# Patient Record
Sex: Female | Born: 1950 | Race: White | Hispanic: No | Smoking: Former smoker
Health system: Southern US, Community
[De-identification: ages and names within clinical notes are randomized; demographics above are authoritative.]

## PROBLEM LIST (undated history)

## (undated) DIAGNOSIS — R519 Headache, unspecified: Secondary | ICD-10-CM

## (undated) DIAGNOSIS — F419 Anxiety disorder, unspecified: Secondary | ICD-10-CM

## (undated) DIAGNOSIS — T753XXA Motion sickness, initial encounter: Secondary | ICD-10-CM

## (undated) DIAGNOSIS — C55 Malignant neoplasm of uterus, part unspecified: Secondary | ICD-10-CM

## (undated) DIAGNOSIS — R51 Headache: Secondary | ICD-10-CM

## (undated) DIAGNOSIS — F329 Major depressive disorder, single episode, unspecified: Secondary | ICD-10-CM

## (undated) DIAGNOSIS — G4730 Sleep apnea, unspecified: Secondary | ICD-10-CM

## (undated) DIAGNOSIS — R42 Dizziness and giddiness: Secondary | ICD-10-CM

## (undated) DIAGNOSIS — K649 Unspecified hemorrhoids: Secondary | ICD-10-CM

## (undated) DIAGNOSIS — F32A Depression, unspecified: Secondary | ICD-10-CM

## (undated) DIAGNOSIS — Z9289 Personal history of other medical treatment: Secondary | ICD-10-CM

## (undated) DIAGNOSIS — E119 Type 2 diabetes mellitus without complications: Secondary | ICD-10-CM

## (undated) DIAGNOSIS — K219 Gastro-esophageal reflux disease without esophagitis: Secondary | ICD-10-CM

## (undated) DIAGNOSIS — E042 Nontoxic multinodular goiter: Secondary | ICD-10-CM

## (undated) DIAGNOSIS — E7800 Pure hypercholesterolemia, unspecified: Secondary | ICD-10-CM

## (undated) DIAGNOSIS — D649 Anemia, unspecified: Secondary | ICD-10-CM

## (undated) HISTORY — PX: HERNIA REPAIR: SHX51

## (undated) HISTORY — PX: LAPAROSCOPIC SMALL BOWEL RESECTION: SUR793

## (undated) HISTORY — PX: VAGINAL HYSTERECTOMY: SUR661

## (undated) HISTORY — PX: BACK SURGERY: SHX140

## (undated) HISTORY — DX: Type 2 diabetes mellitus without complications: E11.9

## (undated) HISTORY — DX: Anxiety disorder, unspecified: F41.9

## (undated) HISTORY — PX: TONSILLECTOMY: SUR1361

## (undated) HISTORY — PX: ABDOMINAL HERNIA REPAIR: SHX539

## (undated) HISTORY — DX: Depression, unspecified: F32.A

## (undated) HISTORY — DX: Major depressive disorder, single episode, unspecified: F32.9

## (undated) HISTORY — PX: COLECTOMY: SHX59

---
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
## 1998-03-08 HISTORY — PX: GASTRIC BYPASS: SHX52

## 2003-12-18 ENCOUNTER — Emergency Department: Payer: Self-pay | Admitting: General Practice

## 2006-10-11 ENCOUNTER — Ambulatory Visit: Payer: Self-pay | Admitting: Gastroenterology

## 2008-03-08 HISTORY — PX: LUMBAR DISC SURGERY: SHX700

## 2011-05-20 ENCOUNTER — Emergency Department: Payer: Self-pay | Admitting: *Deleted

## 2011-05-20 LAB — CBC
MCH: 23.3 pg — ABNORMAL LOW (ref 26.0–34.0)
MCHC: 31.7 g/dL — ABNORMAL LOW (ref 32.0–36.0)
MCV: 73 fL — ABNORMAL LOW (ref 80–100)
Platelet: 280 10*3/uL (ref 150–440)
RBC: 5.04 10*6/uL (ref 3.80–5.20)
RDW: 17.4 % — ABNORMAL HIGH (ref 11.5–14.5)
WBC: 7.9 10*3/uL (ref 3.6–11.0)

## 2011-05-21 LAB — COMPREHENSIVE METABOLIC PANEL
Albumin: 3.7 g/dL (ref 3.4–5.0)
Alkaline Phosphatase: 92 U/L (ref 50–136)
BUN: 7 mg/dL (ref 7–18)
Bilirubin,Total: 0.5 mg/dL (ref 0.2–1.0)
Creatinine: 0.62 mg/dL (ref 0.60–1.30)
Glucose: 126 mg/dL — ABNORMAL HIGH (ref 65–99)
SGPT (ALT): 18 U/L
Total Protein: 8.2 g/dL (ref 6.4–8.2)

## 2011-06-12 ENCOUNTER — Ambulatory Visit: Payer: Self-pay | Admitting: Internal Medicine

## 2013-09-17 ENCOUNTER — Observation Stay: Payer: Self-pay | Admitting: Internal Medicine

## 2013-09-17 LAB — BASIC METABOLIC PANEL
Anion Gap: 6 — ABNORMAL LOW (ref 7–16)
BUN: 8 mg/dL (ref 7–18)
CALCIUM: 8.3 mg/dL — AB (ref 8.5–10.1)
CHLORIDE: 108 mmol/L — AB (ref 98–107)
Co2: 25 mmol/L (ref 21–32)
Creatinine: 0.63 mg/dL (ref 0.60–1.30)
EGFR (Non-African Amer.): >60
Glucose: 78 mg/dL (ref 65–99)
OSMOLALITY: 275 (ref 275–301)
Potassium: 4.2 mmol/L (ref 3.5–5.1)
SODIUM: 139 mmol/L (ref 136–145)

## 2013-09-17 LAB — CBC WITH DIFFERENTIAL/PLATELET
BASOS ABS: 0 10*3/uL (ref 0.0–0.1)
Basophil %: 0.3 %
Eosinophil #: 0.1 10*3/uL (ref 0.0–0.7)
Eosinophil %: 2 %
HCT: 36.4 % (ref 35.0–47.0)
HGB: 11.3 g/dL — AB (ref 12.0–16.0)
Lymphocyte #: 2.2 10*3/uL (ref 1.0–3.6)
Lymphocyte %: 31.1 %
MCH: 23.5 pg — ABNORMAL LOW (ref 26.0–34.0)
MCHC: 31.1 g/dL — ABNORMAL LOW (ref 32.0–36.0)
MCV: 76 fL — ABNORMAL LOW (ref 80–100)
Monocyte #: 0.6 x10 3/mm (ref 0.2–0.9)
Monocyte %: 8.3 %
NEUTROS PCT: 58.3 %
Neutrophil #: 4.2 10*3/uL (ref 1.4–6.5)
PLATELETS: 271 10*3/uL (ref 150–440)
RBC: 4.81 10*6/uL (ref 3.80–5.20)
RDW: 17.3 % — ABNORMAL HIGH (ref 11.5–14.5)
WBC: 7.2 10*3/uL (ref 3.6–11.0)

## 2013-09-17 LAB — TROPONIN I

## 2013-09-24 ENCOUNTER — Encounter: Payer: Self-pay | Admitting: Internal Medicine

## 2013-09-25 ENCOUNTER — Ambulatory Visit: Payer: Self-pay | Admitting: Family Medicine

## 2013-09-25 DIAGNOSIS — R262 Difficulty in walking, not elsewhere classified: Secondary | ICD-10-CM | POA: Insufficient documentation

## 2013-09-25 DIAGNOSIS — R42 Dizziness and giddiness: Secondary | ICD-10-CM | POA: Insufficient documentation

## 2013-09-25 DIAGNOSIS — H538 Other visual disturbances: Secondary | ICD-10-CM | POA: Insufficient documentation

## 2013-09-25 DIAGNOSIS — R200 Anesthesia of skin: Secondary | ICD-10-CM | POA: Insufficient documentation

## 2013-09-25 DIAGNOSIS — R51 Headache: Secondary | ICD-10-CM

## 2013-09-25 DIAGNOSIS — R270 Ataxia, unspecified: Secondary | ICD-10-CM | POA: Insufficient documentation

## 2013-09-25 DIAGNOSIS — M542 Cervicalgia: Secondary | ICD-10-CM | POA: Insufficient documentation

## 2013-09-25 DIAGNOSIS — R519 Headache, unspecified: Secondary | ICD-10-CM | POA: Insufficient documentation

## 2013-10-06 ENCOUNTER — Encounter: Payer: Self-pay | Admitting: Internal Medicine

## 2013-10-08 ENCOUNTER — Ambulatory Visit: Payer: Self-pay | Admitting: Family Medicine

## 2013-10-15 DIAGNOSIS — F341 Dysthymic disorder: Secondary | ICD-10-CM | POA: Insufficient documentation

## 2013-10-15 DIAGNOSIS — F32A Depression, unspecified: Secondary | ICD-10-CM | POA: Insufficient documentation

## 2013-10-15 DIAGNOSIS — E119 Type 2 diabetes mellitus without complications: Secondary | ICD-10-CM | POA: Insufficient documentation

## 2013-10-15 DIAGNOSIS — F329 Major depressive disorder, single episode, unspecified: Secondary | ICD-10-CM | POA: Insufficient documentation

## 2013-11-06 ENCOUNTER — Encounter: Payer: Self-pay | Admitting: Internal Medicine

## 2014-01-14 DIAGNOSIS — E042 Nontoxic multinodular goiter: Secondary | ICD-10-CM | POA: Insufficient documentation

## 2014-01-31 ENCOUNTER — Emergency Department: Payer: Self-pay | Admitting: Emergency Medicine

## 2014-02-01 LAB — URINALYSIS, COMPLETE
BILIRUBIN, UR: NEGATIVE
BLOOD: NEGATIVE
Bacteria: NONE SEEN
GLUCOSE, UR: NEGATIVE mg/dL (ref 0–75)
KETONE: NEGATIVE
LEUKOCYTE ESTERASE: NEGATIVE
Nitrite: NEGATIVE
PH: 6 (ref 4.5–8.0)
Protein: NEGATIVE
Specific Gravity: 1.014 (ref 1.003–1.030)

## 2014-02-01 LAB — CBC WITH DIFFERENTIAL/PLATELET
Basophil #: 0.1 10*3/uL (ref 0.0–0.1)
Basophil %: 0.6 %
Eosinophil #: 0.1 10*3/uL (ref 0.0–0.7)
Eosinophil %: 0.7 %
HCT: 38.9 % (ref 35.0–47.0)
HGB: 12.2 g/dL (ref 12.0–16.0)
Lymphocyte #: 0.4 10*3/uL — ABNORMAL LOW (ref 1.0–3.6)
Lymphocyte %: 3.4 %
MCH: 25.4 pg — AB (ref 26.0–34.0)
MCHC: 31.4 g/dL — AB (ref 32.0–36.0)
MCV: 81 fL (ref 80–100)
Monocyte #: 0.7 x10 3/mm (ref 0.2–0.9)
Monocyte %: 6.4 %
NEUTROS PCT: 88.9 %
Neutrophil #: 10 10*3/uL — ABNORMAL HIGH (ref 1.4–6.5)
Platelet: 216 10*3/uL (ref 150–440)
RBC: 4.8 10*6/uL (ref 3.80–5.20)
RDW: 14.4 % (ref 11.5–14.5)
WBC: 11.2 10*3/uL — ABNORMAL HIGH (ref 3.6–11.0)

## 2014-02-01 LAB — COMPREHENSIVE METABOLIC PANEL
ANION GAP: 10 (ref 7–16)
Albumin: 3.5 g/dL (ref 3.4–5.0)
Alkaline Phosphatase: 81 U/L
BUN: 13 mg/dL (ref 7–18)
Bilirubin,Total: 0.7 mg/dL (ref 0.2–1.0)
CALCIUM: 8.5 mg/dL (ref 8.5–10.1)
CO2: 23 mmol/L (ref 21–32)
CREATININE: 0.87 mg/dL (ref 0.60–1.30)
Chloride: 107 mmol/L (ref 98–107)
EGFR (Non-African Amer.): >60
Glucose: 146 mg/dL — ABNORMAL HIGH (ref 65–99)
OSMOLALITY: 282 (ref 275–301)
POTASSIUM: 3.5 mmol/L (ref 3.5–5.1)
SGOT(AST): 19 U/L (ref 15–37)
SGPT (ALT): 22 U/L
SODIUM: 140 mmol/L (ref 136–145)
TOTAL PROTEIN: 7 g/dL (ref 6.4–8.2)

## 2014-02-01 LAB — LIPASE, BLOOD: Lipase: 128 U/L (ref 73–393)

## 2014-06-29 NOTE — H&P (Signed)
PATIENT NAME:  Brooke, Liu MR#:  [id number] DATE OF BIRTH:  [birth year]  DATE OF ADMISSION:  09/17/2013  PRIMARY CARE PHYSICIAN:  Rochel Brome, MD, at [facility] practice.    CHIEF COMPLAINT: Unsteady gait, headache and dizziness.   HISTORY OF PRESENT ILLNESS: Brooke Liu is 64 year old Caucasian female with history of diabetes and mild depression, comes to the Emergency Room after she started having increasing unsteady gait, dizziness and severe headache after she had some dental work done due to her cavity today. The patient states she has been getting worked up as outpatient for headaches which have been there for 3 months along with some tinnitus in both her ears. She was also evaluated by ENT and was supposed to get MRI as outpatient, however, has not had a chance to do it. In the meantime, the patient started having pain in her tooth and she underwent some dental surgery today for tooth extraction today. The patient reports very wobbly, unsteady, was hitting into the walls at home when she was trying to walk. She was brought to the Emergency Room and complained of significant headache. She is hemodynamically stable. She is being admitted for further evaluation of her headache and ataxia.   ALLERGIES: INTRAVENOUS PYELOGRAM DYE.   PAST MEDICAL HISTORY:   1.  Diabetes.  2.  Headache for the last 3 months.  3.  Tinnitus for the last 2 to 3 months as well.  4.  Mild depression.     SOCIAL HISTORY: Nonsmoker, works as a Surveyor, minerals. Negative for alcohol or any drug use.   MEDICATIONS:  1.  Valium 5 mg p.o. daily as needed.  2.  Metformin 500 mg b.i.d.  3.  Lexapro 20 mg p.o. daily.   FAMILY HISTORY: Positive for diabetes, hypertension and cerebrovascular accident.  REVIEW OF SYSTEMS:   CONSTITUTIONAL: No fever. Positive for fatigue, weakness.  EYES: No blurred or double vision. No glaucoma or cataract.  EARS, NOSE, THROAT: Positive for tinnitus. No ear pain, hearing loss  or discharge.  RESPIRATORY: No cough, wheeze, hemoptysis or dyspnea.  CARDIOVASCULAR: No chest pain, orthopnea, edema or arrhythmia.  GASTROINTESTINAL: No nausea, vomiting, diarrhea, abdominal pain. No gastroesophageal reflux disease.  GENITOURINARY: No dysuria, hematuria or frequency.  ENDOCRINE: No polyuria, nocturia or thyroid problems.  HEMATOLOGY: No anemia, easy bruising or bleeding.  MUSCULOSKELETAL: Positive for arthritis.  NEUROLOGICAL:  Positive for ataxia and vertigo symptoms along with headache.  PSYCHIATRIC: No anxiety or bipolar disorder.  All other systems reviewed and negative.    PHYSICAL EXAMINATION: GENERAL: The patient is awake, alert, oriented x 3. She has mild to moderate distress due to headache.  VITAL SIGNS: Afebrile. Pulse is 64, blood pressure is 151/75, sats are 99% on room air.  HEENT: Atraumatic, normocephalic. Pupils are equal, round and reactive to light and accommodation, no nystagmus. EOM intact. Oral mucosa is moist.  NECK: Supple. No JVD. No carotid bruit.  RESPIRATORY:  Clear to auscultation bilaterally. No rales, rhonchi, respiratory distress or labored breathing.  CARDIOVASCULAR: Both heart sounds are normal. Rate, rhythm regular. PMI not lateralized. Chest nontender.  EXTREMITIES: Good pedal pulses, good femoral pulses. No lower extremity edema.  ABDOMEN: Soft, benign, nontender. No organomegaly. Positive bowel sounds.  NEUROLOGIC: Grossly intact cranial nerves II through XII. No motor or sensory deficit. The patient does have ataxic gait. I did not make her walk; however, was told by ER physician. Reflexes 1+ in both upper and lower extremities, plantars downgoing.  SKIN:  Warm and dry.  PSYCHIATRIC: The patient is awake, alert, oriented x 3.   LABORATORY, DIAGNOSTIC AND RADIOLOGICAL DATA:   1.  White count is 7.2, hemoglobin and hematocrit is 11.3 and 36.4.  2.  CT head is no specific intracranial abnormality to explain patient's symptoms,  degenerative spurring of both mandibular condyles.  3.  Glucose is 78, BUN 8, creatinine 0.63, sodium is 139, potassium is 4.2, chloride is 108, bicarbonate is 25. Troponin is less than 0.02.  4.  EKG shows normal sinus rhythm, low-voltage QRS.   ASSESSMENT: A 64 year old, Brooke Liu, with history of diabetes and mild depression, comes in with:  1.  Ataxic gait, headache and tinnitus. Differential includes vertigo versus possible small cerebellar stroke. The patient does have risk factors for cerebrovascular accident. I will hold off on giving aspirin since she had some dental work and tooth pulled out, however, we can start baby aspirin from tomorrow. Will give meclizine p.r.n. for vertigo symptoms. P.r.n. IV pain meds for headache. Will get MRI of the brain in the morning. Consider physical therapy consultation.  2.  Type 2 diabetes. Sliding scale and continue metformin.  3.  Mild depression. Continue Valium and Lexapro.  4.  Deep vein thrombosis prophylaxis. I will hold off on heparin given her dental work today. Will give thromboembolic disease stockings and sequential compression devices.   The above was discussed with the patient, the patient's daughter who is present in the Emergency Room. The patient is a full code.   TIME SPENT: 50 minutes.    ____________________________ Hart Rochester Posey Pronto, MD sap:cs D: 09/17/2013 20:13:17 ET T: 09/17/2013 20:38:42 ET JOB#: [id number]  cc: Koriana Stepien A. Posey Pronto, MD, <Dictator> Rochel Brome, MD Ilda Basset MD ELECTRONICALLY SIGNED 10/02/2013 15:35

## 2014-06-29 NOTE — Discharge Summary (Signed)
PATIENT NAME:  Brooke Liu, Brooke Liu MR#:  [id number] DATE OF BIRTH:  25-Jun-1950  DATE OF ADMISSION:  09/17/2013 DATE OF DISCHARGE:  09/18/2013  FINAL DIAGNOSES:  1.  Unsteady gait and headache.  2.  Diabetes.  3.  Depression.   MEDICATIONS ON DISCHARGE: Include a steroid pack given by her ear, nose and throat doctor; okay to take that, metformin 500 mg twice a day, Lexapro 20 mg daily, and Valium 5 mg once a day as needed.   DIET: Low-sodium, carbohydrate -controlled diet, regular consistency.  FOLLOW UP:  Can follow up with [facility] neurology, outpatient vestibular therapy 1 to 2 weeks with Dr. Keith Rake.   HOSPITAL COURSE: The patient was admitted with unsteady gait, headache, and dizziness, admitted as an observation. MRI of the brain was ordered. Physical therapy did see the patient and recommended vestibular therapy. Laboratory and radiological data during the hospital course included a troponin that was negative, glucose 78, BUN 8, creatinine 0.63, sodium 139, potassium 4.2, chloride 108, CO2 25, calcium 8.3, white blood cell count 7.2, hemoglobin and hematocrit 11.3 and 36.4, and platelet count of 271,000.  EKG: Normal sinus rhythm, low voltage.  CT scan of the head negative.  MRI of the brain include mild atrophy, white matter disease, slightly advanced for age.  Finding is nonspecific, but can be seen in the setting of chronic microvascular ischemia, demyelinating process, vasculitis, complicated migraine, or sequelae of prior infectious or inflammatory process. No acute abnormality seen.   HOSPITAL COURSE PER PROBLEM LIST:  1.  The patient's unsteady gait and headache. Patient it was recommended vestibular therapy by physical therapy. MRI of the brain was negative for acute event. The patient was given a script for walker, out of work for one week, and follow-up with Dr. Keith Rake. I did give the okay to go ahead on the steroid pack that she was prescribed by ENT. This may help  out with her headache and also if this is the neck-related, if symptoms do not improve, can consider an MRI of the cervical spine and/or neurology follow up.   2.  Diabetes. Follow up with metformin.   3.  Depression on Lexapro.  TIME SPENT ON DISCHARGE: 40 minutes.    ____________________________ Tana Conch. Leslye Peer, MD rjw:ts D: 09/18/2013 12:59:47 ET T: 09/18/2013 13:11:23 ET JOB#: [id number]  cc: Tana Conch. Leslye Peer, MD, <Dictator> Marisue Brooklyn MD ELECTRONICALLY SIGNED 09/20/2013 12:40

## 2014-08-16 ENCOUNTER — Other Ambulatory Visit: Payer: Self-pay

## 2014-08-16 NOTE — Telephone Encounter (Signed)
Requested from patients pharm

## 2014-09-04 ENCOUNTER — Other Ambulatory Visit: Payer: Self-pay | Admitting: Family Medicine

## 2014-09-04 DIAGNOSIS — J302 Other seasonal allergic rhinitis: Secondary | ICD-10-CM

## 2014-09-04 MED ORDER — DESLORATADINE 5 MG PO TABS
5.0000 mg | ORAL_TABLET | Freq: Every day | ORAL | Status: DC
Start: 2014-09-04 — End: 2014-10-02

## 2014-09-05 ENCOUNTER — Other Ambulatory Visit: Payer: Self-pay | Admitting: Family Medicine

## 2014-09-05 ENCOUNTER — Telehealth: Payer: Self-pay | Admitting: Family Medicine

## 2014-09-05 ENCOUNTER — Ambulatory Visit: Payer: Self-pay | Admitting: Psychiatry

## 2014-09-05 ENCOUNTER — Ambulatory Visit: Payer: Self-pay | Admitting: Family Medicine

## 2014-09-05 ENCOUNTER — Telehealth: Payer: Self-pay | Admitting: Psychiatry

## 2014-09-05 DIAGNOSIS — E119 Type 2 diabetes mellitus without complications: Secondary | ICD-10-CM | POA: Insufficient documentation

## 2014-09-05 MED ORDER — METFORMIN HCL 500 MG PO TABS: 500.0000 mg | ORAL_TABLET | Freq: Two times a day (BID) | ORAL | Status: DC

## 2014-09-05 MED ORDER — ESCITALOPRAM OXALATE 20 MG PO TABS: 20.0000 mg | ORAL_TABLET | Freq: Every day | ORAL | Status: DC

## 2014-09-05 NOTE — Telephone Encounter (Signed)
Pt is unable to make appt on 09/05/14. She was wondering if she could get a refill on her Clarinex and Metformin. Pt states that she call the pharmacy to get a refill and they sent the request over twice, with no response. She uses the [facility] on [address]. Pt resched appt for 09/16/14.

## 2014-09-05 NOTE — Progress Notes (Signed)
Do we want to give the pt enough refills to last until her appt on 09/16/14, or do we want the pt to be seen prior to providing any refills??

## 2014-09-05 NOTE — Progress Notes (Signed)
Amber, will you please call and schedule this one , per Dr Trena Platt note.

## 2014-09-05 NOTE — Telephone Encounter (Signed)
Sent script request to Dr Manuella Ghazi

## 2014-09-05 NOTE — Telephone Encounter (Signed)
Patient canceled her appointment today. She indicated she had surgery and her ride was unable to drive her today. She requested a refill be sent of her medication Lexapro. We'll send in Lexapro 20 mg, #30 no refills. Patient has appointment scheduled for the end of July.

## 2014-09-06 NOTE — Progress Notes (Signed)
Done 09-05-14

## 2014-09-16 ENCOUNTER — Ambulatory Visit: Payer: Self-pay | Admitting: Family Medicine

## 2014-09-25 ENCOUNTER — Ambulatory Visit (INDEPENDENT_AMBULATORY_CARE_PROVIDER_SITE_OTHER): Payer: BLUE CROSS/BLUE SHIELD | Admitting: Psychiatry

## 2014-09-25 ENCOUNTER — Encounter: Payer: Self-pay | Admitting: Psychiatry

## 2014-09-25 VITALS — BP 124/82 | HR 74 | Temp 97.9°F | Ht 61.5 in | Wt 171.4 lb

## 2014-09-25 DIAGNOSIS — Z1389 Encounter for screening for other disorder: Secondary | ICD-10-CM | POA: Insufficient documentation

## 2014-09-25 DIAGNOSIS — Z23 Encounter for immunization: Secondary | ICD-10-CM | POA: Insufficient documentation

## 2014-09-25 DIAGNOSIS — F331 Major depressive disorder, recurrent, moderate: Secondary | ICD-10-CM | POA: Diagnosis not present

## 2014-09-25 DIAGNOSIS — L309 Dermatitis, unspecified: Secondary | ICD-10-CM | POA: Insufficient documentation

## 2014-09-25 DIAGNOSIS — I6529 Occlusion and stenosis of unspecified carotid artery: Secondary | ICD-10-CM | POA: Insufficient documentation

## 2014-09-25 DIAGNOSIS — R058 Other specified cough: Secondary | ICD-10-CM | POA: Insufficient documentation

## 2014-09-25 DIAGNOSIS — R05 Cough: Secondary | ICD-10-CM | POA: Insufficient documentation

## 2014-09-25 DIAGNOSIS — J302 Other seasonal allergic rhinitis: Secondary | ICD-10-CM | POA: Insufficient documentation

## 2014-09-25 DIAGNOSIS — I10 Essential (primary) hypertension: Secondary | ICD-10-CM | POA: Insufficient documentation

## 2014-09-25 MED ORDER — BUPROPION HCL ER (XL) 150 MG PO TB24: 150.0000 mg | ORAL_TABLET | ORAL | Status: DC

## 2014-09-25 MED ORDER — HYDROXYZINE PAMOATE 25 MG PO CAPS
25.0000 mg | ORAL_CAPSULE | Freq: Every evening | ORAL | Status: DC | PRN
Start: 2014-09-25 — End: 2014-10-02

## 2014-09-25 NOTE — Progress Notes (Signed)
BH MD/PA/NP OP Progress Note  09/25/2014 2:55 PM Brooke Liu  MRN:  [id number]  Subjective:  Patient returns for follow-up of her major depressive disorder and generalized anxiety disorder. She states that since her last visit she has noted she just does not have motivation, drive or interest in doing anything. We discussed that she has now been on the Lexapro for approximately a year. She discussed that her sister has done very well on Wellbutrin. She states she does still engage some with her granddaughters. At this time she is recovering from surgery on her lower limb. He relates that her appetite has been good and, so perhaps it is been too much. She states that her sleep continues to be a problem and she just sits there awake. She states that she does not use this and Xanax very often for sleep and thus has an adequate supply of this. She indicates she took melatonin but was concerned about some of the side effects there. She states she had some success with NyQuil. She had a prior trial of Vistaril back in February but reported that it made her too sedated at the 50 mg at bedtime dose. We decided she could try 25 mg dose to see if this helped her. Chief Complaint:  don't want to do anything Visit Diagnosis:  No diagnosis found.  Past Medical History: No past medical history on file. No past surgical history on file. Family History: No family history on file. Social History:  History   Social History  . Marital Status: Widowed    Spouse Name: N/A  . Number of Children: N/A  . Years of Education: N/A   Social History Main Topics  . Smoking status: Not on file  . Smokeless tobacco: Not on file  . Alcohol Use: Not on file  . Drug Use: Not on file  . Sexual Activity: Not on file   Other Topics Concern  . Not on file   Social History Narrative  . No narrative on file   Additional History:   Assessment:   Musculoskeletal: Strength & Muscle Tone: within normal  limits Gait & Station: Slow and ambulating with one cane Patient leans: N/A  Psychiatric Specialty Exam: HPI  Review of Systems  Psychiatric/Behavioral: Positive for depression. Negative for suicidal ideas, hallucinations, memory loss and substance abuse. The patient has insomnia. The patient is not nervous/anxious.     There were no vitals taken for this visit.There is no height or weight on file to calculate BMI.  General Appearance: Well Groomed  Eye Contact:  Good  Speech:  Clear and Coherent and Normal Rate  Volume:  Normal  Mood:  Okay  Affect:  Constricted  Thought Process:  Linear and Logical  Orientation:  Full (Time, Place, and Person)  Thought Content:  Negative  Suicidal Thoughts:  No  Homicidal Thoughts:  No  Memory:  Immediate;   Good Recent;   Good Remote;   Good  Judgement:  Good  Insight:  Good  Psychomotor Activity:  Normal  Concentration:  Good  Recall:  Good  Fund of Knowledge: Good  Language: Good  Akathisia:  Negative  Handed:  Right unknown   AIMS (if indicated):  N/A  Assets:  Communication Skills Desire for Improvement Others:  Involvement with grandchildren  ADL's:  Intact  Cognition: WNL  Sleep:  Poor    Is the patient at risk to self?  No. Has the patient been a risk to self in the past  6 months?  No. Has the patient been a risk to self within the distant past?  No. Is the patient a risk to others?  No. Has the patient been a risk to others in the past 6 months?  No. Has the patient been a risk to others within the distant past?  No.  Current Medications: Current Outpatient Prescriptions  Medication Sig Dispense Refill  . desloratadine (CLARINEX) 5 MG tablet Take 1 tablet (5 mg total) by mouth daily. 30 tablet 2  . escitalopram (LEXAPRO) 20 MG tablet Take 1 tablet (20 mg total) by mouth daily. 30 tablet 0  . metFORMIN (GLUCOPHAGE) 500 MG tablet Take 1 tablet (500 mg total) by mouth 2 (two) times daily with a meal. 30 tablet 0   No  current facility-administered medications for this visit.    Medical Decision Making:  Established Problem, Stable/Improving (1), Review of Medication Regimen & Side Effects (2) and Review of New Medication or Change in Dosage (2)  Treatment Plan Summary:Medication management and Plan I instructed patient to decrease her citalopram dose from 20 mg to 10 mg for 7 days and she will take the 10 mg daily until 10/02/2014. On 10/03/2014 she will start her Wellbutrin XL 150 mg. Risk and benefits of been discussing patient's able consent. I will provide a prescription for Vistaril 25 mg at bedtime as needed for insomnia. Patient will follow up in 1 month.  He is using her Xanax rarely and does not need a prescription for this today.   Faith Rogue 09/25/2014, 2:55 PM

## 2014-09-27 ENCOUNTER — Telehealth: Payer: Self-pay | Admitting: Family Medicine

## 2014-09-27 DIAGNOSIS — E119 Type 2 diabetes mellitus without complications: Secondary | ICD-10-CM

## 2014-09-27 MED ORDER — METFORMIN HCL 500 MG PO TABS: 500.0000 mg | ORAL_TABLET | Freq: Two times a day (BID) | ORAL | Status: DC

## 2014-09-27 NOTE — Telephone Encounter (Signed)
Pt had appointment and missed it, however she did resch for this coming Wednesday. She is completely out of metformin 500mg  2x daily. The pharmacy is going to give her 3pills but it will not last through the week end. Please send a prescription to [facility]. Please call once completed

## 2014-09-27 NOTE — Telephone Encounter (Signed)
Medication has been refilled and sent to [facility]. Patient has scheduled appt for 10/02/2014

## 2014-10-02 ENCOUNTER — Encounter: Payer: Self-pay | Admitting: Family Medicine

## 2014-10-02 ENCOUNTER — Ambulatory Visit (INDEPENDENT_AMBULATORY_CARE_PROVIDER_SITE_OTHER): Payer: BLUE CROSS/BLUE SHIELD | Admitting: Family Medicine

## 2014-10-02 VITALS — BP 128/69 | HR 81 | Temp 98.2°F | Resp 18 | Ht 62.0 in | Wt 174.7 lb

## 2014-10-02 DIAGNOSIS — L309 Dermatitis, unspecified: Secondary | ICD-10-CM | POA: Diagnosis not present

## 2014-10-02 DIAGNOSIS — D649 Anemia, unspecified: Secondary | ICD-10-CM

## 2014-10-02 DIAGNOSIS — E042 Nontoxic multinodular goiter: Secondary | ICD-10-CM | POA: Diagnosis not present

## 2014-10-02 DIAGNOSIS — E785 Hyperlipidemia, unspecified: Secondary | ICD-10-CM | POA: Diagnosis not present

## 2014-10-02 DIAGNOSIS — J302 Other seasonal allergic rhinitis: Secondary | ICD-10-CM

## 2014-10-02 DIAGNOSIS — E119 Type 2 diabetes mellitus without complications: Secondary | ICD-10-CM

## 2014-10-02 MED ORDER — METFORMIN HCL 500 MG PO TABS: 500.0000 mg | ORAL_TABLET | Freq: Two times a day (BID) | ORAL | Status: DC

## 2014-10-02 MED ORDER — DESLORATADINE 5 MG PO TABS: 5.0000 mg | ORAL_TABLET | Freq: Every day | ORAL | Status: DC

## 2014-10-02 MED ORDER — TRIAMCINOLONE ACETONIDE 0.025 % EX OINT: TOPICAL_OINTMENT | Freq: Two times a day (BID) | CUTANEOUS | Status: DC

## 2014-10-02 NOTE — Progress Notes (Signed)
Name: Brooke Liu   MRN: [id number]    DOB: [birth year]   Date:10/02/2014       Progress Note  Subjective  Chief Complaint  Chief Complaint  Patient presents with  . Medication Refill  . Diabetes  . Anemia    Diabetes She presents for her follow-up diabetic visit. She has type 2 diabetes mellitus. Her disease course has been worsening. Pertinent negatives for diabetes include no fatigue, no foot paresthesias, no polydipsia and no polyuria. Current diabetic treatment includes oral agent (monotherapy). Her breakfast blood glucose is taken between 7-8 am. Her breakfast blood glucose range is generally >200 mg/dl.  Anemia Presents for follow-up visit. Signs of blood loss that are not present include hematemesis, hematochezia, melena and vaginal bleeding. Past treatments include oral iron supplements.  Thyroid Nodules Pt. Has history of thyroid nodules observed on a thyroid Ultrasound in August 2015. She has been seen by Endocrinology at [facility] and close follow up was recommended but the endocrinologist did not perform any procedures. She wishes to be referred to Dr. Eddie Dibbles in [place].   Past Medical History  Diagnosis Date  . Anxiety   . Depression   . Cancer   . Diabetes mellitus, type II     Past Surgical History  Procedure Laterality Date  . Abdominal hysterectomy    . Cholecystectomy    . Cesarean section    . Gastric bypass    . Hernia repair    . Small bowel repair      Family History  Problem Relation Age of Onset  . Depression Mother   . Diabetes Mother   . Heart disease Mother   . Heart disease Father   . Diabetes Sister   . Diabetes Brother   . Dementia Brother   . Heart disease Brother   . Diabetes Sister   . Fibromyalgia Sister   . Diabetes Brother   . Heart disease Brother   . Diabetes Brother   . Heart disease Brother   . Leukemia Brother   . Cancer Brother   . Liver disease Brother   . Alcohol abuse Brother   . Throat cancer Brother   .  Heart disease Brother   . Diabetes Brother     History   Social History  . Marital Status: Widowed    Spouse Name: N/A  . Number of Children: N/A  . Years of Education: N/A   Occupational History  . Not on file.   Social History Main Topics  . Smoking status: Former Smoker    Types: Cigarettes    Quit date: 09/24/1977  . Smokeless tobacco: Never Used  . Alcohol Use: No  . Drug Use: No  . Sexual Activity: No   Other Topics Concern  . Not on file   Social History Narrative     Current outpatient prescriptions:  .  acetaminophen (TYLENOL) 325 MG tablet, Take by mouth., Disp: , Rfl:  .  ALPRAZolam (XANAX) 0.25 MG tablet, , Disp: , Rfl: 1 .  buPROPion (WELLBUTRIN XL) 150 MG 24 hr tablet, Take 1 tablet (150 mg total) by mouth every morning. Start on 10/03/14 after off escitalopram., Disp: 30 tablet, Rfl: 1 .  Cholecalciferol (VITAMIN D-1000 MAX ST) 1000 UNITS tablet, Take by mouth., Disp: , Rfl:  .  desloratadine (CLARINEX) 5 MG tablet, Take 1 tablet (5 mg total) by mouth daily., Disp: 30 tablet, Rfl: 2 .  escitalopram (LEXAPRO) 20 MG tablet, Take 1 tablet (20 mg total) by  mouth daily., Disp: 30 tablet, Rfl: 0 .  fluticasone (FLONASE) 50 MCG/ACT nasal spray, , Disp: , Rfl: 0 .  hydrOXYzine (VISTARIL) 25 MG capsule, , Disp: , Rfl: 0 .  IRON PO, Take 325 mg by mouth., Disp: , Rfl:  .  Loratadine 10 MG CAPS, Take by mouth., Disp: , Rfl:  .  metFORMIN (GLUCOPHAGE) 1000 MG tablet, , Disp: , Rfl: 2 .  metFORMIN (GLUCOPHAGE) 500 MG tablet, Take 1 tablet (500 mg total) by mouth 2 (two) times daily with a meal., Disp: 30 tablet, Rfl: 0 .  Multiple Vitamin (MULTI-VITAMINS) TABS, Take by mouth., Disp: , Rfl:  .  triamcinolone (KENALOG) 0.025 % ointment, TRIAMCINOLONE ACETONIDE, 0.025% (External Ointment)  1 (one) Ointment two times daily, as needed for 5 days  Quantity: 1;  Refills: 0   Ordered :28-Mar-2014  Rochel Brome MD;  Started 28-Mar-2014 Active Comments: Medication taken as  needed. , Disp: , Rfl:   Allergies  Allergen Reactions  . Iodinated Diagnostic Agents     MRI Dye     Review of Systems  Constitutional: Negative for fatigue.  Gastrointestinal: Negative for melena, hematochezia and hematemesis.  Genitourinary: Negative for vaginal bleeding.  Endo/Heme/Allergies: Negative for polydipsia.      Objective  Filed Vitals:   10/02/14 1040  BP: 128/69  Pulse: 81  Temp: 98.2 F (36.8 C)  TempSrc: Oral  Resp: 18  Height: 5\' 2"  (1.575 m)  Weight: 174 lb 11.2 oz (79.243 kg)  SpO2: 97%    Physical Exam  Constitutional: She is well-developed, well-nourished, and in no distress.  HENT:  Head: Normocephalic and atraumatic.  Cardiovascular: Normal rate and regular rhythm.   Pulmonary/Chest: Effort normal and breath sounds normal.  Skin:     Dry, macular, pruritic rash over the palm of right hand, sometimes, it forms blisters.  Nursing note and vitals reviewed.    Assessment & Plan 1. Dermatitis, eczematoid Refill prescription for Kenalog ointment for eczema. - triamcinolone (KENALOG) 0.025 % ointment; Apply topically 2 (two) times daily.  Dispense: 15 g; Refill: 2  2. Multiple thyroid nodules Pt. will be referred to Dr. Eddie Dibbles for evaluation of thyroid nodules. She does not wish to go back to the endocrinologist at [facility]. - Ambulatory referral to Endocrinology  3. Type 2 diabetes mellitus without complication Recheck D4Y and lipid panel and follow-up. - HgB A1c - Lipid Profile - metFORMIN (GLUCOPHAGE) 500 MG tablet; Take 1 tablet (500 mg total) by mouth 2 (two) times daily with a meal.  Dispense: 60 tablet; Refill: 2  4. Anemia, unspecified anemia type Repeat CBC and follow-up today. - CBC w/Diff  5. Seasonal allergies Refilled prescription for Clarinex 5 mg daily for seasonal allergies - desloratadine (CLARINEX) 5 MG tablet; Take 1 tablet (5 mg total) by mouth daily.  Dispense: 30 tablet; Refill: 2  6.  Dyslipidemia Recheck fasting lipid panel and consider starting her on a statin therapy. - Lipid Profile - Comprehensive metabolic panel   Sudiksha Victor Asad A. [facility] 10/02/2014 11:22 AM

## 2014-10-03 ENCOUNTER — Other Ambulatory Visit: Payer: Self-pay | Admitting: Family Medicine

## 2014-10-03 LAB — CBC WITH DIFFERENTIAL/PLATELET
Basophils Absolute: 0 10*3/uL (ref 0.0–0.2)
Basos: 1 %
EOS (ABSOLUTE): 0.3 10*3/uL (ref 0.0–0.4)
Eos: 3 %
HEMATOCRIT: 40.6 % (ref 34.0–46.6)
HEMOGLOBIN: 13.1 g/dL (ref 11.1–15.9)
IMMATURE GRANULOCYTES: 0 %
Immature Grans (Abs): 0 10*3/uL (ref 0.0–0.1)
LYMPHS ABS: 2.3 10*3/uL (ref 0.7–3.1)
Lymphs: 29 %
MCH: 25.3 pg — AB (ref 26.6–33.0)
MCHC: 32.3 g/dL (ref 31.5–35.7)
MCV: 79 fL (ref 79–97)
MONOCYTES: 9 %
Monocytes Absolute: 0.7 10*3/uL (ref 0.1–0.9)
Neutrophils Absolute: 4.6 10*3/uL (ref 1.4–7.0)
Neutrophils: 58 %
PLATELETS: 331 10*3/uL (ref 150–379)
RBC: 5.17 x10E6/uL (ref 3.77–5.28)
RDW: 16.3 % — AB (ref 12.3–15.4)
WBC: 7.8 10*3/uL (ref 3.4–10.8)

## 2014-10-03 LAB — COMPREHENSIVE METABOLIC PANEL
ALK PHOS: 93 IU/L (ref 39–117)
ALT: 15 IU/L (ref 0–32)
AST: 19 IU/L (ref 0–40)
Albumin/Globulin Ratio: 1.6 (ref 1.1–2.5)
Albumin: 4.6 g/dL (ref 3.6–4.8)
BILIRUBIN TOTAL: 0.4 mg/dL (ref 0.0–1.2)
BUN/Creatinine Ratio: 17 (ref 11–26)
BUN: 11 mg/dL (ref 8–27)
CO2: 23 mmol/L (ref 18–29)
Calcium: 9.1 mg/dL (ref 8.7–10.3)
Chloride: 98 mmol/L (ref 97–108)
Creatinine, Ser: 0.66 mg/dL (ref 0.57–1.00)
GFR calc Af Amer: 109 mL/min/{1.73_m2} (ref >59)
GFR, EST NON AFRICAN AMERICAN: 95 mL/min/{1.73_m2} (ref >59)
Globulin, Total: 2.9 g/dL (ref 1.5–4.5)
Glucose: 88 mg/dL (ref 65–99)
Potassium: 5 mmol/L (ref 3.5–5.2)
Sodium: 138 mmol/L (ref 134–144)
Total Protein: 7.5 g/dL (ref 6.0–8.5)

## 2014-10-03 LAB — LIPID PANEL
CHOL/HDL RATIO: 2.8 ratio (ref 0.0–4.4)
Cholesterol, Total: 202 mg/dL — ABNORMAL HIGH (ref 100–199)
HDL: 71 mg/dL (ref >39)
LDL Calculated: 106 mg/dL — ABNORMAL HIGH (ref 0–99)
Triglycerides: 126 mg/dL (ref 0–149)
VLDL Cholesterol Cal: 25 mg/dL (ref 5–40)

## 2014-10-03 LAB — HEMOGLOBIN A1C
Est. average glucose Bld gHb Est-mCnc: 126 mg/dL
Hgb A1c MFr Bld: 6 % — ABNORMAL HIGH (ref 4.8–5.6)

## 2014-10-09 ENCOUNTER — Other Ambulatory Visit: Payer: Self-pay | Admitting: Internal Medicine

## 2014-10-09 DIAGNOSIS — E042 Nontoxic multinodular goiter: Secondary | ICD-10-CM

## 2014-10-21 ENCOUNTER — Ambulatory Visit: Payer: BLUE CROSS/BLUE SHIELD

## 2014-10-24 ENCOUNTER — Encounter: Payer: Self-pay | Admitting: Family Medicine

## 2014-10-24 ENCOUNTER — Telehealth: Payer: Self-pay | Admitting: Family Medicine

## 2014-10-24 NOTE — Telephone Encounter (Signed)
PT IS OUT OF TOWN IN [place] AND SHE HAS A REAL BAD SINUS INFECTION. PHARM IS [facility] ON [address] IN [place]. SHE IS THERE VISITING HER GRAND CHILDREN FOR ABOUT A WEEK. CALL HER AT [phone]

## 2014-10-24 NOTE — Telephone Encounter (Signed)
Routed to Dr. Manuella Ghazi to prescribe a medication for Sinus infection

## 2014-10-24 NOTE — Telephone Encounter (Signed)
Called patient and she is raising symptoms of runny nose with dark-colored mucus, sneezing, and watery eyes and thinks that she may have at sinus infection. Symptom onset was 4 days ago but symptoms of gotten worse progressively and today she feels very sick. She is currently in [place] medication and is requesting that an antibiotic be called into her local pharmacy. I have asked patient to be seen and evaluated at an Urgent Care as soon as possible. Patient verbalized understanding.

## 2014-10-25 NOTE — Telephone Encounter (Signed)
Dr. Manuella Ghazi has spoken to patient and recommended that patient be seen at an Urgent Care for evaluation and treatment of symptoms.

## 2014-10-29 ENCOUNTER — Encounter: Payer: Self-pay | Admitting: Family Medicine

## 2014-10-29 ENCOUNTER — Other Ambulatory Visit: Payer: Self-pay

## 2014-10-29 ENCOUNTER — Ambulatory Visit (INDEPENDENT_AMBULATORY_CARE_PROVIDER_SITE_OTHER): Payer: BLUE CROSS/BLUE SHIELD | Admitting: Family Medicine

## 2014-10-29 ENCOUNTER — Encounter: Payer: Self-pay | Admitting: Psychiatry

## 2014-10-29 ENCOUNTER — Telehealth: Payer: Self-pay

## 2014-10-29 ENCOUNTER — Ambulatory Visit (INDEPENDENT_AMBULATORY_CARE_PROVIDER_SITE_OTHER): Payer: BLUE CROSS/BLUE SHIELD | Admitting: Psychiatry

## 2014-10-29 VITALS — BP 114/82 | HR 76 | Temp 97.5°F | Ht 60.0 in | Wt 172.4 lb

## 2014-10-29 VITALS — BP 120/77 | HR 114 | Temp 98.2°F | Resp 20 | Ht 62.0 in | Wt 173.2 lb

## 2014-10-29 DIAGNOSIS — F331 Major depressive disorder, recurrent, moderate: Secondary | ICD-10-CM | POA: Diagnosis not present

## 2014-10-29 DIAGNOSIS — J019 Acute sinusitis, unspecified: Secondary | ICD-10-CM

## 2014-10-29 DIAGNOSIS — L309 Dermatitis, unspecified: Secondary | ICD-10-CM | POA: Diagnosis not present

## 2014-10-29 DIAGNOSIS — F411 Generalized anxiety disorder: Secondary | ICD-10-CM | POA: Diagnosis not present

## 2014-10-29 MED ORDER — BUPROPION HCL ER (XL) 150 MG PO TB24: 150.0000 mg | ORAL_TABLET | ORAL | Status: DC

## 2014-10-29 MED ORDER — ALPRAZOLAM 0.25 MG PO TABS: 0.2500 mg | ORAL_TABLET | Freq: Two times a day (BID) | ORAL | Status: DC | PRN

## 2014-10-29 MED ORDER — ALPRAZOLAM 0.25 MG PO TABS: 0.2500 mg | ORAL_TABLET | Freq: Every evening | ORAL | Status: DC | PRN

## 2014-10-29 MED ORDER — AZITHROMYCIN 250 MG PO TABS: 250.0000 mg | ORAL_TABLET | Freq: Every day | ORAL | Status: DC

## 2014-10-29 MED ORDER — ESCITALOPRAM OXALATE 10 MG PO TABS: 10.0000 mg | ORAL_TABLET | Freq: Every day | ORAL | Status: DC

## 2014-10-29 MED ORDER — MOMETASONE FUROATE 0.1 % EX OINT: TOPICAL_OINTMENT | Freq: Every day | CUTANEOUS | Status: DC

## 2014-10-29 NOTE — Progress Notes (Signed)
BH MD/PA/NP OP Progress Note  10/29/2014 12:06 PM Brooke Liu  MRN:  [id number]  Subjective:  Patient returns for follow-up of her major depressive disorder and generalized anxiety disorder. She states she does feel her mood has improved with the Wellbutrin. She states she is more energetic, more outgoing as more initiative about things. However she states that the Wellbutrin has not helped her anxiety. She states initially she felt the Wellbutrin was causing problems with her anxiety however she states there are multiple stressors going on in her life which she believes are now the main cause of her anxiety. She states that they're dealing with a brother with Alzheimer's and she is having to interact with family members about decision making. She states that with the Lexapro she's felt she was calmer and her anxiety was better.  She brought up at the end of the appointment that she has had some night sweats and believes it is related to the Wellbutrin. We have decided to continue the Wellbutrin and see if this decreases her goes away given that she is having benefit from the medication. 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  Past Medical History:  Past Medical History  Diagnosis Date  . Anxiety   . Depression   . Cancer   . Diabetes mellitus, type II     Past Surgical History  Procedure Laterality Date  . Abdominal hysterectomy    . Cholecystectomy    . Cesarean section    . Gastric bypass    . Hernia repair    . Small bowel repair     Family History:  Family History  Problem Relation Age of Onset  . Depression Mother   . Diabetes Mother   . Heart disease Mother   . Heart disease Father   . Diabetes Sister   . Diabetes Brother   . Dementia Brother   . Heart disease Brother   . Diabetes Sister   . Fibromyalgia Sister   . Diabetes  Brother   . Heart disease Brother   . Diabetes Brother   . Heart disease Brother   . Leukemia Brother   . Cancer Brother   . Liver disease Brother   . Alcohol abuse Brother   . Throat cancer Brother   . Heart disease Brother   . Diabetes Brother    Social History:  Social History   Social History  . Marital Status: Widowed    Spouse Name: N/A  . Number of Children: N/A  . Years of Education: N/A   Social History Main Topics  . Smoking status: Former Smoker    Types: Cigarettes    Quit date: 09/24/1977  . Smokeless tobacco: Never Used  . Alcohol Use: No  . Drug Use: No  . Sexual Activity: No   Other Topics Concern  . None   Social History Narrative   Additional History:   Assessment:   Musculoskeletal: Strength & Muscle Tone: within normal limits Gait & Station: Slow and ambulating with one cane Patient leans: N/A  Psychiatric Specialty Exam: Anxiety Symptoms include insomnia and nervous/anxious behavior. Patient reports no suicidal ideas.      Review of Systems  Psychiatric/Behavioral: Negative for depression, suicidal ideas, hallucinations, memory loss and substance abuse. The patient is nervous/anxious and has insomnia.     Blood pressure 114/82, pulse 76, temperature 97.5 F (36.4  C), temperature source Tympanic, height 5' (1.524 m), weight 172 lb 6.4 oz (78.2 kg), SpO2 96 %.Body mass index is 33.67 kg/(m^2).  General Appearance: Well Groomed  Eye Contact:  Good  Speech:  Clear and Coherent and Normal Rate  Volume:  Normal  Mood:  Better  Affect:  Constricted  Thought Process:  Linear and Logical  Orientation:  Full (Time, Place, and Person)  Thought Content:  Negative  Suicidal Thoughts:  No  Homicidal Thoughts:  No  Memory:  Immediate;   Good Recent;   Good Remote;   Good  Judgement:  Good  Insight:  Good  Psychomotor Activity:  Normal  Concentration:  Good  Recall:  Good  Fund of Knowledge: Good  Language: Good  Akathisia:  Negative   Handed:  Right unknown   AIMS (if indicated):  N/A  Assets:  Communication Skills Desire for Improvement Others:  Involvement with grandchildren  ADL's:  Intact  Cognition: WNL  Sleep:  Poor    Is the patient at risk to self?  No. Has the patient been a risk to self in the past 6 months?  No. Has the patient been a risk to self within the distant past?  No. Is the patient a risk to others?  No. Has the patient been a risk to others in the past 6 months?  No. Has the patient been a risk to others within the distant past?  No.  Current Medications: Current Outpatient Prescriptions  Medication Sig Dispense Refill  . acetaminophen (TYLENOL) 325 MG tablet Take by mouth.    . ALPRAZolam (XANAX) 0.25 MG tablet Take 1 tablet (0.25 mg total) by mouth at bedtime as needed for anxiety. 30 tablet 3  . ALPRAZolam (XANAX) 0.25 MG tablet Take 1 tablet (0.25 mg total) by mouth at bedtime as needed for anxiety. 30 tablet 3  . buPROPion (WELLBUTRIN XL) 150 MG 24 hr tablet Take 1 tablet (150 mg total) by mouth every morning. 30 tablet 3  . Cholecalciferol (VITAMIN D-1000 MAX ST) 1000 UNITS tablet Take by mouth.    . desloratadine (CLARINEX) 5 MG tablet Take 1 tablet (5 mg total) by mouth daily. 30 tablet 2  . escitalopram (LEXAPRO) 10 MG tablet Take 1 tablet (10 mg total) by mouth daily. 30 tablet 3  . escitalopram (LEXAPRO) 20 MG tablet Take 1 tablet (20 mg total) by mouth daily. 30 tablet 0  . fluticasone (FLONASE) 50 MCG/ACT nasal spray   0  . hydrOXYzine (VISTARIL) 25 MG capsule   0  . IRON PO Take 325 mg by mouth.    . Loratadine 10 MG CAPS Take by mouth.    . metFORMIN (GLUCOPHAGE) 500 MG tablet Take 1 tablet (500 mg total) by mouth 2 (two) times daily with a meal. 60 tablet 2  . Multiple Vitamin (MULTI-VITAMINS) TABS Take by mouth.    . triamcinolone (KENALOG) 0.025 % ointment Apply topically 2 (two) times daily. 15 g 2   No current facility-administered medications for this visit.     Medical Decision Making:  Established Problem, Stable/Improving (1), Review of Medication Regimen & Side Effects (2) and Review of New Medication or Change in Dosage (2)  Treatment Plan Summary:Medication management and Plan patient will continue the Wellbutrin XL 150 mg in the morning. I discussed perhaps the next visit we may discuss changing Wellbutrin forms if she needs a lower dose secondary side effect of sweating. We decided we will restart her Lexapro for her to take  with the Wellbutrin as it was helpful to her with anxiety. She will continue the alprazolam 0.25 mg of bedtime as needed for anxiety. We'll follow up in 1 month she's been encouraged calling questions as prior to next point. Risk and benefits of Lexapro discussed patient able to consent.  Faith Rogue 10/29/2014, 12:06 PM

## 2014-10-29 NOTE — Telephone Encounter (Signed)
left message that rx was faxed and confirmed fax.

## 2014-10-29 NOTE — Telephone Encounter (Signed)
rx faxed confirmed fax.

## 2014-10-29 NOTE — Progress Notes (Signed)
Name: Brooke Liu   MRN: [id number]    DOB: 03/21/1950   Date:10/29/2014       Progress Note  Subjective  Chief Complaint  Chief Complaint  Patient presents with  . Acute Visit    Sinus Infection    Sinusitis This is a new problem. The current episode started 1 to 4 weeks ago. There has been no fever. The pain is moderate. Associated symptoms include congestion, headaches, sinus pressure, sneezing and a sore throat. Pertinent negatives include no chills, coughing, ear pain or shortness of breath. Past treatments include oral decongestants.      Past Medical History  Diagnosis Date  . Anxiety   . Depression   . Cancer   . Diabetes mellitus, type II     Past Surgical History  Procedure Laterality Date  . Abdominal hysterectomy    . Cholecystectomy    . Cesarean section    . Gastric bypass    . Hernia repair    . Small bowel repair      Family History  Problem Relation Age of Onset  . Depression Mother   . Diabetes Mother   . Heart disease Mother   . Heart disease Father   . Diabetes Sister   . Diabetes Brother   . Dementia Brother   . Heart disease Brother   . Diabetes Sister   . Fibromyalgia Sister   . Diabetes Brother   . Heart disease Brother   . Diabetes Brother   . Heart disease Brother   . Leukemia Brother   . Cancer Brother   . Liver disease Brother   . Alcohol abuse Brother   . Throat cancer Brother   . Heart disease Brother   . Diabetes Brother     Social History   Social History  . Marital Status: Widowed    Spouse Name: N/A  . Number of Children: N/A  . Years of Education: N/A   Occupational History  . Not on file.   Social History Main Topics  . Smoking status: Former Smoker    Types: Cigarettes    Quit date: 09/24/1977  . Smokeless tobacco: Never Used  . Alcohol Use: No  . Drug Use: No  . Sexual Activity: No   Other Topics Concern  . Not on file   Social History Narrative     Current outpatient prescriptions:   .  acetaminophen (TYLENOL) 325 MG tablet, Take by mouth., Disp: , Rfl:  .  buPROPion (WELLBUTRIN XL) 150 MG 24 hr tablet, Take 1 tablet (150 mg total) by mouth every morning., Disp: 30 tablet, Rfl: 3 .  Cholecalciferol (VITAMIN D-1000 MAX ST) 1000 UNITS tablet, Take by mouth., Disp: , Rfl:  .  desloratadine (CLARINEX) 5 MG tablet, Take 1 tablet (5 mg total) by mouth daily., Disp: 30 tablet, Rfl: 2 .  escitalopram (LEXAPRO) 10 MG tablet, Take 1 tablet (10 mg total) by mouth daily., Disp: 30 tablet, Rfl: 3 .  fluticasone (FLONASE) 50 MCG/ACT nasal spray, , Disp: , Rfl: 0 .  hydrOXYzine (VISTARIL) 25 MG capsule, , Disp: , Rfl: 0 .  IRON PO, Take 325 mg by mouth., Disp: , Rfl:  .  Loratadine 10 MG CAPS, Take by mouth., Disp: , Rfl:  .  metFORMIN (GLUCOPHAGE) 500 MG tablet, Take 1 tablet (500 mg total) by mouth 2 (two) times daily with a meal., Disp: 60 tablet, Rfl: 2 .  Multiple Vitamin (MULTI-VITAMINS) TABS, Take by mouth., Disp: , Rfl:  .  triamcinolone (KENALOG) 0.025 % ointment, Apply topically 2 (two) times daily., Disp: 15 g, Rfl: 2  Allergies  Allergen Reactions  . Iodinated Diagnostic Agents     MRI Dye     Review of Systems  Constitutional: Negative for chills.  HENT: Positive for congestion, sinus pressure, sneezing and sore throat. Negative for ear pain.   Respiratory: Negative for cough and shortness of breath.   Neurological: Positive for headaches.      Objective  Filed Vitals:   10/29/14 1607  BP: 120/77  Pulse: 114  Temp: 98.2 F (36.8 C)  TempSrc: Oral  Resp: 20  Height: 5\' 2"  (1.575 m)  Weight: 173 lb 3.2 oz (78.563 kg)  SpO2: 97%    Physical Exam  Constitutional: She is well-developed, well-nourished, and in no distress.  HENT:  Right Ear: Tympanic membrane and ear canal normal.  Left Ear: Tympanic membrane and ear canal normal.  Nose: Mucosal edema present. Right sinus exhibits maxillary sinus tenderness. Left sinus exhibits maxillary sinus  tenderness.  Mouth/Throat: No posterior oropharyngeal edema or posterior oropharyngeal erythema.  Cardiovascular: Normal rate and regular rhythm.   Pulmonary/Chest: Effort normal and breath sounds normal.  Skin: Rash noted. Rash is vesicular.  Dry, pruritic rash over the right hand palmar side.  Nursing note and vitals reviewed.   Assessment & Plan  1. Acute sinusitis with symptoms > 10 days Rx for Z-Pak provided for relief of acute sinusitis symptoms. - azithromycin (ZITHROMAX Z-PAK) 250 MG tablet; Take 1 tablet (250 mg total) by mouth daily. 2 tabs po x day 1, then 1 tab po q day x 4 days.  Dispense: 6 each; Refill: 0  2. Dermatitis, eczematoid Patient has used triamcinolone in the past which did not work. We will start her on mometasone ointment for relief of dermatitis. RTC if symptoms not improving within 2 weeks. - mometasone (ELOCON) 0.1 % ointment; Apply topically daily.  Dispense: 45 g; Refill: 0   Ahmad Vanwey Asad A. [facility] 10/29/2014 4:19 PM

## 2014-10-29 NOTE — Telephone Encounter (Signed)
pt seen you today, pt states that you decrease her lexapro to 10mg  and she is on wellbutrin but it not helping the anxiety. pt would like to know if you can let her go back on xanax until the lexapro starts working

## 2014-10-29 NOTE — Telephone Encounter (Signed)
Asian has previously had good results of alprazolam. She is requested another prescription for this medication. As we are trying to address her anxiety with Lexapro we will provide a prescription for alprazolam 0.25 mg twice a day as needed for anxiety. AW

## 2014-11-01 ENCOUNTER — Ambulatory Visit: Payer: BLUE CROSS/BLUE SHIELD

## 2014-11-18 ENCOUNTER — Ambulatory Visit: Payer: BLUE CROSS/BLUE SHIELD

## 2014-11-25 ENCOUNTER — Ambulatory Visit: Payer: BLUE CROSS/BLUE SHIELD | Admitting: Psychiatry

## 2014-11-26 ENCOUNTER — Telehealth: Payer: Self-pay | Admitting: Psychiatry

## 2014-11-26 ENCOUNTER — Ambulatory Visit: Payer: BLUE CROSS/BLUE SHIELD | Admitting: Psychiatry

## 2014-11-26 NOTE — Telephone Encounter (Signed)
Patient contacted the office stating she wanted to come off the Wellbutrin. She canceled her appointment today because she is not feeling well. I attempted to call her back but received a voicemail and left a message with my contact information

## 2014-12-02 ENCOUNTER — Ambulatory Visit (INDEPENDENT_AMBULATORY_CARE_PROVIDER_SITE_OTHER): Payer: BLUE CROSS/BLUE SHIELD | Admitting: Psychiatry

## 2014-12-02 ENCOUNTER — Encounter: Payer: Self-pay | Admitting: Psychiatry

## 2014-12-02 VITALS — BP 132/82 | HR 86 | Temp 97.9°F | Ht 62.0 in | Wt 173.6 lb

## 2014-12-02 DIAGNOSIS — F331 Major depressive disorder, recurrent, moderate: Secondary | ICD-10-CM

## 2014-12-02 DIAGNOSIS — F411 Generalized anxiety disorder: Secondary | ICD-10-CM | POA: Diagnosis not present

## 2014-12-02 NOTE — Progress Notes (Signed)
BH MD/PA/NP OP Progress Note  12/02/2014 2:08 PM Brooke Liu  MRN:  [id number]  Subjective:  Patient is following up with major depressive disorder, recurrent moderate and generalized anxiety disorder. Patient contacted the clinic stating that the Wellbutrin. She said that initially it did help her mood but she thinks it also may have contributed to anxiety and being more high strung. She states that the Lexapro is generally helped her and perhaps what was causing her to not be motivated and have drive is the fact that she had been sitting around her house for 3 months. She states that she would prefer to just be on the Lexapro. She has been on 10 mg a day and in the past is been on 20 mg. However per our discussion she could not clearly state that her mood was better on 20 mg and thus we decided to discontinue the 10 mg daily.  She has been instructed that she is on the lowest dose of the Wellbutrin XL and that she can just discontinue this. I discussed that should she have issues with discontinuing it  to call me. She says otherwise she is doing fairly well and feels most like her self on the Lexapro. She feels like overall her mood is good and she does interact some with family members. Chief Complaint:  want just try Lexapro Chief Complaint    Follow-up; Medication Refill; Medication Problem     Visit Diagnosis:  No diagnosis found.  Past Medical History:  Past Medical History  Diagnosis Date  . Anxiety   . Depression   . Cancer   . Diabetes mellitus, type II     Past Surgical History  Procedure Laterality Date  . Abdominal hysterectomy    . Cholecystectomy    . Cesarean section    . Gastric bypass    . Hernia repair    . Small bowel repair     Family History:  Family History  Problem Relation Age of Onset  . Depression Mother   . Diabetes Mother   . Heart disease Mother   . Heart disease Father   . Diabetes Sister   . Diabetes Brother   . Dementia Brother   .  Heart disease Brother   . Diabetes Sister   . Fibromyalgia Sister   . Diabetes Brother   . Heart disease Brother   . Diabetes Brother   . Heart disease Brother   . Leukemia Brother   . Cancer Brother   . Liver disease Brother   . Alcohol abuse Brother   . Throat cancer Brother   . Heart disease Brother   . Diabetes Brother    Social History:  Social History   Social History  . Marital Status: Widowed    Spouse Name: N/A  . Number of Children: N/A  . Years of Education: N/A   Social History Main Topics  . Smoking status: Former Smoker    Types: Cigarettes    Quit date: 09/24/1977  . Smokeless tobacco: Never Used  . Alcohol Use: No  . Drug Use: No  . Sexual Activity: No   Other Topics Concern  . None   Social History Narrative   Additional History:   Assessment:   Musculoskeletal: Strength & Muscle Tone: within normal limits Gait & Station: Slow and ambulating with one cane Patient leans: N/A  Psychiatric Specialty Exam: Anxiety Patient reports no insomnia, nervous/anxious behavior or suicidal ideas.      Review of Systems  Psychiatric/Behavioral: Negative for depression, suicidal ideas, hallucinations, memory loss and substance abuse. The patient is not nervous/anxious and does not have insomnia.     Blood pressure 132/82, pulse 86, temperature 97.9 F (36.6 C), temperature source Tympanic, height 5\' 2"  (1.575 m), weight 173 lb 9.6 oz (78.744 kg), SpO2 96 %.Body mass index is 31.74 kg/(m^2).  General Appearance: Well Groomed  Eye Contact:  Good  Speech:  Clear and Coherent and Normal Rate  Volume:  Normal  Mood:  Good  Affect:  Bright  Thought Process:  Linear and Logical  Orientation:  Full (Time, Place, and Person)  Thought Content:  Negative  Suicidal Thoughts:  No  Homicidal Thoughts:  No  Memory:  Immediate;   Good Recent;   Good Remote;   Good  Judgement:  Good  Insight:  Good  Psychomotor Activity:  Normal  Concentration:  Good   Recall:  Good  Fund of Knowledge: Good  Language: Good  Akathisia:  Negative  Handed:  Right unknown   AIMS (if indicated):  N/A  Assets:  Communication Skills Desire for Improvement Others:  Involvement with grandchildren  ADL's:  Intact  Cognition: WNL  Sleep:  Poor    Is the patient at risk to self?  No. Has the patient been a risk to self in the past 6 months?  No. Has the patient been a risk to self within the distant past?  No. Is the patient a risk to others?  No. Has the patient been a risk to others in the past 6 months?  No. Has the patient been a risk to others within the distant past?  No.  Current Medications: Current Outpatient Prescriptions  Medication Sig Dispense Refill  . acetaminophen (TYLENOL) 325 MG tablet Take by mouth.    Marland Kitchen azithromycin (ZITHROMAX Z-PAK) 250 MG tablet Take 1 tablet (250 mg total) by mouth daily. 2 tabs po x day 1, then 1 tab po q day x 4 days. 6 each 0  . Cholecalciferol (VITAMIN D-1000 MAX ST) 1000 UNITS tablet Take by mouth.    . desloratadine (CLARINEX) 5 MG tablet Take 1 tablet (5 mg total) by mouth daily. 30 tablet 2  . escitalopram (LEXAPRO) 10 MG tablet Take 1 tablet (10 mg total) by mouth daily. 30 tablet 3  . fluticasone (FLONASE) 50 MCG/ACT nasal spray   0  . IRON PO Take 325 mg by mouth.    . Loratadine 10 MG CAPS Take by mouth.    . metFORMIN (GLUCOPHAGE) 500 MG tablet Take 1 tablet (500 mg total) by mouth 2 (two) times daily with a meal. 60 tablet 2  . mometasone (ELOCON) 0.1 % ointment Apply topically daily. 45 g 0  . Multiple Vitamin (MULTI-VITAMINS) TABS Take by mouth.     No current facility-administered medications for this visit.    Medical Decision Making:  Established Problem, Stable/Improving (1), Review of Medication Regimen & Side Effects (2) and Review of New Medication or Change in Dosage (2)  Treatment Plan Summary:Medication management and Plan   Major depressive disorder, recurrent, moderate-continue  Lexapro 10 mg daily. She will discontinue the Wellbutrin XL as she reports it may have made her more anxious.  Generalized anxiety disorder-Lexapro as above. Patient case she is not been taking any alprazolam as is listed in her chart.  Patient will follow up in 1 month. At that time we'll discuss whether to employ anymore augmentation strategies such as adding Seroquel, Abilify or Rick salty.  Milus Banister  Williams 12/02/2014, 2:08 PM

## 2014-12-11 ENCOUNTER — Ambulatory Visit
Admission: EM | Admit: 2014-12-11 | Discharge: 2014-12-11 | Disposition: A | Discharge status: Home / Self Care | Payer: BLUE CROSS/BLUE SHIELD | Attending: Family Medicine | Admitting: Family Medicine

## 2014-12-11 ENCOUNTER — Encounter: Payer: Self-pay | Admitting: Emergency Medicine

## 2014-12-11 DIAGNOSIS — L0390 Cellulitis, unspecified: Secondary | ICD-10-CM | POA: Diagnosis not present

## 2014-12-11 DIAGNOSIS — L309 Dermatitis, unspecified: Secondary | ICD-10-CM | POA: Diagnosis not present

## 2014-12-11 MED ORDER — CEPHALEXIN 500 MG PO CAPS: 500.0000 mg | ORAL_CAPSULE | Freq: Three times a day (TID) | ORAL | Status: DC

## 2014-12-11 NOTE — ED Notes (Signed)
Wound on right arm for 2 weeks. Rash in palm of hand

## 2014-12-11 NOTE — ED Provider Notes (Signed)
CSN: [id number]     Arrival date & time 12/11/14  1835 History   First MD Initiated Contact with Patient 12/11/14 1851     Chief Complaint  Patient presents with  . Wound Infection  . Rash   (Consider location/radiation/quality/duration/timing/severity/associated sxs/prior Treatment) HPI Comments: 64 yo female with a 2 weeks h/o superficial wound on right forearm; states seems to be improving but still some redness, slightly tender and yesterday had some slight pus drainage.  Also has h/o eczematoid dermatitis which has been flaring up on her hands. Denies any fevers or chills.   The history is provided by the patient.    Past Medical History  Diagnosis Date  . Anxiety   . Depression   . Cancer ([place])   . Diabetes mellitus, type II [facility])    Past Surgical History  Procedure Laterality Date  . Abdominal hysterectomy    . Cholecystectomy    . Cesarean section    . Gastric bypass    . Hernia repair    . Small bowel repair     Family History  Problem Relation Age of Onset  . Depression Mother   . Diabetes Mother   . Heart disease Mother   . Heart disease Father   . Diabetes Sister   . Diabetes Brother   . Dementia Brother   . Heart disease Brother   . Diabetes Sister   . Fibromyalgia Sister   . Diabetes Brother   . Heart disease Brother   . Diabetes Brother   . Heart disease Brother   . Leukemia Brother   . Cancer Brother   . Liver disease Brother   . Alcohol abuse Brother   . Throat cancer Brother   . Heart disease Brother   . Diabetes Brother    Social History  Substance Use Topics  . Smoking status: Former Smoker    Types: Cigarettes    Quit date: 09/24/1977  . Smokeless tobacco: Never Used  . Alcohol Use: No   OB History    No data available     Review of Systems  Allergies  Iodinated diagnostic agents  Home Medications   Prior to Admission medications   Medication Sig Start Date End Date Taking? Authorizing Provider  acetaminophen (TYLENOL)  325 MG tablet Take by mouth.    Historical Provider, MD  azithromycin (ZITHROMAX Z-PAK) 250 MG tablet Take 1 tablet (250 mg total) by mouth daily. 2 tabs po x day 1, then 1 tab po q day x 4 days. 10/29/14   Roselee Nova, MD  cephALEXin (KEFLEX) 500 MG capsule Take 1 capsule (500 mg total) by mouth 3 (three) times daily. 12/11/14   Norval Gable, MD  Cholecalciferol (VITAMIN D-1000 MAX ST) 1000 UNITS tablet Take by mouth.    Historical Provider, MD  desloratadine (CLARINEX) 5 MG tablet Take 1 tablet (5 mg total) by mouth daily. 10/02/14   Roselee Nova, MD  escitalopram (LEXAPRO) 10 MG tablet Take 1 tablet (10 mg total) by mouth daily. 10/29/14   Marjie Skiff, MD  fluticasone Asencion Islam) 50 MCG/ACT nasal spray  09/25/14   Historical Provider, MD  IRON PO Take 325 mg by mouth.    Historical Provider, MD  Loratadine 10 MG CAPS Take by mouth.    Historical Provider, MD  metFORMIN (GLUCOPHAGE) 500 MG tablet Take 1 tablet (500 mg total) by mouth 2 (two) times daily with a meal. 10/02/14   Roselee Nova, MD  mometasone (ELOCON) 0.1 % ointment Apply topically daily. 10/29/14   Roselee Nova, MD  Multiple Vitamin (MULTI-VITAMINS) TABS Take by mouth.    Historical Provider, MD   Meds Ordered and Administered this Visit  Medications - No data to display  BP 152/89 mmHg  Pulse 74  Temp(Src) 98.5 F (36.9 C) (Tympanic)  Resp 16  Ht 5\' 2"  (1.575 m)  Wt 169 lb (76.658 kg)  BMI 30.90 kg/m2  SpO2 100% No data found.   Physical Exam  Constitutional: She appears well-developed and well-nourished. No distress.  Skin: Rash noted. She is not diaphoretic.  Scaly, erythematous rash on palms and digits; right forearm with healing erythematous superficial wound with mild tenderness to palpation but no drainage  Nursing note and vitals reviewed.   ED Course  Procedures (including critical care time)  Labs Review Labs Reviewed - No data to display  Imaging Review No results found.   Visual  Acuity Review  Right Eye Distance:   Left Eye Distance:   Bilateral Distance:    Right Eye Near:   Left Eye Near:    Bilateral Near:         MDM   1. Wound cellulitis   2. Dermatitis, eczematoid    Discharge Medication List as of 12/11/2014  7:16 PM    START taking these medications   Details  cephALEXin (KEFLEX) 500 MG capsule Take 1 capsule (500 mg total) by mouth 3 (three) times daily., Starting 12/11/2014, Until Discontinued, Normal      1.  diagnosis reviewed with patient/parent/guardian/family 2. rx as per orders above; reviewed possible side effects, interactions, risks and benefits  3. Recommend supportive treatment with wound care 4. Follow prn if symptoms worsen or don't improve    Norval Gable, MD 12/17/14 1259

## 2015-01-02 ENCOUNTER — Other Ambulatory Visit: Payer: Self-pay | Admitting: Family Medicine

## 2015-01-06 ENCOUNTER — Ambulatory Visit: Payer: Self-pay | Admitting: Family Medicine

## 2015-02-14 ENCOUNTER — Encounter: Payer: Self-pay | Admitting: Emergency Medicine

## 2015-02-14 ENCOUNTER — Emergency Department
Admission: EM | Admit: 2015-02-14 | Discharge: 2015-02-14 | Disposition: A | Discharge status: Home / Self Care | Payer: BLUE CROSS/BLUE SHIELD | Attending: Emergency Medicine | Admitting: Emergency Medicine

## 2015-02-14 ENCOUNTER — Emergency Department: Payer: BLUE CROSS/BLUE SHIELD

## 2015-02-14 DIAGNOSIS — S42301D Unspecified fracture of shaft of humerus, right arm, subsequent encounter for fracture with routine healing: Secondary | ICD-10-CM

## 2015-02-14 DIAGNOSIS — W541XXD Struck by dog, subsequent encounter: Secondary | ICD-10-CM | POA: Insufficient documentation

## 2015-02-14 DIAGNOSIS — S42291D Other displaced fracture of upper end of right humerus, subsequent encounter for fracture with routine healing: Secondary | ICD-10-CM | POA: Insufficient documentation

## 2015-02-14 DIAGNOSIS — S60221D Contusion of right hand, subsequent encounter: Secondary | ICD-10-CM | POA: Insufficient documentation

## 2015-02-14 DIAGNOSIS — Z79899 Other long term (current) drug therapy: Secondary | ICD-10-CM | POA: Insufficient documentation

## 2015-02-14 DIAGNOSIS — Z792 Long term (current) use of antibiotics: Secondary | ICD-10-CM | POA: Insufficient documentation

## 2015-02-14 DIAGNOSIS — I10 Essential (primary) hypertension: Secondary | ICD-10-CM | POA: Insufficient documentation

## 2015-02-14 DIAGNOSIS — R200 Anesthesia of skin: Secondary | ICD-10-CM | POA: Insufficient documentation

## 2015-02-14 DIAGNOSIS — Z87891 Personal history of nicotine dependence: Secondary | ICD-10-CM | POA: Insufficient documentation

## 2015-02-14 DIAGNOSIS — E119 Type 2 diabetes mellitus without complications: Secondary | ICD-10-CM | POA: Insufficient documentation

## 2015-02-14 DIAGNOSIS — Z7952 Long term (current) use of systemic steroids: Secondary | ICD-10-CM | POA: Insufficient documentation

## 2015-02-14 DIAGNOSIS — Z7984 Long term (current) use of oral hypoglycemic drugs: Secondary | ICD-10-CM | POA: Insufficient documentation

## 2015-02-14 MED ORDER — HYDROCODONE-ACETAMINOPHEN 5-325 MG PO TABS
1.0000 | ORAL_TABLET | Freq: Once | ORAL | Status: AC
  Administered 2015-02-14: 1 via ORAL
  Filled 2015-02-14: qty 1

## 2015-02-14 NOTE — ED Provider Notes (Signed)
[facility] Emergency Department Provider Note    ____________________________________________  Time seen: 2135  I have reviewed the triage vital signs and the nursing notes.   HISTORY  Chief Complaint Fall   History limited by: Not Limited   HPI Brooke Liu is a 64 y.o. female who presents to the emergency department today because of concerns for worsening right shoulder pain. The patient states that 4 days ago she was pulled to the ground by a dog and suffered a proximal humerus fracture. She states that she was able to manage the pain at home until today when she had another fall and landed again on her right shoulder. She states that she stumbled by her bed. She denies hitting her head or any loss of consciousness. She states that since that time the pain is been 15 out of 10. She also is complaining of numbness in all 4 extremities. Patient states that the Percocet that she was given at [facility] started making her itch after a couple of doses so she stopped taking it.     Past Medical History  Diagnosis Date  . Anxiety   . Depression   . Cancer ([place])   . Diabetes mellitus, type II [facility])     Patient Active Problem List   Diagnosis Date Noted  . Acute sinusitis with symptoms > 10 days 10/29/2014  . Multiple thyroid nodules 10/02/2014  . Dyslipidemia 10/02/2014  . Seasonal allergies 10/02/2014  . Anemia 09/25/2014  . Anxiety and depression 09/25/2014  . Carotid artery narrowing 09/25/2014  . Sex counseling 09/25/2014  . Skin lesion 09/25/2014  . Dermatitis, eczematoid 09/25/2014  . Screening for gout 09/25/2014  . Abscess, dental 09/25/2014  . Flu vaccine need 09/25/2014  . Benign hypertension 09/25/2014  . Gravida 2 para 2 09/25/2014  . Encounter for general adult medical examination without abnormal findings 09/25/2014  . Acute urinary tract infection 09/25/2014  . Thyroid nodule 09/25/2014  . Infection of urinary tract 09/25/2014   . Allergic rhinitis, seasonal 09/25/2014  . Diabetes mellitus, type 2 ([place]) 09/05/2014  . Multinodular goiter 01/14/2014  . Personal history of surgery to heart and great vessels, presenting hazards to health 10/22/2013  . Anxiety 10/15/2013  . Chest discomfort 10/15/2013  . BP (high blood pressure) 10/15/2013  . 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 Difficulty in walking 09/25/2013  . Loss of feeling or sensation 09/25/2013  . Cephalalgia 09/25/2013  . Cervical pain 09/25/2013  . Tingling 09/25/2013    Past Surgical History  Procedure Laterality Date  . Abdominal hysterectomy    . Cholecystectomy    . Cesarean section    . Gastric bypass    . Hernia repair    . Small bowel repair      Current Outpatient Rx  Name  Route  Sig  Dispense  Refill  . acetaminophen (TYLENOL) 325 MG tablet   Oral   Take by mouth.         Marland Kitchen azithromycin (ZITHROMAX Z-PAK) 250 MG tablet   Oral   Take 1 tablet (250 mg total) by mouth daily. 2 tabs po x day 1, then 1 tab po q day x 4 days.   6 each   0   . cephALEXin (KEFLEX) 500 MG capsule   Oral   Take 1 capsule (500 mg total) by mouth 3 (three) times daily.   21 capsule   0   . Cholecalciferol (VITAMIN D-1000 MAX ST) 1000 UNITS tablet   Oral  Take by mouth.         . desloratadine (CLARINEX) 5 MG tablet   Oral   Take 1 tablet (5 mg total) by mouth daily.   30 tablet   2   . escitalopram (LEXAPRO) 10 MG tablet   Oral   Take 1 tablet (10 mg total) by mouth daily.   30 tablet   3   . fluticasone (FLONASE) 50 MCG/ACT nasal spray            0   . IRON PO   Oral   Take 325 mg by mouth.         . Loratadine 10 MG CAPS   Oral   Take by mouth.         . metFORMIN (GLUCOPHAGE) 500 MG tablet      TAKE ONE TABLET BY MOUTH TWICE DAILY WITH MEALS   60 tablet   0   . mometasone (ELOCON) 0.1 % ointment   Topical   Apply topically daily.   45 g   0   . Multiple Vitamin (MULTI-VITAMINS) TABS   Oral   Take by mouth.            Allergies Iodinated diagnostic agents  Family History  Problem Relation Age of Onset  . Depression Mother   . Diabetes Mother   . Heart disease Mother   . Heart disease Father   . Diabetes Sister   . Diabetes Brother   . Dementia Brother   . Heart disease Brother   . Diabetes Sister   . Fibromyalgia Sister   . Diabetes Brother   . Heart disease Brother   . Diabetes Brother   . Heart disease Brother   . Leukemia Brother   . Cancer Brother   . Liver disease Brother   . Alcohol abuse Brother   . Throat cancer Brother   . Heart disease Brother   . Diabetes Brother     Social History Social History  Substance Use Topics  . Smoking status: Former Smoker    Types: Cigarettes    Quit date: 09/24/1977  . Smokeless tobacco: Never Used  . Alcohol Use: No    Review of Systems  Constitutional: Negative for fever. Cardiovascular: Negative for chest pain. Respiratory: Negative for shortness of breath. Gastrointestinal: Negative for abdominal pain, vomiting and diarrhea. Genitourinary: Negative for dysuria. Musculoskeletal: Negative for back pain. Positive for right shoulder pain Skin: Negative for rash. Neurological: Negative for headaches, focal weakness or numbness.   10-point ROS otherwise negative.  ____________________________________________   PHYSICAL EXAM:  VITAL SIGNS: ED Triage Vitals  Enc Vitals Group     BP 02/14/15 2110 162/100 mmHg     Pulse Rate 02/14/15 2110 77     Resp 02/14/15 2110 18     Temp 02/14/15 2110 97.9 F (36.6 C)     Temp Source 02/14/15 2110 Oral     SpO2 02/14/15 2110 100 %     Weight 02/14/15 2110 185 lb 11.2 oz (84.233 kg)     Height 02/14/15 2110 5\' 2"  (1.575 m)     Head Cir --      Peak Flow --      Pain Score 02/14/15 2111 8   Constitutional: Alert and oriented. Well appearing and in no distress. Eyes: Conjunctivae are normal. PERRL. Normal extraocular movements. ENT   Head: Normocephalic and  atraumatic.   Nose: No congestion/rhinnorhea.   Mouth/Throat: Mucous membranes are moist.   Neck: No  stridor. Hematological/Lymphatic/Immunilogical: No cervical lymphadenopathy. Cardiovascular: Normal rate, regular rhythm.  No murmurs, rubs, or gallops. Respiratory: Normal respiratory effort without tachypnea nor retractions. Breath sounds are clear and equal bilaterally. No wheezes/rales/rhonchi. Gastrointestinal: Soft and nontender. No distention. There is no CVA tenderness. Genitourinary: Deferred Musculoskeletal: Normal range of motion in all extremities. No joint effusions.  No lower extremity tenderness nor edema. Neurologic:  Normal speech and language. No gross focal neurologic deficits are appreciated.  Skin:  Skin is warm, dry and intact. Ecchymosis to the right humerus. Psychiatric: Mood and affect are normal. Speech and behavior are normal. Patient exhibits appropriate insight and judgment.  ____________________________________________    LABS (pertinent positives/negatives)  None  ____________________________________________   EKG  I, Nance Pear, attending physician, personally viewed and interpreted this EKG  EKG Time: 2113 Rate: 77 Rhythm: NSr Axis: normal Intervals: qtc 429 QRS: narrow ST changes: no st elevation Impression: normal ekg ____________________________________________    RADIOLOGY  Right humerus IMPRESSION: Mildly comminuted fracture involving the right humeral head and neck, with a minimally displaced greater tuberosity fragment.  ____________________________________________   PROCEDURES  Procedure(s) performed: None  Critical Care performed: No  ____________________________________________   INITIAL IMPRESSION / ASSESSMENT AND PLAN / ED COURSE  Pertinent labs & imaging results that were available during my care of the patient were reviewed by me and considered in my medical decision making (see chart for  details).  Patient presents to the emergency department today with concerns for right shoulder pain after she fell on a known right humerus fracture. X-rays here today show a mildly comminuted fracture. At this point patient neurovascularly intact. She does have follow-up already scheduled in 3 days at [facility]. Patient felt better after oral pain medications here. Will discharge home.  ____________________________________________   FINAL CLINICAL IMPRESSION(S) / ED DIAGNOSES  Final diagnoses:  Humerus fracture, right, with routine healing, subsequent encounter     Nance Pear, MD 02/15/15 1227

## 2015-02-14 NOTE — ED Notes (Signed)
Pt presents to er from home with a fall from standing up. Pt was recently seen by [facility] for a fall with fractured right humerus. Currently states right side hurts. Pt took tramadol and flexeril without relief. Alert and oriented on arrival.

## 2015-02-14 NOTE — Discharge Instructions (Signed)
Please seek medical attention for any high fevers, chest pain, shortness of breath, change in behavior, persistent vomiting, bloody stool or any other new or concerning symptoms.  Shoulder Fracture (Proximal Humerus or Glenoid) A shoulder fracture is a broken upper arm bone or a broken socket bone. The humerus is the upper arm bone and the glenoid is the shoulder socket. Proximal means the humerus is broken near the shoulder. Most of the time the bones of a broken shoulder are in an acceptable position. Usually, the injury can be treated with a shoulder immobilizer or sling and swath bandage. These devices support the arm and prevent any shoulder movement. If the bones are not in a good position, then surgery is sometimes needed. Shoulder fractures usually initially cause swelling, pain, and discoloration around the upper arm. They heal in 8 to 12 weeks with proper treatment. SYMPTOMS  At the time of injury:  Pain.  Tenderness.  Regular body contours are not normal. Later symptoms may include:  Swelling and bruising of the elbow and hand.  Swelling and bruising of the arm or chest. Other symptoms include:  Pain when lifting or turning the arm.  Paralysis below the fracture.  Numbness or coldness below the fracture. CAUSES   Indirect force from falling on an outstretched arm.  A blow to the shoulder. RISK INCREASES WITH:  Not being in shape.  Playing contact sports, such as football, soccer, hockey, or rugby.  Sports where falling on an outstretched arm occurs, such as basketball, skateboarding, or volleyball.  History of bone or joint disease.  History of shoulder injury. PREVENTION  Warm up before activity.  Stretch before activity.  Stay in shape with your:  Heart fitness.  Flexibility.  Shoulder Strength.  Falling with the proper technique. PROGNOSIS  In adults, healing time is about 7 weeks. For children, healing time is about 5 weeks. Surgery may be  needed. RELATED COMPLICATIONS  The bones do not heal together (nonunion).  The bones do not align properly when they heal (malunion).  Long-term problems with pain, stiffness, swelling, or loss of motion.  The injured arm heals shorter than the other.  Nerves are injured in the arm.  Arthritis in the shoulder.  Normal bone growth is interrupted in children.  Blood supply to the shoulder joint is diminished. TREATMENT If the bones are aligned, then initial treatment will be with ice and medicine to help with pain. The shoulder will be held in place with a sling (immobilization). The shoulder will be allowed to heal for up to 6 weeks. Injuries that may need surgery include:  Severe fractures.  Fractures that are not in appropriate alignment (displaced).  Non-displaced fractures (not common). Surgery helps the bones align correctly. The bones may be held in place with:  Sutures.  Wires.  Rods.  Plates.  Screws.  Pins. If you have had surgery or not, you will likely be assisted by a physical therapist or athletic trainer to get the best results with your injured shoulder. This will likely include exercises to strengthen and stretch the injured and surrounding areas. MEDICATION  If pain medicine is needed, nonsteroidal anti-inflammatory medicines (such as aspirin or ibuprofen) or other minor pain relievers (such as acetaminophen) are often advised.  Do not take pain medicine for 7 days before surgery.  Stronger pain relievers may be prescribed. Use only as directed and take only as much as you need. COLD THERAPY Cold treatment (icing) relieves pain and reduces inflammation. Cold treatment should be  applied for 10 to 15 minutes every 2 to 3 hours, and immediately after activity that aggravates your symptoms. Use ice packs or an ice massage. SEEK IMMEDIATE MEDICAL CARE IF:  You have severe shoulder pain unrelieved by rest and taking pain medicine.  You have pain,  numbness, tingling, or weakness in the hand or wrist.  You have shortness of breath, chest pain, severe weakness, or fainting.  You have severe pain with motion of the fingers or wrist.  Blue, gray, or dark color appears in the fingernails on injured extremity.   This information is not intended to replace advice given to you by your health care provider. Make sure you discuss any questions you have with your health care provider.   Document Released: 02/22/2005 Document Revised: 05/17/2011 Document Reviewed: 06/06/2008 Elsevier Interactive Patient Education [employer].

## 2015-05-09 ENCOUNTER — Ambulatory Visit
Admission: EM | Admit: 2015-05-09 | Discharge: 2015-05-09 | Disposition: A | Discharge status: Home / Self Care | Payer: BLUE CROSS/BLUE SHIELD | Attending: Family Medicine | Admitting: Family Medicine

## 2015-05-09 ENCOUNTER — Encounter: Payer: Self-pay | Admitting: Emergency Medicine

## 2015-05-09 DIAGNOSIS — B349 Viral infection, unspecified: Secondary | ICD-10-CM | POA: Diagnosis not present

## 2015-05-09 LAB — RAPID INFLUENZA A&B ANTIGENS: Influenza A (ARMC): NEGATIVE

## 2015-05-09 LAB — RAPID INFLUENZA A&B ANTIGENS (ARMC ONLY): INFLUENZA B (ARMC): NEGATIVE

## 2015-05-09 MED ORDER — HYDROCOD POLST-CPM POLST ER 10-8 MG/5ML PO SUER: 5.0000 mL | Freq: Two times a day (BID) | ORAL | Status: DC | PRN

## 2015-05-09 MED ORDER — OSELTAMIVIR PHOSPHATE 75 MG PO CAPS: 75.0000 mg | ORAL_CAPSULE | Freq: Two times a day (BID) | ORAL | Status: DC

## 2015-05-09 NOTE — ED Notes (Signed)
Pt presents with cough and chills. States two family members in the  Home have had the flu.

## 2015-05-09 NOTE — ED Provider Notes (Addendum)
CSN: [id number]     Arrival date & time 05/09/15  1358 History   First MD Initiated Contact with Patient 05/09/15 1504     Chief Complaint  Patient presents with  . Cough  . Generalized Body Aches   (Consider location/radiation/quality/duration/timing/severity/associated sxs/prior Treatment) Patient is a 65 y.o. female presenting with URI. The history is provided by the patient.  URI Presenting symptoms: congestion and cough   Severity:  Moderate Onset quality:  Sudden Duration:  1 day Timing:  Constant Progression:  Worsening Chronicity:  New Relieved by:  Nothing Ineffective treatments:  OTC medications Associated symptoms: arthralgias, headaches and myalgias   Risk factors: sick contacts (households members with flu)     Past Medical History  Diagnosis Date  . Anxiety   . Depression   . Cancer ([place])   . Diabetes mellitus, type II [facility])    Past Surgical History  Procedure Laterality Date  . Abdominal hysterectomy    . Cholecystectomy    . Cesarean section    . Gastric bypass    . Hernia repair    . Small bowel repair     Family History  Problem Relation Age of Onset  . Depression Mother   . Diabetes Mother   . Heart disease Mother   . Heart disease Father   . Diabetes Sister   . Diabetes Brother   . Dementia Brother   . Heart disease Brother   . Diabetes Sister   . Fibromyalgia Sister   . Diabetes Brother   . Heart disease Brother   . Diabetes Brother   . Heart disease Brother   . Leukemia Brother   . Cancer Brother   . Liver disease Brother   . Alcohol abuse Brother   . Throat cancer Brother   . Heart disease Brother   . Diabetes Brother    Social History  Substance Use Topics  . Smoking status: Former Smoker    Types: Cigarettes    Quit date: 09/24/1977  . Smokeless tobacco: Never Used  . Alcohol Use: No   OB History    No data available     Review of Systems  HENT: Positive for congestion.   Respiratory: Positive for cough.    Musculoskeletal: Positive for myalgias and arthralgias.  Neurological: Positive for headaches.    Allergies  Iodinated diagnostic agents and Percocet  Home Medications   Prior to Admission medications   Medication Sig Start Date End Date Taking? Authorizing Provider  acetaminophen (TYLENOL) 500 MG tablet Take 500 mg by mouth every 6 (six) hours as needed for mild pain, moderate pain or headache.    Historical Provider, MD  chlorpheniramine-HYDROcodone (TUSSIONEX PENNKINETIC ER) 10-8 MG/5ML SUER Take 5 mLs by mouth every 12 (twelve) hours as needed for cough. 05/09/15   Norval Gable, MD  cyclobenzaprine (FLEXERIL) 10 MG tablet Take 10 mg by mouth 3 (three) times daily as needed for muscle spasms.    Historical Provider, MD  escitalopram (LEXAPRO) 5 MG tablet Take 5 mg by mouth at bedtime.    Historical Provider, MD  fluticasone Asencion Islam) 50 MCG/ACT nasal spray  09/25/14   Historical Provider, MD  IRON PO Take 325 mg by mouth.    Historical Provider, MD  Loratadine 10 MG CAPS Take by mouth.    Historical Provider, MD  metFORMIN (GLUCOPHAGE) 500 MG tablet Take by mouth 2 (two) times daily with a meal.    Historical Provider, MD  Multiple Vitamin (MULTI-VITAMINS) TABS Take by mouth.  Historical Provider, MD  oseltamivir (TAMIFLU) 75 MG capsule Take 1 capsule (75 mg total) by mouth 2 (two) times daily. 05/09/15   Norval Gable, MD  traMADol (ULTRAM) 50 MG tablet Take 50 mg by mouth every 6 (six) hours as needed.    Historical Provider, MD  Vitamin D, Ergocalciferol, (DRISDOL) 50000 UNITS CAPS capsule Take 50,000 Units by mouth every 7 (seven) days.    Historical Provider, MD   Meds Ordered and Administered this Visit  Medications - No data to display  BP 131/76 mmHg  Pulse 76  Temp(Src) 98.5 F (36.9 C) (Oral)  Resp 20  Ht 5\' 2"  (1.575 m)  Wt 175 lb (79.379 kg)  BMI 32.00 kg/m2  SpO2 100% No data found.   Physical Exam  Constitutional: She appears well-developed and well-nourished.  No distress.  HENT:  Head: Normocephalic and atraumatic.  Right Ear: Tympanic membrane, external ear and ear canal normal.  Left Ear: Tympanic membrane, external ear and ear canal normal.  Nose: Mucosal edema and rhinorrhea present. No nose lacerations, sinus tenderness, nasal deformity, septal deviation or nasal septal hematoma. No epistaxis.  No foreign bodies.  Mouth/Throat: Uvula is midline, oropharynx is clear and moist and mucous membranes are normal. No oropharyngeal exudate.  Eyes: Conjunctivae and EOM are normal. Pupils are equal, round, and reactive to light. Right eye exhibits no discharge. Left eye exhibits no discharge. No scleral icterus.  Neck: Normal range of motion. Neck supple. No thyromegaly present.  Cardiovascular: Normal rate, regular rhythm and normal heart sounds.   Pulmonary/Chest: Effort normal and breath sounds normal. No respiratory distress. She has no wheezes. She has no rales.  Lymphadenopathy:    She has no cervical adenopathy.  Skin: No rash noted. She is not diaphoretic.  Nursing note and vitals reviewed.   ED Course  Procedures (including critical care time)  Labs Review Labs Reviewed  RAPID INFLUENZA A&B ANTIGENS ([place])    Imaging Review No results found.   Visual Acuity Review  Right Eye Distance:   Left Eye Distance:   Bilateral Distance:    Right Eye Near:   Left Eye Near:    Bilateral Near:         MDM   1. Viral syndrome    Discharge Medication List as of 05/09/2015  3:22 PM    START taking these medications   Details  chlorpheniramine-HYDROcodone (TUSSIONEX PENNKINETIC ER) 10-8 MG/5ML SUER Take 5 mLs by mouth every 12 (twelve) hours as needed for cough., Starting 05/09/2015, Until Discontinued, Normal    oseltamivir (TAMIFLU) 75 MG capsule Take 1 capsule (75 mg total) by mouth 2 (two) times daily., Starting 05/09/2015, Until Discontinued, Normal       1. diagnosis reviewed with patient 2. rx as per orders above;  reviewed possible side effects, interactions, risks and benefits  3. Recommend supportive treatment with otc analgesics prn, rest, increased fluids 4. Follow-up prn if symptoms worsen or don't improve    Norval Gable, MD 05/09/15 [place], MD 05/09/15 [phone]

## 2015-09-16 ENCOUNTER — Ambulatory Visit (INDEPENDENT_AMBULATORY_CARE_PROVIDER_SITE_OTHER): Payer: BLUE CROSS/BLUE SHIELD | Admitting: Family Medicine

## 2015-09-16 ENCOUNTER — Encounter: Payer: Self-pay | Admitting: Family Medicine

## 2015-09-16 VITALS — BP 132/70 | HR 75 | Temp 97.9°F | Resp 15 | Ht 62.0 in | Wt 178.4 lb

## 2015-09-16 DIAGNOSIS — E785 Hyperlipidemia, unspecified: Secondary | ICD-10-CM | POA: Diagnosis not present

## 2015-09-16 DIAGNOSIS — J302 Other seasonal allergic rhinitis: Secondary | ICD-10-CM

## 2015-09-16 DIAGNOSIS — E119 Type 2 diabetes mellitus without complications: Secondary | ICD-10-CM | POA: Diagnosis not present

## 2015-09-16 DIAGNOSIS — L309 Dermatitis, unspecified: Secondary | ICD-10-CM | POA: Diagnosis not present

## 2015-09-16 DIAGNOSIS — F418 Other specified anxiety disorders: Secondary | ICD-10-CM | POA: Diagnosis not present

## 2015-09-16 DIAGNOSIS — F419 Anxiety disorder, unspecified: Secondary | ICD-10-CM

## 2015-09-16 DIAGNOSIS — F32A Depression, unspecified: Secondary | ICD-10-CM

## 2015-09-16 DIAGNOSIS — F329 Major depressive disorder, single episode, unspecified: Secondary | ICD-10-CM

## 2015-09-16 LAB — COMPREHENSIVE METABOLIC PANEL
ALT: 11 U/L (ref 6–29)
AST: 16 U/L (ref 10–35)
Albumin: 4.1 g/dL (ref 3.6–5.1)
Alkaline Phosphatase: 96 U/L (ref 33–130)
BUN: 13 mg/dL (ref 7–25)
CHLORIDE: 104 mmol/L (ref 98–110)
CO2: 26 mmol/L (ref 20–31)
Calcium: 9 mg/dL (ref 8.6–10.4)
Creat: 0.66 mg/dL (ref 0.50–0.99)
GLUCOSE: 85 mg/dL (ref 65–99)
POTASSIUM: 5 mmol/L (ref 3.5–5.3)
SODIUM: 138 mmol/L (ref 135–146)
Total Bilirubin: 0.5 mg/dL (ref 0.2–1.2)
Total Protein: 7 g/dL (ref 6.1–8.1)

## 2015-09-16 LAB — LIPID PANEL
Cholesterol: 197 mg/dL (ref 125–200)
HDL: 55 mg/dL (ref >=46)
LDL CALC: 106 mg/dL (ref <130)
TRIGLYCERIDES: 178 mg/dL — AB (ref <150)
Total CHOL/HDL Ratio: 3.6 Ratio (ref <=5.0)
VLDL: 36 mg/dL — AB (ref <30)

## 2015-09-16 MED ORDER — FLUTICASONE PROPIONATE 50 MCG/ACT NA SUSP: 2.0000 | Freq: Every day | NASAL | Status: DC

## 2015-09-16 MED ORDER — ESCITALOPRAM OXALATE 10 MG PO TABS: 10.0000 mg | ORAL_TABLET | Freq: Every day | ORAL | Status: DC

## 2015-09-16 MED ORDER — METFORMIN HCL 1000 MG PO TABS: 500.0000 mg | ORAL_TABLET | Freq: Two times a day (BID) | ORAL | Status: DC

## 2015-09-16 MED ORDER — MOMETASONE FUROATE 0.1 % EX OINT: TOPICAL_OINTMENT | Freq: Every day | CUTANEOUS | Status: DC

## 2015-09-16 MED ORDER — DESLORATADINE 5 MG PO TABS: 5.0000 mg | ORAL_TABLET | Freq: Every day | ORAL | Status: DC

## 2015-09-16 NOTE — Progress Notes (Signed)
Name: Brooke Liu   MRN: [id number]    DOB: [birth year]   Date:09/16/2015       Progress Note  Subjective  Chief Complaint  Chief Complaint  Patient presents with  . Medication Refill    Diabetes She presents for her follow-up diabetic visit. She has type 2 diabetes mellitus. Her disease course has been improving. Hypoglycemia symptoms include nervousness/anxiousness. Pertinent negatives for hypoglycemia include no headaches. Associated symptoms include fatigue, polydipsia and polyuria. Current diabetic treatment includes oral agent (monotherapy). She is following a generally healthy diet. Her breakfast blood glucose range is generally 90-110 mg/dl.  Hyperlipidemia This is a chronic problem. The problem is uncontrolled. Recent lipid tests were reviewed and are high. Exacerbating diseases include diabetes. She is currently on no antihyperlipidemic treatment.  Depression        This is a chronic problem.  The onset quality is gradual.   The problem has been gradually improving since onset.  Associated symptoms include fatigue.  Associated symptoms include no helplessness, no hopelessness, no decreased interest, no appetite change, no headaches and not sad.  Past treatments include SSRIs - Selective serotonin reuptake inhibitors.  Compliance with treatment is good.   Past Medical History  Diagnosis Date  . Anxiety   . Depression   . Cancer ([place])   . Diabetes mellitus, type II [facility])     Past Surgical History  Procedure Laterality Date  . Abdominal hysterectomy    . Cholecystectomy    . Cesarean section    . Gastric bypass    . Hernia repair    . Small bowel repair      Family History  Problem Relation Age of Onset  . Depression Mother   . Diabetes Mother   . Heart disease Mother   . Heart disease Father   . Diabetes Sister   . Diabetes Brother   . Dementia Brother   . Heart disease Brother   . Diabetes Sister   . Fibromyalgia Sister   . Diabetes Brother   .  Heart disease Brother   . Diabetes Brother   . Heart disease Brother   . Leukemia Brother   . Cancer Brother   . Liver disease Brother   . Alcohol abuse Brother   . Throat cancer Brother   . Heart disease Brother   . Diabetes Brother     Social History   Social History  . Marital Status: Widowed    Spouse Name: N/A  . Number of Children: N/A  . Years of Education: N/A   Occupational History  . Not on file.   Social History Main Topics  . Smoking status: Former Smoker    Types: Cigarettes    Quit date: 09/24/1977  . Smokeless tobacco: Never Used  . Alcohol Use: No  . Drug Use: No  . Sexual Activity: No   Other Topics Concern  . Not on file   Social History Narrative     Current outpatient prescriptions:  .  acetaminophen (TYLENOL) 500 MG tablet, Take 500 mg by mouth every 6 (six) hours as needed for mild pain, moderate pain or headache., Disp: , Rfl:  .  escitalopram (LEXAPRO) 10 MG tablet, Take 10 mg by mouth daily., Disp: , Rfl:  .  fluticasone (FLONASE) 50 MCG/ACT nasal spray, , Disp: , Rfl: 0 .  IRON PO, Take 325 mg by mouth., Disp: , Rfl:  .  Loratadine 10 MG CAPS, Take by mouth., Disp: , Rfl:  .  metFORMIN (GLUCOPHAGE) 500 MG tablet, Take by mouth 2 (two) times daily with a meal., Disp: , Rfl:  .  Multiple Vitamin (MULTI-VITAMINS) TABS, Take by mouth., Disp: , Rfl:   Allergies  Allergen Reactions  . Oxycodone-Acetaminophen Hives    Very itchy underneath the skin.  Marland Kitchen Percocet [Oxycodone-Acetaminophen] Hives    Very itchy underneath the skin.  . Iodinated Diagnostic Agents Itching    MRI Dye    Review of Systems  Constitutional: Positive for fatigue. Negative for appetite change.  Neurological: Negative for headaches.  Endo/Heme/Allergies: Positive for polydipsia.  Psychiatric/Behavioral: Positive for depression. The patient is nervous/anxious.     Objective  Filed Vitals:   09/16/15 1026  BP: 132/70  Pulse: 75  Temp: 97.9 F (36.6 C)   TempSrc: Oral  Resp: 15  Height: 5\' 2"  (1.575 m)  Weight: 178 lb 6.4 oz (80.922 kg)  SpO2: 97%    Physical Exam  Constitutional: She is oriented to person, place, and time and well-developed, well-nourished, and in no distress.  HENT:  Head: Normocephalic and atraumatic.  Cardiovascular: Normal rate, regular rhythm and normal heart sounds.   No murmur heard. Pulmonary/Chest: Effort normal and breath sounds normal. She has no wheezes.  Abdominal: Soft. Bowel sounds are normal. There is no tenderness.  Neurological: She is alert and oriented to person, place, and time.  Psychiatric: Mood, memory, affect and judgment normal.  Nursing note and vitals reviewed.     Assessment & Plan  1. Type 2 diabetes mellitus without complication, without long-term current use of insulin (HCC) A1c at goal, continue on metformin 500 mg twice daily. - metFORMIN (GLUCOPHAGE) 1000 MG tablet; Take 0.5 tablets (500 mg total) by mouth 2 (two) times daily with a meal.  Dispense: 90 tablet; Refill: 0 - POCT HgB A1C - POCT Glucose (CBG)  2. Allergic rhinitis, seasonal  - desloratadine (CLARINEX) 5 MG tablet; Take 1 tablet (5 mg total) by mouth daily.  Dispense: 90 tablet; Refill: 3 - fluticasone (FLONASE) 50 MCG/ACT nasal spray; Place 2 sprays into both nostrils daily.  Dispense: 16 g; Refill: 2  3. Anxiety and depression Symptoms stable and in remission. - escitalopram (LEXAPRO) 10 MG tablet; Take 1 tablet (10 mg total) by mouth daily.  Dispense: 90 tablet; Refill: 1  4. Dyslipidemia Recheck FLP, start on statin therapy for primary prevention - Lipid Profile - Comprehensive Metabolic Panel (CMET)  5. Dermatitis, eczematoid Prescription refill for mometasone is provided - mometasone (ELOCON) 0.1 % ointment; Apply topically daily.  Dispense: 45 g; Refill: 2    Keerthana Vanrossum [facility] 09/16/2015 10:50 AM

## 2015-09-17 ENCOUNTER — Telehealth: Payer: Self-pay | Admitting: Emergency Medicine

## 2015-09-17 NOTE — Telephone Encounter (Signed)
Patient notified of results. Will discuss at next visit

## 2015-10-03 ENCOUNTER — Encounter: Payer: Self-pay | Admitting: Family Medicine

## 2015-10-03 ENCOUNTER — Ambulatory Visit: Payer: Self-pay | Admitting: Family Medicine

## 2015-10-03 ENCOUNTER — Ambulatory Visit (INDEPENDENT_AMBULATORY_CARE_PROVIDER_SITE_OTHER): Payer: BLUE CROSS/BLUE SHIELD | Admitting: Family Medicine

## 2015-10-03 VITALS — BP 120/70 | HR 80 | Temp 98.0°F | Resp 16 | Ht 62.0 in | Wt 177.3 lb

## 2015-10-03 DIAGNOSIS — R5383 Other fatigue: Secondary | ICD-10-CM

## 2015-10-03 DIAGNOSIS — Z9889 Other specified postprocedural states: Secondary | ICD-10-CM | POA: Diagnosis not present

## 2015-10-03 DIAGNOSIS — E119 Type 2 diabetes mellitus without complications: Secondary | ICD-10-CM | POA: Diagnosis not present

## 2015-10-03 DIAGNOSIS — J019 Acute sinusitis, unspecified: Secondary | ICD-10-CM

## 2015-10-03 DIAGNOSIS — Z9884 Bariatric surgery status: Secondary | ICD-10-CM | POA: Insufficient documentation

## 2015-10-03 LAB — CBC WITH DIFFERENTIAL/PLATELET
BASOS PCT: 1 %
Basophils Absolute: 60 cells/uL (ref 0–200)
Eosinophils Absolute: 180 cells/uL (ref 15–500)
Eosinophils Relative: 3 %
HEMATOCRIT: 36.1 % (ref 35.0–45.0)
Hemoglobin: 11.1 g/dL — ABNORMAL LOW (ref 11.7–15.5)
LYMPHS ABS: 1620 {cells}/uL (ref 850–3900)
LYMPHS PCT: 27 %
MCH: 22.7 pg — ABNORMAL LOW (ref 27.0–33.0)
MCHC: 30.7 g/dL — ABNORMAL LOW (ref 32.0–36.0)
MCV: 73.7 fL — ABNORMAL LOW (ref 80.0–100.0)
MONO ABS: 720 {cells}/uL (ref 200–950)
MPV: 8.9 fL (ref 7.5–12.5)
Monocytes Relative: 12 %
Neutro Abs: 3420 cells/uL (ref 1500–7800)
Neutrophils Relative %: 57 %
Platelets: 317 10*3/uL (ref 140–400)
RBC: 4.9 MIL/uL (ref 3.80–5.10)
RDW: 17.3 % — AB (ref 11.0–15.0)
WBC: 6 10*3/uL (ref 3.8–10.8)

## 2015-10-03 LAB — GLUCOSE, POCT (MANUAL RESULT ENTRY): POC Glucose: 102 mg/dl — AB (ref 70–99)

## 2015-10-03 LAB — POCT GLYCOSYLATED HEMOGLOBIN (HGB A1C): Hemoglobin A1C: 5.7

## 2015-10-03 LAB — IRON: Iron: 30 ug/dL — ABNORMAL LOW (ref 45–160)

## 2015-10-03 LAB — TSH: TSH: 0.41 m[IU]/L

## 2015-10-03 MED ORDER — AMOXICILLIN-POT CLAVULANATE 875-125 MG PO TABS: 1.0000 | ORAL_TABLET | Freq: Two times a day (BID) | ORAL | 0 refills | Status: DC

## 2015-10-03 NOTE — Progress Notes (Signed)
Name: Brooke Liu   MRN: [id number]    DOB: [birth year]   Date:10/03/2015       Progress Note  Subjective  Chief Complaint  Chief Complaint  Patient presents with  . Follow-up    lab results due to Gastric Bypass surgery  . Diabetes    Sinusitis  This is a new problem. The current episode started 1 to 4 weeks ago (3-4 weeks.). There has been no fever. Associated symptoms include congestion, sinus pressure and sneezing. Pertinent negatives include no chills. Treatments tried: Floanse.   Pt. Presents today for evaluation of feeling 'tired all the time'. She feels fatigued, can sleep for 10 -12 hours straight, she tells me " I could sleep all day and all night [facility] not want to get up and go to work but 'once I get up, I am good to go'.  Pt. Has history of gastric bypass surgery 15 years ago, she gets 'sick' when she eats, complains of a lot of heartburn, food backing up in her throat etc.   Past Medical History:  Diagnosis Date  . Anxiety   . Cancer ([place])   . Depression   . Diabetes mellitus, type II [facility])     Past Surgical History:  Procedure Laterality Date  . ABDOMINAL HYSTERECTOMY    . CESAREAN SECTION    . CHOLECYSTECTOMY    . GASTRIC BYPASS    . HERNIA REPAIR    . SMALL BOWEL REPAIR      Family History  Problem Relation Age of Onset  . Depression Mother   . Diabetes Mother   . Heart disease Mother   . Heart disease Father   . Diabetes Sister   . Diabetes Brother   . Dementia Brother   . Heart disease Brother   . Diabetes Sister   . Fibromyalgia Sister   . Diabetes Brother   . Heart disease Brother   . Diabetes Brother   . Heart disease Brother   . Leukemia Brother   . Cancer Brother   . Liver disease Brother   . Alcohol abuse Brother   . Throat cancer Brother   . Heart disease Brother   . Diabetes Brother     Social History   Social History  . Marital status: Widowed    Spouse name: N/A  . Number of children: N/A  .  Years of education: N/A   Occupational History  . Not on file.   Social History Main Topics  . Smoking status: Former Smoker    Types: Cigarettes    Quit date: 09/24/1977  . Smokeless tobacco: Never Used  . Alcohol use No  . Drug use: No  . Sexual activity: No   Other Topics Concern  . Not on file   Social History Narrative  . No narrative on file     Current Outpatient Prescriptions:  .  acetaminophen (TYLENOL) 500 MG tablet, Take 500 mg by mouth every 6 (six) hours as needed for mild pain, moderate pain or headache., Disp: , Rfl:  .  desloratadine (CLARINEX) 5 MG tablet, Take 1 tablet (5 mg total) by mouth daily., Disp: 90 tablet, Rfl: 3 .  escitalopram (LEXAPRO) 10 MG tablet, Take 1 tablet (10 mg total) by mouth daily., Disp: 90 tablet, Rfl: 1 .  fluticasone (FLONASE) 50 MCG/ACT nasal spray, Place 2 sprays into both nostrils daily., Disp: 16 g, Rfl: 2 .  IRON PO, Take 325 mg by mouth., Disp: ,  Rfl:  .  metFORMIN (GLUCOPHAGE) 1000 MG tablet, Take 0.5 tablets (500 mg total) by mouth 2 (two) times daily with a meal., Disp: 90 tablet, Rfl: 0 .  mometasone (ELOCON) 0.1 % ointment, Apply topically daily., Disp: 45 g, Rfl: 2 .  Multiple Vitamin (MULTI-VITAMINS) TABS, Take by mouth., Disp: , Rfl:   Allergies  Allergen Reactions  . Oxycodone-Acetaminophen Hives    Very itchy underneath the skin.  Marland Kitchen Percocet [Oxycodone-Acetaminophen] Hives    Very itchy underneath the skin.  . Iodinated Diagnostic Agents Itching    MRI Dye     Review of Systems  Constitutional: Positive for malaise/fatigue. Negative for chills and fever.  HENT: Positive for congestion, sinus pressure and sneezing.   Cardiovascular: Negative for chest pain.  Gastrointestinal: Positive for heartburn. Negative for abdominal pain, nausea and vomiting.  Neurological: Positive for dizziness.    Objective  Vitals:   10/03/15 1105  BP: 120/70  Pulse: 80  Resp: 16  Temp: 98 F (36.7 C)  TempSrc: Oral   SpO2: 99%  Weight: 177 lb 4.8 oz (80.4 kg)  Height: 5\' 2"  (1.575 m)    Physical Exam  Constitutional: She is oriented to person, place, and time and well-developed, well-nourished, and in no distress.  HENT:  Head: Normocephalic and atraumatic.  Right Ear: Tympanic membrane and ear canal normal.  Left Ear: Tympanic membrane and ear canal normal.  Nose: Right sinus exhibits no maxillary sinus tenderness and no frontal sinus tenderness. Left sinus exhibits no maxillary sinus tenderness and no frontal sinus tenderness.  Nasal turbinate hypertrophy, mucosal inflammation.  Cardiovascular: Normal rate and regular rhythm.   No murmur heard. Pulmonary/Chest: Effort normal and breath sounds normal. She has no wheezes.  Abdominal: Soft. Bowel sounds are normal.  Musculoskeletal: She exhibits no edema.  Neurological: She is alert and oriented to person, place, and time.  Psychiatric: Mood, memory, affect and judgment normal.  Nursing note and vitals reviewed.     Assessment & Plan  1. Type 2 diabetes mellitus without complication, without long-term current use of insulin (HCC) A1c at goal at 5.7%, well-controlled diabetes. - POCT HgB A1C - POCT Glucose (CBG)  2. Other fatigue Clear etiology, obtained laboratory evaluation. Fatigue could be also related to depression. - CBC with Differential - TSH  3. History of gastric bypass Obtain pertinent lab work, patient had gastric bypass over 15 years ago and wants to have her labs drawn - Vitamin D (25 hydroxy) - B12 - Ferritin - Iron  4. Acute sinusitis, recurrence not specified, unspecified location  - amoxicillin-clavulanate (AUGMENTIN) 875-125 MG tablet; Take 1 tablet by mouth 2 (two) times daily.  Dispense: 20 tablet; Refill: 0    Man Effertz [facility] 10/03/2015 11:11 AM

## 2015-10-04 LAB — FERRITIN: Ferritin: 7 ng/mL — ABNORMAL LOW (ref 20–288)

## 2015-10-04 LAB — VITAMIN B12: VITAMIN B 12: 221 pg/mL (ref 200–1100)

## 2015-10-04 LAB — VITAMIN D 25 HYDROXY (VIT D DEFICIENCY, FRACTURES): VIT D 25 HYDROXY: 10 ng/mL — AB (ref 30–100)

## 2015-10-20 ENCOUNTER — Telehealth: Payer: Self-pay

## 2015-10-20 MED ORDER — VITAMIN D (ERGOCALCIFEROL) 1.25 MG (50000 UNIT) PO CAPS: 50000.0000 [IU] | ORAL_CAPSULE | ORAL | 0 refills | Status: DC

## 2015-10-20 NOTE — Telephone Encounter (Signed)
Prescription for Vitamin D3 50,000 units has been sent to [facility] per Dr. Manuella Ghazi

## 2015-11-13 ENCOUNTER — Encounter (INDEPENDENT_AMBULATORY_CARE_PROVIDER_SITE_OTHER): Payer: Self-pay

## 2015-11-13 ENCOUNTER — Encounter: Payer: Self-pay | Admitting: Family Medicine

## 2015-11-13 ENCOUNTER — Ambulatory Visit (INDEPENDENT_AMBULATORY_CARE_PROVIDER_SITE_OTHER): Payer: BLUE CROSS/BLUE SHIELD | Admitting: Family Medicine

## 2015-11-13 VITALS — BP 118/78 | HR 76 | Temp 98.1°F | Resp 16 | Ht 62.0 in | Wt 180.1 lb

## 2015-11-13 DIAGNOSIS — D509 Iron deficiency anemia, unspecified: Secondary | ICD-10-CM | POA: Diagnosis not present

## 2015-11-13 DIAGNOSIS — R12 Heartburn: Secondary | ICD-10-CM | POA: Diagnosis not present

## 2015-11-13 LAB — RETICULOCYTES
ABS RETIC: 47000 {cells}/uL (ref 20000–80000)
RBC.: 4.7 MIL/uL (ref 3.80–5.10)
RETIC CT PCT: 1 %

## 2015-11-13 LAB — CBC WITH DIFFERENTIAL/PLATELET
BASOS ABS: 65 {cells}/uL (ref 0–200)
BASOS PCT: 1 %
EOS ABS: 195 {cells}/uL (ref 15–500)
Eosinophils Relative: 3 %
HEMATOCRIT: 33.8 % — AB (ref 35.0–45.0)
HEMOGLOBIN: 10.8 g/dL — AB (ref 11.7–15.5)
LYMPHS ABS: 1885 {cells}/uL (ref 850–3900)
Lymphocytes Relative: 29 %
MCH: 23 pg — AB (ref 27.0–33.0)
MCHC: 32 g/dL (ref 32.0–36.0)
MCV: 71.9 fL — AB (ref 80.0–100.0)
MONO ABS: 585 {cells}/uL (ref 200–950)
MONOS PCT: 9 %
MPV: 9.2 fL (ref 7.5–12.5)
NEUTROS ABS: 3770 {cells}/uL (ref 1500–7800)
Neutrophils Relative %: 58 %
PLATELETS: 335 10*3/uL (ref 140–400)
RBC: 4.7 MIL/uL (ref 3.80–5.10)
RDW: 16 % — ABNORMAL HIGH (ref 11.0–15.0)
WBC: 6.5 10*3/uL (ref 3.8–10.8)

## 2015-11-13 LAB — IRON,TIBC AND FERRITIN PANEL
%SAT: 6 % — AB (ref 11–50)
Ferritin: 6 ng/mL — ABNORMAL LOW (ref 20–288)
Iron: 20 ug/dL — ABNORMAL LOW (ref 45–160)
TIBC: 357 ug/dL (ref 250–450)

## 2015-11-13 NOTE — Progress Notes (Signed)
Name: Brooke Liu   MRN: [id number]    DOB: 30-Sep-1950   Date:11/13/2015       Progress Note  Subjective  Chief Complaint  Chief Complaint  Patient presents with  . Results    abnormal results    HPI  P. Presents for work up of anemia, Hemoglobin dropped to 11.1 from 13.1 last year, she feels tired, no episodes of bleeding (blood in urine or stool, black colored stools, vaginal bleeding, hemoptysis etc). Has been taking iron tablets but feel like they are not working.  She is also concerned about her 'stomach' feels intermittent sharp pain, nauseated and heartburn.  She had abdominal hernia repair in [facility] 6 years ago and saw Dr. Boykin Reaper (Gastric Bypass Surgeon) in the ER at [place] 2 years ago and feels like the hernia site also appears painful at times.    Past Medical History:  Diagnosis Date  . Anxiety   . Cancer ([place])   . Depression   . Diabetes mellitus, type II [facility])     Past Surgical History:  Procedure Laterality Date  . ABDOMINAL HYSTERECTOMY    . CESAREAN SECTION    . CHOLECYSTECTOMY    . GASTRIC BYPASS    . HERNIA REPAIR    . SMALL BOWEL REPAIR      Family History  Problem Relation Age of Onset  . Depression Mother   . Diabetes Mother   . Heart disease Mother   . Heart disease Father   . Diabetes Sister   . Diabetes Brother   . Dementia Brother   . Heart disease Brother   . Diabetes Sister   . Fibromyalgia Sister   . Diabetes Brother   . Heart disease Brother   . Diabetes Brother   . Heart disease Brother   . Leukemia Brother   . Cancer Brother   . Liver disease Brother   . Alcohol abuse Brother   . Throat cancer Brother   . Heart disease Brother   . Diabetes Brother     Social History   Social History  . Marital status: Widowed    Spouse name: N/A  . Number of children: N/A  . Years of education: N/A   Occupational History  . Not on file.   Social History Main Topics  . Smoking status: Former Smoker    Types:  Cigarettes    Quit date: 09/24/1977  . Smokeless tobacco: Never Used  . Alcohol use No  . Drug use: No  . Sexual activity: No   Other Topics Concern  . Not on file   Social History Narrative  . No narrative on file     Current Outpatient Prescriptions:  .  acetaminophen (TYLENOL) 500 MG tablet, Take 500 mg by mouth every 6 (six) hours as needed for mild pain, moderate pain or headache., Disp: , Rfl:  .  amoxicillin-clavulanate (AUGMENTIN) 875-125 MG tablet, Take 1 tablet by mouth 2 (two) times daily., Disp: 20 tablet, Rfl: 0 .  desloratadine (CLARINEX) 5 MG tablet, Take 1 tablet (5 mg total) by mouth daily., Disp: 90 tablet, Rfl: 3 .  escitalopram (LEXAPRO) 10 MG tablet, Take 1 tablet (10 mg total) by mouth daily., Disp: 90 tablet, Rfl: 1 .  fluticasone (FLONASE) 50 MCG/ACT nasal spray, Place 2 sprays into both nostrils daily., Disp: 16 g, Rfl: 2 .  IRON PO, Take 325 mg by mouth., Disp: , Rfl:  .  metFORMIN (GLUCOPHAGE) 1000 MG tablet, Take 0.5 tablets (  500 mg total) by mouth 2 (two) times daily with a meal., Disp: 90 tablet, Rfl: 0 .  mometasone (ELOCON) 0.1 % ointment, Apply topically daily., Disp: 45 g, Rfl: 2 .  Multiple Vitamin (MULTI-VITAMINS) TABS, Take by mouth., Disp: , Rfl:  .  Vitamin D, Ergocalciferol, (DRISDOL) 50000 units CAPS capsule, Take 1 capsule (50,000 Units total) by mouth once a week. For 3 months, Disp: 24 capsule, Rfl: 0  Allergies  Allergen Reactions  . Oxycodone-Acetaminophen Hives    Very itchy underneath the skin.  Marland Kitchen Percocet [Oxycodone-Acetaminophen] Hives    Very itchy underneath the skin.  . Iodinated Diagnostic Agents Itching    MRI Dye     Review of Systems  Constitutional: Positive for malaise/fatigue. Negative for chills and fever.  Respiratory: Negative for cough and shortness of breath.   Cardiovascular: Negative for chest pain.  Gastrointestinal: Positive for heartburn.    Objective  Vitals:   11/13/15 0859  BP: 118/78  Pulse: 76   Resp: 16  Temp: 98.1 F (36.7 C)  SpO2: 96%  Weight: 180 lb 2 oz (81.7 kg)  Height: 5\' 2"  (1.575 m)    Physical Exam  Constitutional: She is well-developed, well-nourished, and in no distress.  Cardiovascular: Normal rate, regular rhythm, S1 normal, S2 normal and normal heart sounds.   No murmur heard. Pulmonary/Chest: Effort normal and breath sounds normal. She has no rhonchi.  Abdominal: Soft. Bowel sounds are normal. There is no tenderness.  Musculoskeletal:       Right ankle: She exhibits no swelling.       Left ankle: She exhibits no swelling.  Psychiatric: Mood, memory, affect and judgment normal.  Nursing note and vitals reviewed.    Assessment & Plan  1. Anemia, iron deficiency Obtain TIBC and reticulocyte count, referral to Hematology for IV iron since PO iron is not appropriately absorbed in setting of gastric bypass.  - CBC with Differential - Iron, TIBC and Ferritin Panel - Retic - Ambulatory referral to Hematology  2. Heartburn With nausea, has been told that she has a hiatal hernia, referral to GI for Endoscopy. - Ambulatory referral to Gastroenterology  Dossie Der [facility] 11/13/2015 9:09 AM

## 2015-11-17 ENCOUNTER — Other Ambulatory Visit: Payer: Self-pay

## 2015-11-17 ENCOUNTER — Telehealth: Payer: Self-pay | Admitting: Family Medicine

## 2015-11-17 ENCOUNTER — Encounter: Payer: Self-pay | Admitting: *Deleted

## 2015-11-17 ENCOUNTER — Telehealth: Payer: Self-pay

## 2015-11-17 NOTE — Telephone Encounter (Signed)
Patient stated that both her Hemoglobin and iron are very low and she wanted to asked Dr. Manuella Ghazi a question in regards to that.

## 2015-11-17 NOTE — Telephone Encounter (Signed)
Gastroenterology Pre-Procedure Review  Request Date: 11/21/2015 Requesting Physician: Dr. Manuella Ghazi  PATIENT REVIEW QUESTIONS: The patient responded to the following health history questions as indicated:    1. Are you having any GI issues? yes (Diarrhea & abdominal pain ) 2. Do you have a personal history of Polyps? yes (benign) 3. Do you have a family history of Colon Cancer or Polyps? no 4. Diabetes Mellitus? yes (Type 2) 5. Joint replacements in the past 12 months?no 6. Major health problems in the past 3 months?no 7. Any artificial heart valves, MVP, or defibrillator?no    MEDICATIONS & ALLERGIES:    Patient reports the following regarding taking any anticoagulation/antiplatelet therapy:   Plavix, Coumadin, Eliquis, Xarelto, Lovenox, Pradaxa, Brilinta, or Effient? no Aspirin? no  Patient confirms/reports the following medications:  Current Outpatient Prescriptions  Medication Sig Dispense Refill  . acetaminophen (TYLENOL) 500 MG tablet Take 500 mg by mouth every 6 (six) hours as needed for mild pain, moderate pain or headache.    . desloratadine (CLARINEX) 5 MG tablet Take 1 tablet (5 mg total) by mouth daily. 90 tablet 3  . escitalopram (LEXAPRO) 10 MG tablet Take 1 tablet (10 mg total) by mouth daily. 90 tablet 1  . fluticasone (FLONASE) 50 MCG/ACT nasal spray Place 2 sprays into both nostrils daily. 16 g 2  . metFORMIN (GLUCOPHAGE) 1000 MG tablet Take 0.5 tablets (500 mg total) by mouth 2 (two) times daily with a meal. 90 tablet 0  . mometasone (ELOCON) 0.1 % ointment Apply topically daily. 45 g 2  . Multiple Vitamin (MULTI-VITAMINS) TABS Take by mouth.    . Vitamin D, Ergocalciferol, (DRISDOL) 50000 units CAPS capsule Take 1 capsule (50,000 Units total) by mouth once a week. For 3 months 24 capsule 0   No current facility-administered medications for this visit.     Patient confirms/reports the following allergies:  Allergies  Allergen Reactions  . Oxycodone-Acetaminophen  Hives    Very itchy underneath the skin.  Marland Kitchen Percocet [Oxycodone-Acetaminophen] Hives    Very itchy underneath the skin.  . Iodinated Diagnostic Agents Itching    MRI Dye    No orders of the defined types were placed in this encounter.   AUTHORIZATION INFORMATION Primary Insurance: 1D#: Group #:  Secondary Insurance: 1D#: Group #:  SCHEDULE INFORMATION: Date: 11/21/2015 Time: Location: [facility]

## 2015-11-17 NOTE — Telephone Encounter (Signed)
Iron deficiency anemia D50.9 Colonoscopy and EGD [employer] (prior auth is not required)

## 2015-11-18 NOTE — Telephone Encounter (Signed)
Returned call and left a voice message for patient 

## 2015-11-20 NOTE — Discharge Instructions (Signed)

## 2015-11-21 ENCOUNTER — Ambulatory Visit
Admission: RE | Admit: 2015-11-21 | Discharge: 2015-11-21 | Disposition: A | Discharge status: Home / Self Care | Payer: BLUE CROSS/BLUE SHIELD | Source: Ambulatory Visit | Attending: Gastroenterology | Admitting: Gastroenterology

## 2015-11-21 ENCOUNTER — Encounter
Admission: RE | Disposition: A | Discharge status: Home / Self Care | Payer: Self-pay | Source: Ambulatory Visit | Attending: Gastroenterology

## 2015-11-21 ENCOUNTER — Ambulatory Visit: Payer: BLUE CROSS/BLUE SHIELD | Admitting: Student in an Organized Health Care Education/Training Program

## 2015-11-21 DIAGNOSIS — Z9884 Bariatric surgery status: Secondary | ICD-10-CM | POA: Insufficient documentation

## 2015-11-21 DIAGNOSIS — F419 Anxiety disorder, unspecified: Secondary | ICD-10-CM | POA: Insufficient documentation

## 2015-11-21 DIAGNOSIS — F329 Major depressive disorder, single episode, unspecified: Secondary | ICD-10-CM | POA: Diagnosis not present

## 2015-11-21 DIAGNOSIS — Z8542 Personal history of malignant neoplasm of other parts of uterus: Secondary | ICD-10-CM | POA: Diagnosis not present

## 2015-11-21 DIAGNOSIS — G4730 Sleep apnea, unspecified: Secondary | ICD-10-CM | POA: Insufficient documentation

## 2015-11-21 DIAGNOSIS — Z87891 Personal history of nicotine dependence: Secondary | ICD-10-CM | POA: Insufficient documentation

## 2015-11-21 DIAGNOSIS — K641 Second degree hemorrhoids: Secondary | ICD-10-CM | POA: Insufficient documentation

## 2015-11-21 DIAGNOSIS — Z7984 Long term (current) use of oral hypoglycemic drugs: Secondary | ICD-10-CM | POA: Insufficient documentation

## 2015-11-21 DIAGNOSIS — Z833 Family history of diabetes mellitus: Secondary | ICD-10-CM | POA: Diagnosis not present

## 2015-11-21 DIAGNOSIS — Z91041 Radiographic dye allergy status: Secondary | ICD-10-CM | POA: Insufficient documentation

## 2015-11-21 DIAGNOSIS — Z8249 Family history of ischemic heart disease and other diseases of the circulatory system: Secondary | ICD-10-CM | POA: Insufficient documentation

## 2015-11-21 DIAGNOSIS — D509 Iron deficiency anemia, unspecified: Secondary | ICD-10-CM | POA: Insufficient documentation

## 2015-11-21 DIAGNOSIS — E119 Type 2 diabetes mellitus without complications: Secondary | ICD-10-CM | POA: Diagnosis not present

## 2015-11-21 HISTORY — DX: Anemia, unspecified: D64.9

## 2015-11-21 HISTORY — PX: COLONOSCOPY WITH PROPOFOL: SHX5780

## 2015-11-21 HISTORY — DX: Headache: R51

## 2015-11-21 HISTORY — DX: Dizziness and giddiness: R42

## 2015-11-21 HISTORY — DX: Nontoxic multinodular goiter: E04.2

## 2015-11-21 HISTORY — DX: Unspecified hemorrhoids: K64.9

## 2015-11-21 HISTORY — PX: ESOPHAGOGASTRODUODENOSCOPY (EGD) WITH PROPOFOL: SHX5813

## 2015-11-21 HISTORY — DX: Sleep apnea, unspecified: G47.30

## 2015-11-21 HISTORY — DX: Headache, unspecified: R51.9

## 2015-11-21 HISTORY — DX: Motion sickness, initial encounter: T75.3XXA

## 2015-11-21 LAB — GLUCOSE, CAPILLARY
GLUCOSE-CAPILLARY: 110 mg/dL — AB (ref 65–99)
Glucose-Capillary: 98 mg/dL (ref 65–99)

## 2015-11-21 SURGERY — COLONOSCOPY WITH PROPOFOL
Anesthesia: Monitor Anesthesia Care

## 2015-11-21 MED ORDER — LACTATED RINGERS IV SOLN
INTRAVENOUS | Status: DC
  Administered 2015-11-21: 07:00:00 via INTRAVENOUS

## 2015-11-21 MED ORDER — PROPOFOL 10 MG/ML IV BOLUS
INTRAVENOUS | Status: DC | PRN
  Administered 2015-11-21 (×5): 50 mg via INTRAVENOUS
  Administered 2015-11-21: 20 mg via INTRAVENOUS
  Administered 2015-11-21 (×4): 50 mg via INTRAVENOUS

## 2015-11-21 MED ORDER — STERILE WATER FOR IRRIGATION IR SOLN
Status: DC | PRN
  Administered 2015-11-21: 09:00:00

## 2015-11-21 MED ORDER — LIDOCAINE HCL (CARDIAC) 20 MG/ML IV SOLN
INTRAVENOUS | Status: DC | PRN
  Administered 2015-11-21: 40 mg via INTRAVENOUS

## 2015-11-21 MED ORDER — GLYCOPYRROLATE 0.2 MG/ML IJ SOLN
INTRAMUSCULAR | Status: DC | PRN
  Administered 2015-11-21: 0.2 mg via INTRAVENOUS

## 2015-11-21 SURGICAL SUPPLY — 35 items

## 2015-11-21 NOTE — Transfer of Care (Signed)
Immediate Anesthesia Transfer of Care Note  Patient: Brooke Liu  Procedure(s) Performed: Procedure(s) with comments: COLONOSCOPY WITH PROPOFOL (N/A) ESOPHAGOGASTRODUODENOSCOPY (EGD) WITH PROPOFOL (N/A) - Diabetic - oral meds  Patient Location: PACU  Anesthesia Type: MAC  Level of Consciousness: awake, alert  and patient cooperative  Airway and Oxygen Therapy: Patient Spontanous Breathing and Patient connected to supplemental oxygen  Post-op Assessment: Post-op Vital signs reviewed, Patient's Cardiovascular Status Stable, Respiratory Function Stable, Patent Airway and No signs of Nausea or vomiting  Post-op Vital Signs: Reviewed and stable  Complications: No apparent anesthesia complications

## 2015-11-21 NOTE — Anesthesia Preprocedure Evaluation (Addendum)
Anesthesia Evaluation  Patient identified by MRN, date of birth, ID band Patient awake    Reviewed: Allergy & Precautions, H&P , NPO status , Patient's Chart, lab work & pertinent test results, reviewed documented beta blocker date and time   Airway Mallampati: III  TM Distance: >3 FB Neck ROM: full    Dental no notable dental hx.    Pulmonary sleep apnea and Continuous Positive Airway Pressure Ventilation , former smoker,    Pulmonary exam normal breath sounds clear to auscultation       Cardiovascular Exercise Tolerance: Good hypertension, On Medications + Peripheral Vascular Disease   Rhythm:regular Rate:Normal     Neuro/Psych  Headaches, PSYCHIATRIC DISORDERS    GI/Hepatic negative GI ROS, Neg liver ROS,   Endo/Other  negative endocrine ROSdiabetes  Renal/GU negative Renal ROS  negative genitourinary   Musculoskeletal   Abdominal   Peds  Hematology  (+) anemia ,   Anesthesia Other Findings   Reproductive/Obstetrics negative OB ROS                            Anesthesia Physical Anesthesia Plan  ASA: II  Anesthesia Plan: MAC   Post-op Pain Management:    Induction:   Airway Management Planned:   Additional Equipment:   Intra-op Plan:   Post-operative Plan:   Informed Consent: I have reviewed the patients History and Physical, chart, labs and discussed the procedure including the risks, benefits and alternatives for the proposed anesthesia with the patient or authorized representative who has indicated his/her understanding and acceptance.     Plan Discussed with: CRNA  Anesthesia Plan Comments:         Anesthesia Quick Evaluation

## 2015-11-21 NOTE — H&P (Signed)
Lucilla Lame, MD [facility] [address], [place], [place] [address] Phone: [phone] Fax : [phone]  Primary Care Physician:  Keith Rake, MD Primary Gastroenterologist:  Dr. Allen Norris  Pre-Procedure History & Physical: HPI:  Brooke Liu is a 65 y.o. female is here for an endoscopy and colonoscopy.   Past Medical History:  Diagnosis Date  . Anemia   . Anxiety   . Cancer ([place])    uterine ca  . Depression   . Diabetes mellitus, type II ([place])   . Headache    stress/sinus  . Hemorrhoids   . Motion sickness   . Multiple thyroid nodules   . Sleep apnea    past hx.  resolved with wt loss  . Vertigo    sinus related    Past Surgical History:  Procedure Laterality Date  . ABDOMINAL HYSTERECTOMY    . BACK SURGERY  2010   lumbar  . CESAREAN SECTION    . CHOLECYSTECTOMY    . GASTRIC BYPASS  2000  . HERNIA REPAIR    . SMALL BOWEL REPAIR      Prior to Admission medications   Medication Sig Start Date End Date Taking? Authorizing Provider  acetaminophen (TYLENOL) 500 MG tablet Take 500 mg by mouth every 6 (six) hours as needed for mild pain, moderate pain or headache.   Yes Historical Provider, MD  desloratadine (CLARINEX) 5 MG tablet Take 1 tablet (5 mg total) by mouth daily. 09/16/15  Yes Roselee Nova, MD  escitalopram (LEXAPRO) 10 MG tablet Take 1 tablet (10 mg total) by mouth daily. 09/16/15  Yes Roselee Nova, MD  fluticasone (FLONASE) 50 MCG/ACT nasal spray Place 2 sprays into both nostrils daily. 09/16/15  Yes Roselee Nova, MD  metFORMIN (GLUCOPHAGE) 1000 MG tablet Take 0.5 tablets (500 mg total) by mouth 2 (two) times daily with a meal. 09/16/15  Yes Roselee Nova, MD  mometasone (ELOCON) 0.1 % ointment Apply topically daily. 09/16/15  Yes Roselee Nova, MD  Multiple Vitamin (MULTI-VITAMINS) TABS Take by mouth.   Yes Historical Provider, MD  Vitamin D, Ergocalciferol, (DRISDOL) 50000 units CAPS capsule Take 1 capsule (50,000 Units total) by mouth  once a week. For 3 months 10/20/15  Yes Roselee Nova, MD    Allergies as of 11/17/2015 - Review Complete 11/17/2015  Allergen Reaction Noted  . Oxycodone-acetaminophen Hives 09/16/2015  . Percocet [oxycodone-acetaminophen] Hives 02/14/2015  . Iodinated diagnostic agents Itching 09/25/2014    Family History  Problem Relation Age of Onset  . Depression Mother   . Diabetes Mother   . Heart disease Mother   . Heart disease Father   . Diabetes Sister   . Diabetes Brother   . Dementia Brother   . Heart disease Brother   . Diabetes Sister   . Fibromyalgia Sister   . Diabetes Brother   . Heart disease Brother   . Diabetes Brother   . Heart disease Brother   . Leukemia Brother   . Cancer Brother   . Liver disease Brother   . Alcohol abuse Brother   . Throat cancer Brother   . Heart disease Brother   . Diabetes Brother     Social History   Social History  . Marital status: Widowed    Spouse name: N/A  . Number of children: N/A  . Years of education: N/A   Occupational History  . Not on file.   Social History Main  Topics  . Smoking status: Former Smoker    Types: Cigarettes    Quit date: 09/24/1977  . Smokeless tobacco: Never Used  . Alcohol use No  . Drug use: No  . Sexual activity: No   Other Topics Concern  . Not on file   Social History Narrative  . No narrative on file    Review of Systems: See HPI, otherwise negative ROS  Physical Exam: BP 111/62   Pulse 65   Temp 98.4 F (36.9 C) (Temporal)   Resp 16   Ht 5\' 2"  (1.575 m)   Wt 174 lb (78.9 kg)   SpO2 98%   BMI 31.83 kg/m  General:   Alert,  pleasant and cooperative in NAD Head:  Normocephalic and atraumatic. Neck:  Supple; no masses or thyromegaly. Lungs:  Clear throughout to auscultation.    Heart:  Regular rate and rhythm. Abdomen:  Soft, nontender and nondistended. Normal bowel sounds, without guarding, and without rebound.   Neurologic:  Alert and  oriented x4;  grossly normal  neurologically.  Impression/Plan: Brooke Liu is here for an endoscopy and colonoscopy to be performed for IDA  Risks, benefits, limitations, and alternatives regarding  endoscopy and colonoscopy have been reviewed with the patient.  Questions have been answered.  All parties agreeable.   Lucilla Lame, MD  11/21/2015, 7:53 AM

## 2015-11-21 NOTE — Op Note (Signed)
[facility] Gastroenterology Patient Name: Brooke Liu [facility] Procedure Date: 11/21/2015 8:25 AM MRN: [id number] Account #: [id number] Date of Birth: 1950-07-17 Admit Type: Outpatient Age: 65 Room: [facility] OR ROOM 01 Gender: Female Note Status: Finalized Procedure:            Upper GI endoscopy Indications:          Iron deficiency anemia Providers:            Lucilla Lame MD, MD Referring MD:         Otila Back. Manuella Ghazi (Referring MD) Medicines:            Propofol per Anesthesia Complications:        No immediate complications. Procedure:            Pre-Anesthesia Assessment:                       - Prior to the procedure, a History and Physical was                        performed, and patient medications and allergies were                        reviewed. The patient's tolerance of previous                        anesthesia was also reviewed. The risks and benefits of                        the procedure and the sedation options and risks were                        discussed with the patient. All questions were                        answered, and informed consent was obtained. Prior                        Anticoagulants: The patient has taken no previous                        anticoagulant or antiplatelet agents. ASA Grade                        Assessment: II - A patient with mild systemic disease.                        After reviewing the risks and benefits, the patient was                        deemed in satisfactory condition to undergo the                        procedure.                       After obtaining informed consent, the endoscope was                        passed under direct vision. Throughout the procedure,  the patient's blood pressure, pulse, and oxygen                        saturations were monitored continuously. The Olympus                        GIF H180J colonscope [id number]) was introduced     through the mouth, and advanced to the jejunum. The                        upper GI endoscopy was accomplished without difficulty.                        The patient tolerated the procedure well. Findings:      The examined esophagus was normal.      Evidence of a gastric bypass was found. A gastric pouch with a small       size was found. The gastrojejunal anastomosis was characterized by       healthy appearing mucosa. This was traversed.      The examined jejunum was normal. Impression:           - Normal esophagus.                       - Gastric bypass with a small-sized pouch.                        Gastrojejunal anastomosis characterized by healthy                        appearing mucosa.                       - Normal examined jejunum.                       - No specimens collected. Recommendation:       - Discharge patient to home.                       - Resume previous diet.                       - Continue present medications. Procedure Code(s):    --- Professional ---                       [phone], Esophagogastroduodenoscopy, flexible, transoral;                        diagnostic, including collection of specimen(s) by                        brushing or washing, when performed (separate procedure) Diagnosis Code(s):    --- Professional ---                       D50.9, Iron deficiency anemia, unspecified                       Z98.84, Bariatric surgery status CPT copyright 2016 American Medical Association. All rights reserved. The codes documented in this report are preliminary and upon coder review may  be revised to meet current compliance requirements. Brooke Liu  Laurent Cargile MD, MD 11/21/2015 8:36:18 AM This report has been signed electronically. Number of Addenda: 0 Note Initiated On: 11/21/2015 8:25 AM Total Procedure Duration: 0 hours 2 minutes 35 seconds       [facility]

## 2015-11-21 NOTE — Op Note (Signed)
[facility] Gastroenterology Patient Name: [facility] Procedure Date: 11/21/2015 8:25 AM MRN: [id number] Account #: [id number] Date of Birth: 1950/04/18 Admit Type: Outpatient Age: 65 Room: [facility] OR ROOM 01 Gender: Female Note Status: Finalized Procedure:            Colonoscopy Indications:          Iron deficiency anemia Providers:            Lucilla Lame MD, MD Referring MD:         Otila Back. Manuella Ghazi (Referring MD) Medicines:            Propofol per Anesthesia Complications:        No immediate complications. Procedure:            Pre-Anesthesia Assessment:                       - Prior to the procedure, a History and Physical was                        performed, and patient medications and allergies were                        reviewed. The patient's tolerance of previous                        anesthesia was also reviewed. The risks and benefits of                        the procedure and the sedation options and risks were                        discussed with the patient. All questions were                        answered, and informed consent was obtained. Prior                        Anticoagulants: The patient has taken no previous                        anticoagulant or antiplatelet agents. ASA Grade                        Assessment: II - A patient with mild systemic disease.                        After reviewing the risks and benefits, the patient was                        deemed in satisfactory condition to undergo the                        procedure.                       After obtaining informed consent, the colonoscope was                        passed under direct vision. Throughout the procedure,  the patient's blood pressure, pulse, and oxygen                        saturations were monitored continuously. The Olympus CF                        H180AL colonoscope (S#: [id number]) was introduced through          the anus and advanced to the the cecum, identified by                        appendiceal orifice and ileocecal valve. The                        colonoscopy was performed without difficulty. The                        patient tolerated the procedure well. Findings:      The perianal and digital rectal examinations were normal.      Non-bleeding internal hemorrhoids were found during retroflexion. The       hemorrhoids were Grade II (internal hemorrhoids that prolapse but reduce       spontaneously). Impression:           - Non-bleeding internal hemorrhoids.                       - No specimens collected. Recommendation:       - Discharge patient to home.                       - Resume previous diet.                       - Continue present medications. Procedure Code(s):    --- Professional ---                       [phone], Colonoscopy, flexible; diagnostic, including                        collection of specimen(s) by brushing or washing, when                        performed (separate procedure) Diagnosis Code(s):    --- Professional ---                       D50.9, Iron deficiency anemia, unspecified CPT copyright 2016 American Medical Association. All rights reserved. The codes documented in this report are preliminary and upon coder review may  be revised to meet current compliance requirements. Lucilla Lame MD, MD 11/21/2015 8:53:31 AM This report has been signed electronically. Number of Addenda: 0 Note Initiated On: 11/21/2015 8:25 AM Scope Withdrawal Time: 0 hours 6 minutes 14 seconds  Total Procedure Duration: 0 hours 13 minutes 45 seconds       [facility]

## 2015-11-21 NOTE — Anesthesia Postprocedure Evaluation (Signed)
Anesthesia Post Note  Patient: Brooke Liu Nicely  Procedure(s) Performed: Procedure(s) (LRB): COLONOSCOPY WITH PROPOFOL (N/A) ESOPHAGOGASTRODUODENOSCOPY (EGD) WITH PROPOFOL (N/A)  Patient location during evaluation: PACU Anesthesia Type: MAC Level of consciousness: awake and alert Pain management: pain level controlled Vital Signs Assessment: post-procedure vital signs reviewed and stable Respiratory status: spontaneous breathing, nonlabored ventilation and respiratory function stable Cardiovascular status: stable and blood pressure returned to baseline Anesthetic complications: no    Isaiyah Feldhaus D Kanna Dafoe

## 2015-11-24 ENCOUNTER — Telehealth: Payer: Self-pay | Admitting: Family Medicine

## 2015-11-24 ENCOUNTER — Encounter: Payer: Self-pay | Admitting: Gastroenterology

## 2015-11-27 NOTE — Telephone Encounter (Signed)
Returned call and discussed patient's lab results. She is anemic with hemoglobin of 10.8, hematocrit of 33.8%, MCV of 71.9, ferritin is below normal at 6 ng per mL. This picture is likely consistent with iron deficiency anemia. However, her reticulocyte count is normal and TIBC was normal which should be appropriately elevated. Patient has been referred to hematology and she has an upcoming appointment. I have recommended her to consult hematologist regarding the abnormal values.

## 2015-12-02 ENCOUNTER — Encounter: Payer: Self-pay | Admitting: Internal Medicine

## 2015-12-02 ENCOUNTER — Inpatient Hospital Stay: Payer: BLUE CROSS/BLUE SHIELD | Attending: Internal Medicine | Admitting: Internal Medicine

## 2015-12-02 VITALS — BP 129/74 | HR 78 | Temp 97.7°F | Resp 18 | Ht 62.0 in | Wt 179.1 lb

## 2015-12-02 DIAGNOSIS — F329 Major depressive disorder, single episode, unspecified: Secondary | ICD-10-CM | POA: Insufficient documentation

## 2015-12-02 DIAGNOSIS — R079 Chest pain, unspecified: Secondary | ICD-10-CM | POA: Diagnosis not present

## 2015-12-02 DIAGNOSIS — E119 Type 2 diabetes mellitus without complications: Secondary | ICD-10-CM | POA: Diagnosis not present

## 2015-12-02 DIAGNOSIS — D509 Iron deficiency anemia, unspecified: Secondary | ICD-10-CM | POA: Diagnosis present

## 2015-12-02 DIAGNOSIS — F419 Anxiety disorder, unspecified: Secondary | ICD-10-CM | POA: Insufficient documentation

## 2015-12-02 DIAGNOSIS — Z7984 Long term (current) use of oral hypoglycemic drugs: Secondary | ICD-10-CM

## 2015-12-02 DIAGNOSIS — Z9884 Bariatric surgery status: Secondary | ICD-10-CM | POA: Diagnosis not present

## 2015-12-02 DIAGNOSIS — Z79899 Other long term (current) drug therapy: Secondary | ICD-10-CM | POA: Diagnosis not present

## 2015-12-02 DIAGNOSIS — R5383 Other fatigue: Secondary | ICD-10-CM | POA: Diagnosis not present

## 2015-12-02 DIAGNOSIS — Z809 Family history of malignant neoplasm, unspecified: Secondary | ICD-10-CM

## 2015-12-02 DIAGNOSIS — R42 Dizziness and giddiness: Secondary | ICD-10-CM | POA: Diagnosis not present

## 2015-12-02 DIAGNOSIS — Z8542 Personal history of malignant neoplasm of other parts of uterus: Secondary | ICD-10-CM | POA: Diagnosis not present

## 2015-12-02 DIAGNOSIS — Z87891 Personal history of nicotine dependence: Secondary | ICD-10-CM | POA: Diagnosis not present

## 2015-12-02 NOTE — Assessment & Plan Note (Signed)
Sec to gastric bypass- patient is symptomatic. She does not tolerate by mouth iron. Recommend IV Venofer weekly 4. Discussed the potential infusion reactions.  # recommend follow up labs in 3 months/Iv venofer infusion.  # Thank you Dr.Shah for allowing me to participate in the care of your pleasant patient. Please do not hesitate to contact me with questions or concerns in the interim.

## 2015-12-02 NOTE — Progress Notes (Signed)
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     HISTORY OF PRESENTING ILLNESS:  Brooke Liu 65 y.o.  female the previous history of gastric bypass- has been referred to [place] for further evaluation and recommendations for iron deficiency anemia.  Patient admits to significant fatigue. Denies any blood in stools black stools. Denies any blood in urine. Complains of difficulty in concentration; irritability. No weight loss. No nausea no vomiting.  ROS: A complete 10 point review of system is done which is negative except mentioned above in history of present illness  MEDICAL HISTORY:  Past Medical History:  Diagnosis Date  . Anemia   . Anxiety   . Cancer ([place])    uterine ca  . Depression   . Diabetes mellitus, type II ([place])   . Headache    stress/sinus  . Hemorrhoids   . Motion sickness   . Multiple thyroid nodules   . Sleep apnea    past hx.  resolved with wt loss  . Vertigo    sinus related    SURGICAL HISTORY: Past Surgical History:  Procedure Laterality Date  . ABDOMINAL HYSTERECTOMY    . BACK SURGERY  2010   lumbar  . CESAREAN SECTION    . CHOLECYSTECTOMY    . COLONOSCOPY WITH PROPOFOL N/A 11/21/2015   Procedure: COLONOSCOPY WITH PROPOFOL;  Surgeon: Lucilla Lame, MD;  Location: [facility];  Service: Endoscopy;  Laterality: N/A;  . ESOPHAGOGASTRODUODENOSCOPY (EGD) WITH PROPOFOL N/A 11/21/2015   Procedure: ESOPHAGOGASTRODUODENOSCOPY (EGD) WITH PROPOFOL;  Surgeon: Lucilla Lame, MD;  Location: [facility];  Service: Endoscopy;  Laterality: N/A;  Diabetic - oral meds  . GASTRIC BYPASS  2000  . HERNIA REPAIR    . SMALL BOWEL REPAIR       SOCIAL HISTORY: Works as a Surveyor, minerals. No smoking or alcohol. Social History   Social History  . Marital status: Widowed    Spouse name: N/A  . Number of children: N/A  . Years of education: N/A   Occupational History  . Not on file.   Social History Main Topics  . Smoking status: Former Smoker    Years: 15.00    Types: Cigarettes    Quit date: 09/24/1977  . Smokeless tobacco: Never Used  . Alcohol use No  . Drug use: No  . Sexual activity: No   Other Topics Concern  . Not on file   Social History Narrative  . No narrative on file    FAMILY HISTORY: Family History  Problem Relation Age of Onset  . Depression Mother   . Diabetes Mother   . Heart disease Mother   . Heart disease Father   . Diabetes Sister   . Diabetes Brother   . Dementia Brother   . Heart disease Brother   . Diabetes Sister   . Fibromyalgia Sister   . Diabetes Brother   . Heart disease Brother   . Diabetes Brother   . Heart disease Brother   . Leukemia Brother   . Cancer Brother   . Liver disease Brother   . Alcohol abuse Brother   . Throat cancer Brother   . Heart  disease Brother   . Diabetes Brother     ALLERGIES:  is allergic to oxycodone-acetaminophen; percocet [oxycodone-acetaminophen]; and iodinated diagnostic agents.  MEDICATIONS:  Current Outpatient Prescriptions  Medication Sig Dispense Refill  . acetaminophen (TYLENOL) 500 MG tablet Take 500 mg by mouth every 6 (six) hours as needed for mild pain, moderate pain or headache.    . desloratadine (CLARINEX) 5 MG tablet Take 1 tablet (5 mg total) by mouth daily. 90 tablet 3  . escitalopram (LEXAPRO) 10 MG tablet Take 1 tablet (10 mg total) by mouth daily. 90 tablet 1  . fluticasone (FLONASE) 50 MCG/ACT nasal spray Place 2 sprays into both nostrils daily. 16 g 2  . metFORMIN (GLUCOPHAGE) 1000 MG tablet Take 0.5 tablets (500 mg total) by mouth 2 (two) times daily with a meal. 90 tablet 0  . mometasone (ELOCON) 0.1 % ointment Apply  topically daily. 45 g 2  . Multiple Vitamin (MULTI-VITAMINS) TABS Take by mouth.    . Vitamin D, Ergocalciferol, (DRISDOL) 50000 units CAPS capsule Take 1 capsule (50,000 Units total) by mouth once a week. For 3 months 24 capsule 0   No current facility-administered medications for this visit.       Marland Kitchen  PHYSICAL EXAMINATION: ECOG PERFORMANCE STATUS: 1 - Symptomatic but completely ambulatory  Vitals:   12/02/15 1446  BP: 129/74  Pulse: 78  Resp: 18  Temp: 97.7 F (36.5 C)   Filed Weights   12/02/15 1446  Weight: 179 lb 2 oz (81.3 kg)    GENERAL: Well-nourished well-developed; Alert, no distress and comfortable.   alone EYES: no pallor or icterus OROPHARYNX: no thrush or ulceration; good dentition  NECK: supple, no masses felt LYMPH:  no palpable lymphadenopathy in the cervical, axillary or inguinal regions LUNGS: clear to auscultation and  No wheeze or crackles HEART/CVS: regular rate & rhythm and no murmurs; No lower extremity edema ABDOMEN: abdomen soft, non-tender and normal bowel sounds Musculoskeletal:no cyanosis of digits and no clubbing  PSYCH: alert & oriented x 3 with fluent speech NEURO: no focal motor/sensory deficits SKIN:  no rashes or significant lesions  LABORATORY DATA:  I have reviewed the data as listed Lab Results  Component Value Date   WBC 6.5 11/13/2015   HGB 10.8 (L) 11/13/2015   HCT 33.8 (L) 11/13/2015   MCV 71.9 (L) 11/13/2015   PLT 335 11/13/2015    Recent Labs  09/16/15 1224  NA 138  K 5.0  CL 104  CO2 26  GLUCOSE 85  BUN 13  CREATININE 0.66  CALCIUM 9.0  PROT 7.0  ALBUMIN 4.1  AST 16  ALT 11  ALKPHOS 96  BILITOT 0.5    RADIOGRAPHIC STUDIES: I have personally reviewed the radiological images as listed and agreed with the findings in the report. No results found.  ASSESSMENT & PLAN:   Iron deficiency anemia, unspecified Sec to gastric bypass- patient is symptomatic. She does not tolerate by mouth iron. Recommend IV  Venofer weekly 4. Discussed the potential infusion reactions.  # recommend follow up labs in 3 months/Iv venofer infusion.  # Thank you Dr.Shah for allowing me to participate in the care of your pleasant patient. Please do not hesitate to contact me with questions or concerns in the interim.  All questions were answered. The patient knows to call the clinic with any problems, questions or concerns.     Cammie Sickle, MD 12/02/2015 3:44 PM

## 2015-12-02 NOTE — Progress Notes (Signed)
History uterine cancer, lung nodule and thyroid nodule being followed by [facility].  Reports SOB on exertion.

## 2015-12-05 ENCOUNTER — Inpatient Hospital Stay: Payer: BLUE CROSS/BLUE SHIELD

## 2015-12-05 VITALS — BP 124/77 | HR 65 | Temp 97.5°F | Resp 18

## 2015-12-05 DIAGNOSIS — Z9884 Bariatric surgery status: Secondary | ICD-10-CM

## 2015-12-05 DIAGNOSIS — D509 Iron deficiency anemia, unspecified: Secondary | ICD-10-CM

## 2015-12-05 MED ORDER — SODIUM CHLORIDE 0.9 % IV SOLN
200.0000 mg | Freq: Once | INTRAVENOUS | Status: AC
  Administered 2015-12-05: 200 mg via INTRAVENOUS
  Filled 2015-12-05: qty 10

## 2015-12-05 MED ORDER — SODIUM CHLORIDE 0.9 % IV SOLN
Freq: Once | INTRAVENOUS | Status: AC
  Administered 2015-12-05: 09:00:00 via INTRAVENOUS
  Filled 2015-12-05: qty 1000

## 2015-12-09 ENCOUNTER — Ambulatory Visit: Payer: BLUE CROSS/BLUE SHIELD | Admitting: Internal Medicine

## 2015-12-11 ENCOUNTER — Other Ambulatory Visit: Payer: Self-pay | Admitting: Family Medicine

## 2015-12-11 DIAGNOSIS — E119 Type 2 diabetes mellitus without complications: Secondary | ICD-10-CM

## 2015-12-12 ENCOUNTER — Inpatient Hospital Stay: Payer: BLUE CROSS/BLUE SHIELD | Attending: Internal Medicine

## 2015-12-12 VITALS — BP 120/69 | HR 69 | Temp 98.6°F | Resp 18

## 2015-12-12 DIAGNOSIS — D509 Iron deficiency anemia, unspecified: Secondary | ICD-10-CM | POA: Insufficient documentation

## 2015-12-12 DIAGNOSIS — Z79899 Other long term (current) drug therapy: Secondary | ICD-10-CM | POA: Insufficient documentation

## 2015-12-12 DIAGNOSIS — Z9884 Bariatric surgery status: Secondary | ICD-10-CM

## 2015-12-12 MED ORDER — SODIUM CHLORIDE 0.9 % IV SOLN
200.0000 mg | Freq: Once | INTRAVENOUS | Status: AC
  Administered 2015-12-12: 200 mg via INTRAVENOUS
  Filled 2015-12-12: qty 10

## 2015-12-12 MED ORDER — SODIUM CHLORIDE 0.9 % IV SOLN
Freq: Once | INTRAVENOUS | Status: AC
  Administered 2015-12-12: 09:00:00 via INTRAVENOUS
  Filled 2015-12-12: qty 1000

## 2015-12-18 ENCOUNTER — Ambulatory Visit: Payer: BLUE CROSS/BLUE SHIELD | Admitting: Gastroenterology

## 2015-12-18 ENCOUNTER — Ambulatory Visit: Payer: Self-pay | Admitting: Family Medicine

## 2015-12-19 ENCOUNTER — Inpatient Hospital Stay: Payer: BLUE CROSS/BLUE SHIELD

## 2015-12-19 VITALS — BP 137/79 | HR 74 | Temp 98.5°F | Resp 20

## 2015-12-19 DIAGNOSIS — Z9884 Bariatric surgery status: Secondary | ICD-10-CM

## 2015-12-19 DIAGNOSIS — D508 Other iron deficiency anemias: Secondary | ICD-10-CM

## 2015-12-19 MED ORDER — IRON SUCROSE 20 MG/ML IV SOLN
200.0000 mg | Freq: Once | INTRAVENOUS | Status: AC
  Administered 2015-12-19: 200 mg via INTRAVENOUS
  Filled 2015-12-19: qty 10

## 2015-12-19 MED ORDER — SODIUM CHLORIDE 0.9 % IV SOLN
Freq: Once | INTRAVENOUS | Status: AC
  Administered 2015-12-19: 09:00:00 via INTRAVENOUS
  Filled 2015-12-19: qty 1000

## 2015-12-19 MED ORDER — SODIUM CHLORIDE 0.9 % IV SOLN: 200.0000 mg | Freq: Once | INTRAVENOUS | Status: DC

## 2015-12-26 ENCOUNTER — Inpatient Hospital Stay: Payer: BLUE CROSS/BLUE SHIELD

## 2015-12-26 VITALS — BP 118/76 | HR 79 | Temp 97.4°F | Resp 18

## 2015-12-26 DIAGNOSIS — D508 Other iron deficiency anemias: Secondary | ICD-10-CM

## 2015-12-26 DIAGNOSIS — Z9884 Bariatric surgery status: Secondary | ICD-10-CM

## 2015-12-26 MED ORDER — IRON SUCROSE 20 MG/ML IV SOLN
200.0000 mg | Freq: Once | INTRAVENOUS | Status: AC
  Administered 2015-12-26: 200 mg via INTRAVENOUS

## 2015-12-26 MED ORDER — SODIUM CHLORIDE 0.9 % IV SOLN
Freq: Once | INTRAVENOUS | Status: AC
  Administered 2015-12-26: 11:00:00 via INTRAVENOUS
  Filled 2015-12-26: qty 1000

## 2015-12-26 NOTE — Patient Instructions (Signed)
Iron Sucrose injection  What is this medicine?  IRON SUCROSE (AHY ern SOO krohs) is an iron complex. Iron is used to make healthy red blood cells, which carry oxygen and nutrients throughout the body. This medicine is used to treat iron deficiency anemia in people with chronic kidney disease.  This medicine may be used for other purposes; ask your health care Asencion Loveday or pharmacist if you have questions.  What should I tell my health care Josslynn Mentzer before I take this medicine?  They need to know if you have any of these conditions:  -anemia not caused by low iron levels  -heart disease  -high levels of iron in the blood  -kidney disease  -liver disease  -an unusual or allergic reaction to iron, other medicines, foods, dyes, or preservatives  -pregnant or trying to get pregnant  -breast-feeding  How should I use this medicine?  This medicine is for infusion into a vein. It is given by a health care professional in a hospital or clinic setting.  Talk to your pediatrician regarding the use of this medicine in children. While this drug may be prescribed for children as young as 2 years for selected conditions, precautions do apply.  Overdosage: If you think you have taken too much of this medicine contact a poison control center or emergency room at once.  NOTE: This medicine is only for you. Do not share this medicine with others.  What if I miss a dose?  It is important not to miss your dose. Call your doctor or health care professional if you are unable to keep an appointment.  What may interact with this medicine?  Do not take this medicine with any of the following medications:  -deferoxamine  -dimercaprol  -other iron products  This medicine may also interact with the following medications:  -chloramphenicol  -deferasirox  This list may not describe all possible interactions. Give your health care Annica Marinello a list of all the medicines, herbs, non-prescription drugs, or dietary supplements you use. Also tell them if  you smoke, drink alcohol, or use illegal drugs. Some items may interact with your medicine.  What should I watch for while using this medicine?  Visit your doctor or healthcare professional regularly. Tell your doctor or healthcare professional if your symptoms do not start to get better or if they get worse. You may need blood work done while you are taking this medicine.  You may need to follow a special diet. Talk to your doctor. Foods that contain iron include: whole grains/cereals, dried fruits, beans, or peas, leafy green vegetables, and organ meats (liver, kidney).  What side effects may I notice from receiving this medicine?  Side effects that you should report to your doctor or health care professional as soon as possible:  -allergic reactions like skin rash, itching or hives, swelling of the face, lips, or tongue  -breathing problems  -changes in blood pressure  -cough  -fast, irregular heartbeat  -feeling faint or lightheaded, falls  -fever or chills  -flushing, sweating, or hot feelings  -joint or muscle aches/pains  -seizures  -swelling of the ankles or feet  -unusually weak or tired  Side effects that usually do not require medical attention (report to your doctor or health care professional if they continue or are bothersome):  -diarrhea  -feeling achy  -headache  -irritation at site where injected  -nausea, vomiting  -stomach upset  -tiredness  This list may not describe all possible side effects. Call your doctor   for medical advice about side effects. You may report side effects to FDA at 1-800-FDA-1088.  Where should I keep my medicine?  This drug is given in a hospital or clinic and will not be stored at home.  NOTE: This sheet is a summary. It may not cover all possible information. If you have questions about this medicine, talk to your doctor, pharmacist, or health care Hurley Blevins.      2016, Elsevier/Gold Standard. (2010-12-03 17:14:35)

## 2016-02-05 ENCOUNTER — Ambulatory Visit: Payer: BLUE CROSS/BLUE SHIELD | Admitting: Family Medicine

## 2016-02-13 ENCOUNTER — Ambulatory Visit: Payer: Self-pay | Admitting: Family Medicine

## 2016-02-17 ENCOUNTER — Other Ambulatory Visit: Payer: Self-pay | Admitting: *Deleted

## 2016-02-17 ENCOUNTER — Inpatient Hospital Stay: Payer: BLUE CROSS/BLUE SHIELD | Attending: Internal Medicine

## 2016-02-17 DIAGNOSIS — Z79899 Other long term (current) drug therapy: Secondary | ICD-10-CM | POA: Insufficient documentation

## 2016-02-17 DIAGNOSIS — D509 Iron deficiency anemia, unspecified: Secondary | ICD-10-CM | POA: Insufficient documentation

## 2016-02-17 LAB — IRON AND TIBC
Iron: 43 ug/dL (ref 28–170)
Saturation Ratios: 13 % (ref 10.4–31.8)
TIBC: 338 ug/dL (ref 250–450)
UIBC: 295 ug/dL

## 2016-02-17 LAB — CBC WITH DIFFERENTIAL/PLATELET
BASOS PCT: 1 %
Basophils Absolute: 0.1 10*3/uL (ref 0–0.1)
Eosinophils Absolute: 0.2 10*3/uL (ref 0–0.7)
Eosinophils Relative: 2 %
HEMATOCRIT: 43.4 % (ref 35.0–47.0)
Hemoglobin: 13.9 g/dL (ref 12.0–16.0)
Lymphocytes Relative: 30 %
Lymphs Abs: 2.4 10*3/uL (ref 1.0–3.6)
MCH: 26.7 pg (ref 26.0–34.0)
MCHC: 32.1 g/dL (ref 32.0–36.0)
MCV: 83.2 fL (ref 80.0–100.0)
MONO ABS: 0.6 10*3/uL (ref 0.2–0.9)
MONOS PCT: 7 %
NEUTROS ABS: 4.7 10*3/uL (ref 1.4–6.5)
Neutrophils Relative %: 60 %
Platelets: 271 10*3/uL (ref 150–440)
RBC: 5.22 MIL/uL — ABNORMAL HIGH (ref 3.80–5.20)
RDW: 21.2 % — AB (ref 11.5–14.5)
WBC: 7.9 10*3/uL (ref 3.6–11.0)

## 2016-02-17 LAB — FERRITIN: Ferritin: 32 ng/mL (ref 11–307)

## 2016-02-18 ENCOUNTER — Other Ambulatory Visit: Payer: Self-pay | Admitting: Family Medicine

## 2016-02-18 DIAGNOSIS — J302 Other seasonal allergic rhinitis: Secondary | ICD-10-CM

## 2016-02-20 ENCOUNTER — Other Ambulatory Visit: Payer: Self-pay | Admitting: *Deleted

## 2016-02-20 DIAGNOSIS — D509 Iron deficiency anemia, unspecified: Secondary | ICD-10-CM

## 2016-02-24 ENCOUNTER — Ambulatory Visit: Payer: BLUE CROSS/BLUE SHIELD | Admitting: Internal Medicine

## 2016-02-24 ENCOUNTER — Inpatient Hospital Stay: Payer: BLUE CROSS/BLUE SHIELD

## 2016-02-24 VITALS — BP 147/87 | HR 66 | Temp 98.1°F | Resp 20

## 2016-02-24 DIAGNOSIS — Z9884 Bariatric surgery status: Secondary | ICD-10-CM

## 2016-02-24 DIAGNOSIS — D508 Other iron deficiency anemias: Secondary | ICD-10-CM

## 2016-02-24 MED ORDER — IRON SUCROSE 20 MG/ML IV SOLN
200.0000 mg | Freq: Once | INTRAVENOUS | Status: AC
  Administered 2016-02-24: 200 mg via INTRAVENOUS
  Filled 2016-02-24: qty 10

## 2016-02-24 MED ORDER — SODIUM CHLORIDE 0.9 % IV SOLN: 200.0000 mg | Freq: Once | INTRAVENOUS | Status: DC

## 2016-02-24 MED ORDER — SODIUM CHLORIDE 0.9 % IV SOLN
Freq: Once | INTRAVENOUS | Status: AC
  Administered 2016-02-24: 14:00:00 via INTRAVENOUS
  Filled 2016-02-24: qty 1000

## 2016-03-02 ENCOUNTER — Other Ambulatory Visit: Payer: Self-pay | Admitting: Family Medicine

## 2016-03-02 DIAGNOSIS — E119 Type 2 diabetes mellitus without complications: Secondary | ICD-10-CM

## 2016-03-05 ENCOUNTER — Telehealth: Payer: Self-pay | Admitting: Family Medicine

## 2016-03-05 DIAGNOSIS — J302 Other seasonal allergic rhinitis: Secondary | ICD-10-CM

## 2016-03-05 DIAGNOSIS — F329 Major depressive disorder, single episode, unspecified: Secondary | ICD-10-CM

## 2016-03-05 DIAGNOSIS — F419 Anxiety disorder, unspecified: Secondary | ICD-10-CM

## 2016-03-05 DIAGNOSIS — E119 Type 2 diabetes mellitus without complications: Secondary | ICD-10-CM

## 2016-03-05 MED ORDER — DESLORATADINE 5 MG PO TABS: 5.0000 mg | ORAL_TABLET | Freq: Every day | ORAL | 5 refills | Status: DC

## 2016-03-05 MED ORDER — ESCITALOPRAM OXALATE 10 MG PO TABS: 10.0000 mg | ORAL_TABLET | Freq: Every day | ORAL | 1 refills | Status: DC

## 2016-03-05 MED ORDER — METFORMIN HCL 1000 MG PO TABS: 500.0000 mg | ORAL_TABLET | Freq: Two times a day (BID) | ORAL | 0 refills | Status: DC

## 2016-03-05 NOTE — Telephone Encounter (Signed)
Left voice message informing patient that prescriptions has been sent to pharmacy of her choice ([facility])

## 2016-03-05 NOTE — Telephone Encounter (Signed)
Prescription for Lexapro, Clarinex and metformin are sent to [facility] at [facility] in [place]

## 2016-03-05 NOTE — Telephone Encounter (Signed)
Patient scheduled appointment for 03/18/16. She is requesting refill on metformin 1000 mg 90 tablets, lexapro, clarinex. Will be completely out before her appointment (which is your first availability). Currently out of town and is asking that you send to [facility] on [address] [employer]

## 2016-03-16 ENCOUNTER — Other Ambulatory Visit: Payer: BLUE CROSS/BLUE SHIELD

## 2016-03-18 ENCOUNTER — Ambulatory Visit: Payer: Self-pay | Admitting: Family Medicine

## 2016-03-23 ENCOUNTER — Ambulatory Visit: Payer: BLUE CROSS/BLUE SHIELD

## 2016-03-23 ENCOUNTER — Ambulatory Visit: Payer: BLUE CROSS/BLUE SHIELD | Admitting: Internal Medicine

## 2016-03-23 ENCOUNTER — Other Ambulatory Visit: Payer: BLUE CROSS/BLUE SHIELD

## 2016-04-03 ENCOUNTER — Other Ambulatory Visit: Payer: Self-pay | Admitting: Family Medicine

## 2016-04-03 DIAGNOSIS — F329 Major depressive disorder, single episode, unspecified: Secondary | ICD-10-CM

## 2016-04-03 DIAGNOSIS — E119 Type 2 diabetes mellitus without complications: Secondary | ICD-10-CM

## 2016-04-03 DIAGNOSIS — F419 Anxiety disorder, unspecified: Principal | ICD-10-CM

## 2016-04-03 DIAGNOSIS — F32A Depression, unspecified: Secondary | ICD-10-CM

## 2016-04-12 ENCOUNTER — Ambulatory Visit: Payer: Self-pay | Admitting: Family Medicine

## 2016-04-13 ENCOUNTER — Other Ambulatory Visit: Payer: BLUE CROSS/BLUE SHIELD

## 2016-04-13 ENCOUNTER — Ambulatory Visit (INDEPENDENT_AMBULATORY_CARE_PROVIDER_SITE_OTHER): Payer: BLUE CROSS/BLUE SHIELD | Admitting: Family Medicine

## 2016-04-13 ENCOUNTER — Encounter: Payer: Self-pay | Admitting: Family Medicine

## 2016-04-13 VITALS — BP 141/81 | HR 96 | Temp 98.0°F | Resp 18 | Ht 62.0 in | Wt 181.1 lb

## 2016-04-13 DIAGNOSIS — J029 Acute pharyngitis, unspecified: Secondary | ICD-10-CM

## 2016-04-13 DIAGNOSIS — R6889 Other general symptoms and signs: Secondary | ICD-10-CM

## 2016-04-13 LAB — POCT INFLUENZA A/B
INFLUENZA B, POC: NEGATIVE
Influenza A, POC: NEGATIVE

## 2016-04-13 MED ORDER — AMOXICILLIN-POT CLAVULANATE 875-125 MG PO TABS: 1.0000 | ORAL_TABLET | Freq: Two times a day (BID) | ORAL | 0 refills | Status: DC

## 2016-04-13 NOTE — Progress Notes (Signed)
Name: Brooke Liu   MRN: [id number]    DOB: Dec 16, 1950   Date:04/13/2016       Progress Note  Subjective  Chief Complaint  Chief Complaint  Patient presents with  . Sore Throat  . Facial Swelling    Sore Throat   This is a new problem. The current episode started in the past 7 days (4 days). The problem has been unchanged. There has been no fever (did not check her temperature). Associated symptoms include headaches and swollen glands. Pertinent negatives include no congestion, coughing or shortness of breath. She has tried acetaminophen for the symptoms.    Past Medical History:  Diagnosis Date  . Anemia   . Anxiety   . Cancer ([place])    uterine ca  . Depression   . Diabetes mellitus, type II ([place])   . Headache    stress/sinus  . Hemorrhoids   . Motion sickness   . Multiple thyroid nodules   . Sleep apnea    past hx.  resolved with wt loss  . Vertigo    sinus related    Past Surgical History:  Procedure Laterality Date  . ABDOMINAL HYSTERECTOMY    . BACK SURGERY  2010   lumbar  . CESAREAN SECTION    . CHOLECYSTECTOMY    . COLONOSCOPY WITH PROPOFOL N/A 11/21/2015   Procedure: COLONOSCOPY WITH PROPOFOL;  Surgeon: Lucilla Lame, MD;  Location: [place];  Service: Endoscopy;  Laterality: N/A;  . ESOPHAGOGASTRODUODENOSCOPY (EGD) WITH PROPOFOL N/A 11/21/2015   Procedure: ESOPHAGOGASTRODUODENOSCOPY (EGD) WITH PROPOFOL;  Surgeon: Lucilla Lame, MD;  Location: [place];  Service: Endoscopy;  Laterality: N/A;  Diabetic - oral meds  . GASTRIC BYPASS  2000  . HERNIA REPAIR    . SMALL BOWEL REPAIR      Family History  Problem Relation Age of Onset  . Depression Mother   . Diabetes Mother   . Heart disease Mother   . Heart disease Father   . Diabetes Sister   . Diabetes Brother   . Dementia Brother   . Heart disease Brother   . Diabetes Sister   . Fibromyalgia Sister   . Diabetes Brother   . Heart disease Brother   . Diabetes Brother   .  Heart disease Brother   . Leukemia Brother   . Cancer Brother   . Liver disease Brother   . Alcohol abuse Brother   . Throat cancer Brother   . Heart disease Brother   . Diabetes Brother     Social History   Social History  . Marital status: Widowed    Spouse name: N/A  . Number of children: N/A  . Years of education: N/A   Occupational History  . Not on file.   Social History Main Topics  . Smoking status: Former Smoker    Years: 15.00    Types: Cigarettes    Quit date: 09/24/1977  . Smokeless tobacco: Never Used  . Alcohol use No  . Drug use: No  . Sexual activity: No   Other Topics Concern  . Not on file   Social History Narrative  . No narrative on file     Current Outpatient Prescriptions:  .  acetaminophen (TYLENOL) 500 MG tablet, Take 500 mg by mouth every 6 (six) hours as needed for mild pain, moderate pain or headache., Disp: , Rfl:  .  desloratadine (CLARINEX) 5 MG tablet, Take 1 tablet (5 mg total) by mouth daily., Disp: 30  tablet, Rfl: 5 .  escitalopram (LEXAPRO) 10 MG tablet, Take 1 tablet (10 mg total) by mouth daily., Disp: 90 tablet, Rfl: 1 .  fluticasone (FLONASE) 50 MCG/ACT nasal spray, Place 2 sprays into both nostrils daily., Disp: 16 g, Rfl: 2 .  metFORMIN (GLUCOPHAGE) 1000 MG tablet, Take 0.5 tablets (500 mg total) by mouth 2 (two) times daily with a meal., Disp: 90 tablet, Rfl: 0 .  mometasone (ELOCON) 0.1 % ointment, Apply topically daily., Disp: 45 g, Rfl: 2 .  Multiple Vitamin (MULTI-VITAMINS) TABS, Take by mouth., Disp: , Rfl:  .  Vitamin D, Ergocalciferol, (DRISDOL) 50000 units CAPS capsule, Take 1 capsule (50,000 Units total) by mouth once a week. For 3 months (Patient not taking: Reported on 04/13/2016), Disp: 24 capsule, Rfl: 0  Allergies  Allergen Reactions  . Oxycodone-Acetaminophen Hives    Very itchy underneath the skin.  Marland Kitchen Percocet [Oxycodone-Acetaminophen] Hives    Very itchy underneath the skin.  . Iodinated Diagnostic Agents  Itching    MRI Dye     Review of Systems  Constitutional: Positive for chills. Negative for fever.  HENT: Positive for sore throat. Negative for congestion and sinus pain.   Respiratory: Negative for cough, sputum production and shortness of breath.   Neurological: Positive for headaches.    Objective  Vitals:   04/13/16 1426  BP: (!) 141/81  Pulse: 96  Resp: 18  Temp: 98 F (36.7 C)  TempSrc: Oral  SpO2: 96%  Weight: 181 lb 1.6 oz (82.1 kg)  Height: 5\' 2"  (1.575 m)    Physical Exam  Constitutional: She is oriented to person, place, and time and well-developed, well-nourished, and in no distress.  HENT:  Right Ear: Tympanic membrane and ear canal normal. No drainage or tenderness.  Left Ear: Tympanic membrane and ear canal normal. No drainage or tenderness.  Mouth/Throat: Oropharyngeal exudate (whitish exudtae at posterior oropharynx.) and posterior oropharyngeal erythema present.  Cardiovascular: Normal rate, regular rhythm, S1 normal, S2 normal and normal heart sounds.   No murmur heard. Pulmonary/Chest: Effort normal and breath sounds normal. She has no wheezes. She has no rhonchi.  Lymphadenopathy:    She has no cervical adenopathy.  Neurological: She is alert and oriented to person, place, and time.  Nursing note and vitals reviewed.    Assessment & Plan  1. Flu-like symptoms Plan discussed flu testing is negative - POCT Influenza A/B  2. Pharyngitis, unspecified etiology Likely viral pharyngitis, however if it fails to respond to conservative treatment, recommended that she should start taking the antibiotic. Prescription provided. - amoxicillin-clavulanate (AUGMENTIN) 875-125 MG tablet; Take 1 tablet by mouth 2 (two) times daily.  Dispense: 20 tablet; Refill: 0   Brooke Liu [facility] 04/13/2016 2:56 PM

## 2016-04-16 ENCOUNTER — Other Ambulatory Visit: Payer: Self-pay

## 2016-04-20 ENCOUNTER — Inpatient Hospital Stay: Payer: Self-pay

## 2016-04-20 ENCOUNTER — Inpatient Hospital Stay: Payer: Self-pay | Admitting: Internal Medicine

## 2016-04-27 ENCOUNTER — Ambulatory Visit: Payer: Self-pay

## 2016-04-27 ENCOUNTER — Ambulatory Visit: Payer: Self-pay | Admitting: Internal Medicine

## 2016-05-04 ENCOUNTER — Inpatient Hospital Stay: Payer: BLUE CROSS/BLUE SHIELD

## 2016-05-04 ENCOUNTER — Encounter: Payer: Self-pay | Admitting: Family Medicine

## 2016-05-04 ENCOUNTER — Inpatient Hospital Stay: Payer: BLUE CROSS/BLUE SHIELD | Attending: Internal Medicine | Admitting: Internal Medicine

## 2016-05-04 ENCOUNTER — Ambulatory Visit (INDEPENDENT_AMBULATORY_CARE_PROVIDER_SITE_OTHER): Payer: BLUE CROSS/BLUE SHIELD | Admitting: Family Medicine

## 2016-05-04 ENCOUNTER — Ambulatory Visit: Payer: Self-pay | Admitting: Internal Medicine

## 2016-05-04 VITALS — BP 144/79 | HR 67 | Resp 18

## 2016-05-04 VITALS — BP 140/76 | HR 88 | Temp 97.3°F | Resp 17 | Ht 62.0 in | Wt 178.4 lb

## 2016-05-04 VITALS — BP 118/75 | HR 70 | Temp 97.2°F | Wt 184.7 lb

## 2016-05-04 DIAGNOSIS — F419 Anxiety disorder, unspecified: Secondary | ICD-10-CM

## 2016-05-04 DIAGNOSIS — L309 Dermatitis, unspecified: Secondary | ICD-10-CM

## 2016-05-04 DIAGNOSIS — F418 Other specified anxiety disorders: Secondary | ICD-10-CM | POA: Diagnosis not present

## 2016-05-04 DIAGNOSIS — E119 Type 2 diabetes mellitus without complications: Secondary | ICD-10-CM | POA: Diagnosis not present

## 2016-05-04 DIAGNOSIS — R9082 White matter disease, unspecified: Secondary | ICD-10-CM | POA: Insufficient documentation

## 2016-05-04 DIAGNOSIS — J301 Allergic rhinitis due to pollen: Secondary | ICD-10-CM | POA: Diagnosis not present

## 2016-05-04 DIAGNOSIS — D508 Other iron deficiency anemias: Secondary | ICD-10-CM

## 2016-05-04 DIAGNOSIS — Z9884 Bariatric surgery status: Secondary | ICD-10-CM

## 2016-05-04 DIAGNOSIS — E041 Nontoxic single thyroid nodule: Secondary | ICD-10-CM | POA: Diagnosis not present

## 2016-05-04 DIAGNOSIS — Z90710 Acquired absence of both cervix and uterus: Secondary | ICD-10-CM | POA: Insufficient documentation

## 2016-05-04 DIAGNOSIS — R918 Other nonspecific abnormal finding of lung field: Secondary | ICD-10-CM | POA: Diagnosis not present

## 2016-05-04 DIAGNOSIS — Z79899 Other long term (current) drug therapy: Secondary | ICD-10-CM | POA: Diagnosis not present

## 2016-05-04 DIAGNOSIS — K909 Intestinal malabsorption, unspecified: Secondary | ICD-10-CM | POA: Insufficient documentation

## 2016-05-04 DIAGNOSIS — R42 Dizziness and giddiness: Secondary | ICD-10-CM | POA: Diagnosis not present

## 2016-05-04 DIAGNOSIS — Z8542 Personal history of malignant neoplasm of other parts of uterus: Secondary | ICD-10-CM | POA: Insufficient documentation

## 2016-05-04 DIAGNOSIS — F329 Major depressive disorder, single episode, unspecified: Secondary | ICD-10-CM | POA: Diagnosis not present

## 2016-05-04 DIAGNOSIS — Z87891 Personal history of nicotine dependence: Secondary | ICD-10-CM | POA: Insufficient documentation

## 2016-05-04 DIAGNOSIS — R5383 Other fatigue: Secondary | ICD-10-CM | POA: Diagnosis not present

## 2016-05-04 DIAGNOSIS — Z809 Family history of malignant neoplasm, unspecified: Secondary | ICD-10-CM | POA: Insufficient documentation

## 2016-05-04 DIAGNOSIS — Z7984 Long term (current) use of oral hypoglycemic drugs: Secondary | ICD-10-CM | POA: Insufficient documentation

## 2016-05-04 DIAGNOSIS — R930 Abnormal findings on diagnostic imaging of skull and head, not elsewhere classified: Secondary | ICD-10-CM

## 2016-05-04 DIAGNOSIS — D509 Iron deficiency anemia, unspecified: Secondary | ICD-10-CM

## 2016-05-04 DIAGNOSIS — F32A Depression, unspecified: Secondary | ICD-10-CM

## 2016-05-04 DIAGNOSIS — Z7982 Long term (current) use of aspirin: Secondary | ICD-10-CM | POA: Insufficient documentation

## 2016-05-04 DIAGNOSIS — Z90722 Acquired absence of ovaries, bilateral: Secondary | ICD-10-CM | POA: Insufficient documentation

## 2016-05-04 LAB — GLUCOSE, POCT (MANUAL RESULT ENTRY): POC Glucose: 114 mg/dl — AB (ref 70–99)

## 2016-05-04 LAB — POCT GLYCOSYLATED HEMOGLOBIN (HGB A1C)

## 2016-05-04 MED ORDER — IRON SUCROSE 20 MG/ML IV SOLN: 200.0000 mg | Freq: Once | INTRAVENOUS | Status: DC

## 2016-05-04 MED ORDER — TRIAMCINOLONE ACETONIDE 0.1 % EX CREA: 1.0000 "application " | TOPICAL_CREAM | Freq: Two times a day (BID) | CUTANEOUS | 0 refills | Status: DC

## 2016-05-04 MED ORDER — ESCITALOPRAM OXALATE 10 MG PO TABS: 10.0000 mg | ORAL_TABLET | Freq: Every day | ORAL | 1 refills | Status: DC

## 2016-05-04 MED ORDER — METFORMIN HCL 1000 MG PO TABS: 500.0000 mg | ORAL_TABLET | Freq: Two times a day (BID) | ORAL | 0 refills | Status: DC

## 2016-05-04 MED ORDER — IRON SUCROSE 20 MG/ML IV SOLN: 100.0000 mg | Freq: Once | INTRAVENOUS | Status: DC

## 2016-05-04 MED ORDER — IRON SUCROSE 20 MG/ML IV SOLN
200.0000 mg | Freq: Once | INTRAVENOUS | Status: AC
  Administered 2016-05-04: 200 mg via INTRAVENOUS

## 2016-05-04 MED ORDER — FLUTICASONE PROPIONATE 50 MCG/ACT NA SUSP: 2.0000 | Freq: Every day | NASAL | 2 refills | Status: AC

## 2016-05-04 MED ORDER — SODIUM CHLORIDE 0.9 % IV SOLN
Freq: Once | INTRAVENOUS | Status: AC
  Administered 2016-05-04: 10:00:00 via INTRAVENOUS
  Filled 2016-05-04: qty 1000

## 2016-05-04 NOTE — Assessment & Plan Note (Addendum)
Sec to gastric bypass- patient is symptomatic. She does not tolerate by mouth iron. Recommend IV Venofer today. Potential infusion reactions were previously discussed with patient.  # recommend follow up labs in 4 months/possible IV. venofer infusion.  & Intermittent sweating: follow up with PCP  & Concentration difficulties/irritability: follow up with PCP for possible referral to neurologist.  # Thank you Dr.Shah for allowing me to participate in the care of your pleasant patient. Please do not hesitate to contact me with questions or concerns in the interim.

## 2016-05-04 NOTE — Progress Notes (Signed)
Name: Brooke Liu   MRN: [id number]    DOB: [birth year]   Date:05/04/2016       Progress Note  Subjective  Chief Complaint  Chief Complaint  Patient presents with  . Follow-up    1 mo  . Medication Refill    Diabetes  She presents for her follow-up diabetic visit. She has type 2 diabetes mellitus. Her disease course has been stable. There are no hypoglycemic associated symptoms. Pertinent negatives for diabetes include no fatigue, no foot paresthesias, no polydipsia, no polyphagia and no polyuria. Current diabetic treatment includes oral agent (monotherapy). She is following a diabetic diet. She participates in exercise daily. Her breakfast blood glucose range is generally 110-130 mg/dl. Her lunch blood glucose range is generally 140-180 mg/dl. An ACE inhibitor/angiotensin II receptor blocker is not being taken.  Hyperlipidemia  This is a chronic problem. The problem is uncontrolled. Recent lipid tests were reviewed and are high. Exacerbating diseases include diabetes. Pertinent negatives include no myalgias. She is currently on no antihyperlipidemic treatment.  Depression         This is a chronic problem.  The onset quality is gradual. The problem is unchanged.  Associated symptoms include no fatigue, no helplessness, no appetite change, no body aches, no myalgias and not sad.  Past treatments include SSRIs - Selective serotonin reuptake inhibitors.  Pt. Is also requesting prescription for Triamcinolone cream for eczema on her hands, uses it sparingly, helps relieve the rash, no side effects reported.     Past Medical History:  Diagnosis Date  . Anemia   . Anxiety   . Cancer ([place])    uterine ca  . Depression   . Diabetes mellitus, type II ([place])   . Headache    stress/sinus  . Hemorrhoids   . Motion sickness   . Multiple thyroid nodules   . Sleep apnea    past hx.  resolved with wt loss  . Vertigo    sinus related    Past Surgical History:  Procedure Laterality  Date  . ABDOMINAL HYSTERECTOMY    . BACK SURGERY  2010   lumbar  . CESAREAN SECTION    . CHOLECYSTECTOMY    . COLONOSCOPY WITH PROPOFOL N/A 11/21/2015   Procedure: COLONOSCOPY WITH PROPOFOL;  Surgeon: Lucilla Lame, MD;  Location: [place];  Service: Endoscopy;  Laterality: N/A;  . ESOPHAGOGASTRODUODENOSCOPY (EGD) WITH PROPOFOL N/A 11/21/2015   Procedure: ESOPHAGOGASTRODUODENOSCOPY (EGD) WITH PROPOFOL;  Surgeon: Lucilla Lame, MD;  Location: [place];  Service: Endoscopy;  Laterality: N/A;  Diabetic - oral meds  . GASTRIC BYPASS  2000  . HERNIA REPAIR    . SMALL BOWEL REPAIR      Family History  Problem Relation Age of Onset  . Depression Mother   . Diabetes Mother   . Heart disease Mother   . Heart disease Father   . Diabetes Sister   . Diabetes Brother   . Dementia Brother   . Heart disease Brother   . Diabetes Sister   . Fibromyalgia Sister   . Diabetes Brother   . Heart disease Brother   . Diabetes Brother   . Heart disease Brother   . Leukemia Brother   . Cancer Brother   . Liver disease Brother   . Alcohol abuse Brother   . Throat cancer Brother   . Heart disease Brother   . Diabetes Brother     Social History   Social History  . Marital status: Widowed  Spouse name: N/A  . Number of children: N/A  . Years of education: N/A   Occupational History  . Not on file.   Social History Main Topics  . Smoking status: Former Smoker    Years: 15.00    Types: Cigarettes    Quit date: 09/24/1977  . Smokeless tobacco: Never Used  . Alcohol use No  . Drug use: No  . Sexual activity: No   Other Topics Concern  . Not on file   Social History Narrative  . No narrative on file     Current Outpatient Prescriptions:  .  acetaminophen (TYLENOL) 500 MG tablet, Take 500 mg by mouth every 6 (six) hours as needed for mild pain, moderate pain or headache., Disp: , Rfl:  .  desloratadine (CLARINEX) 5 MG tablet, Take 1 tablet (5 mg total) by mouth  daily., Disp: 30 tablet, Rfl: 5 .  escitalopram (LEXAPRO) 10 MG tablet, Take 1 tablet (10 mg total) by mouth daily., Disp: 90 tablet, Rfl: 1 .  fluticasone (FLONASE) 50 MCG/ACT nasal spray, Place 2 sprays into both nostrils daily., Disp: 16 g, Rfl: 2 .  metFORMIN (GLUCOPHAGE) 1000 MG tablet, Take 0.5 tablets (500 mg total) by mouth 2 (two) times daily with a meal., Disp: 90 tablet, Rfl: 0 .  Multiple Vitamin (MULTI-VITAMINS) TABS, Take by mouth., Disp: , Rfl:  .  triamcinolone cream (KENALOG) 0.1 %, Apply 1 application topically 2 (two) times daily., Disp: , Rfl:  .  aspirin EC 81 MG tablet, Take 81 mg by mouth daily., Disp: , Rfl:  .  IRON PO, Take 325 mg by mouth daily., Disp: , Rfl:  .  mometasone (ELOCON) 0.1 % ointment, Apply topically daily. (Patient not taking: Reported on 05/04/2016), Disp: 45 g, Rfl: 2  Allergies  Allergen Reactions  . Oxycodone-Acetaminophen Hives    Very itchy underneath the skin.  Marland Kitchen Percocet [Oxycodone-Acetaminophen] Hives    Very itchy underneath the skin.  . Iodinated Diagnostic Agents Itching    MRI Dye     Review of Systems  Constitutional: Negative for appetite change and fatigue.  Musculoskeletal: Negative for myalgias.  Endo/Heme/Allergies: Negative for polydipsia and polyphagia.  Psychiatric/Behavioral: Positive for depression.     Objective  Vitals:   05/04/16 1323  BP: 140/76  Pulse: 88  Resp: 17  Temp: 97.3 F (36.3 C)  TempSrc: Oral  SpO2: 96%  Weight: 178 lb 6.4 oz (80.9 kg)  Height: 5\' 2"  (1.575 m)    Physical Exam  Constitutional: She is oriented to person, place, and time and well-developed, well-nourished, and in no distress.  HENT:  Head: Normocephalic and atraumatic.  Cardiovascular: Normal rate, regular rhythm and normal heart sounds.   No murmur heard. Pulmonary/Chest: Effort normal and breath sounds normal.  Neurological: She is alert and oriented to person, place, and time.  Psychiatric: Mood, memory, affect and  judgment normal.  Nursing note and vitals reviewed.      Assessment & Plan  1. Chronic seasonal allergic rhinitis due to pollen Takes Flonase for seasonal allergies.  - fluticasone (FLONASE) 50 MCG/ACT nasal spray; Place 2 sprays into both nostrils daily.  Dispense: 16 g; Refill: 2  2. Anxiety and depression Reported stable and controlled on therapy. - escitalopram (LEXAPRO) 10 MG tablet; Take 1 tablet (10 mg total) by mouth daily.  Dispense: 90 tablet; Refill: 1  3. Eczema of both hands Refill for Kenalog cream provided, use as directed for eczema. - triamcinolone cream (KENALOG) 0.1 %; Apply  1 application topically 2 (two) times daily.  Dispense: 30 g; Refill: 0  4. Type 2 diabetes mellitus without complication, without long-term current use of insulin (HCC) Point-of-care Ac could not be read by the machine, glucose is relatively normal at 114 mg/dL, continue on metformin, order A1c from the lab. - POCT HgB A1C - POCT Glucose (CBG) - metFORMIN (GLUCOPHAGE) 1000 MG tablet; Take 0.5 tablets (500 mg total) by mouth 2 (two) times daily with a meal.  Dispense: 90 tablet; Refill: 0 - Lipid Profile - COMPLETE METABOLIC PANEL WITH GFR - HgB A1c  5. White matter abnormality on MRI of brain She is not happy with Dr. Lannie Fields interpretation of her brain MRI, would refer to Dr. Manuella Ghazi for follow up. - Ambulatory referral to Neurology    [facility] 05/04/2016 1:38 PM

## 2016-05-04 NOTE — Progress Notes (Signed)
[place] NOTE  Patient Care Team: Roselee Nova, MD as PCP - General (Family Medicine)  CHIEF COMPLAINTS/PURPOSE OF CONSULTATION:   # IDA sec to malabsorption START IVE venofer sep 2017 [2017- EGD/colo-NEG]  # UTERINE CA [s/p TAH & BSO; no chemo/RT]  # Gastric Bypass [2002] ; Lung nodule; Thyroid nodule [[facility]]  No history exists.     HISTORY OF PRESENTING ILLNESS:  Brooke Liu 66 y.o. female with previous history of gastric bypass is here for follow up, further evaluation, and recommendations for iron deficiency anemia.  Patient admits to continuing mild fatigue, much improved since last IV iron infusion in September. Denies any blood in stools or black stools. Denies any blood in urine. Continues to complain of difficulty in concentration and irritability. Appetite remains good, no weight loss. No nausea or vomiting.   She reports sudden occasional sweating on forehead and underarms for the past few months, of different quality than hot flashes.  They are self-resolving.  ROS: A complete 10 point review of system is done which is negative except mentioned above in history of present illness  MEDICAL HISTORY:  Past Medical History:  Diagnosis Date  . Anemia   . Anxiety   . Cancer ([place])    uterine ca  . Depression   . Diabetes mellitus, type II ([place])   . Headache    stress/sinus  . Hemorrhoids   . Motion sickness   . Multiple thyroid nodules   . Sleep apnea    past hx.  resolved with wt loss  . Vertigo    sinus related    SURGICAL HISTORY: Past Surgical History:  Procedure Laterality Date  . ABDOMINAL HYSTERECTOMY    . BACK SURGERY  2010   lumbar  . CESAREAN SECTION    . CHOLECYSTECTOMY    . COLONOSCOPY WITH PROPOFOL N/A 11/21/2015   Procedure: COLONOSCOPY WITH PROPOFOL;  Surgeon: Lucilla Lame, MD;  Location: [place];  Service: Endoscopy;  Laterality: N/A;  . ESOPHAGOGASTRODUODENOSCOPY (EGD) WITH PROPOFOL N/A  11/21/2015   Procedure: ESOPHAGOGASTRODUODENOSCOPY (EGD) WITH PROPOFOL;  Surgeon: Lucilla Lame, MD;  Location: [place];  Service: Endoscopy;  Laterality: N/A;  Diabetic - oral meds  . GASTRIC BYPASS  2000  . HERNIA REPAIR    . SMALL BOWEL REPAIR      SOCIAL HISTORY: Works as a Surveyor, minerals. No smoking or alcohol. Social History   Social History  . Marital status: Widowed    Spouse name: N/A  . Number of children: N/A  . Years of education: N/A   Occupational History  . Not on file.   Social History Main Topics  . Smoking status: Former Smoker    Years: 15.00    Types: Cigarettes    Quit date: 09/24/1977  . Smokeless tobacco: Never Used  . Alcohol use No  . Drug use: No  . Sexual activity: No   Other Topics Concern  . Not on file   Social History Narrative  . No narrative on file    FAMILY HISTORY: Family History  Problem Relation Age of Onset  . Depression Mother   . Diabetes Mother   . Heart disease Mother   . Heart disease Father   . Diabetes Sister   . Diabetes Brother   . Dementia Brother   . Heart disease Brother   . Diabetes Sister   . Fibromyalgia Sister   . Diabetes Brother   . Heart disease Brother   .  Diabetes Brother   . Heart disease Brother   . Leukemia Brother   . Cancer Brother   . Liver disease Brother   . Alcohol abuse Brother   . Throat cancer Brother   . Heart disease Brother   . Diabetes Brother     ALLERGIES:  is allergic to oxycodone-acetaminophen; percocet [oxycodone-acetaminophen]; and iodinated diagnostic agents.  MEDICATIONS:  Current Outpatient Prescriptions  Medication Sig Dispense Refill  . acetaminophen (TYLENOL) 500 MG tablet Take 500 mg by mouth every 6 (six) hours as needed for mild pain, moderate pain or headache.    Marland Kitchen aspirin EC 81 MG tablet Take 81 mg by mouth daily.    Marland Kitchen desloratadine (CLARINEX) 5 MG tablet Take 1 tablet (5 mg total) by mouth daily. 30 tablet 5  . escitalopram (LEXAPRO) 10 MG tablet Take 1  tablet (10 mg total) by mouth daily. 90 tablet 1  . fluticasone (FLONASE) 50 MCG/ACT nasal spray Place 2 sprays into both nostrils daily. 16 g 2  . IRON PO Take 325 mg by mouth daily.    . metFORMIN (GLUCOPHAGE) 1000 MG tablet Take 0.5 tablets (500 mg total) by mouth 2 (two) times daily with a meal. 90 tablet 0  . mometasone (ELOCON) 0.1 % ointment Apply topically daily. 45 g 2  . Multiple Vitamin (MULTI-VITAMINS) TABS Take by mouth.     Current Facility-Administered Medications  Medication Dose Route Frequency Provider Last Rate Last Dose  . iron sucrose (VENOFER) injection 200 mg  200 mg Intravenous Once Cammie Sickle, MD       Facility-Administered Medications Ordered in Other Visits  Medication Dose Route Frequency Provider Last Rate Last Dose  . 0.9 %  sodium chloride infusion   Intravenous Once Cammie Sickle, MD      . iron sucrose (VENOFER) injection 200 mg  200 mg Intravenous Once Cammie Sickle, MD          .  PHYSICAL EXAMINATION: ECOG PERFORMANCE STATUS: 1 - Symptomatic but completely ambulatory  Vitals:   05/04/16 0846  BP: 118/75  Pulse: 70  Temp: 97.2 F (36.2 C)   Filed Weights   05/04/16 0846  Weight: 184 lb 11.9 oz (83.8 kg)    GENERAL: Well-nourished well-developed; Alert, no distress and comfortable.   alone EYES: no pallor or icterus OROPHARYNX: no thrush or ulceration; good dentition  NECK: supple, no masses felt LYMPH:  no palpable lymphadenopathy in the cervical, axillary or inguinal regions LUNGS: clear to auscultation and  No wheeze or crackles HEART/CVS: regular rate & rhythm and no murmurs; No lower extremity edema ABDOMEN: abdomen soft, non-tender and normal bowel sounds Musculoskeletal:no cyanosis of digits and no clubbing  PSYCH: alert & oriented x 3 with fluent speech NEURO: no focal motor/sensory deficits SKIN:  no rashes or significant lesions  LABORATORY DATA:  I have reviewed the data as listed Lab Results   Component Value Date   WBC 7.9 02/17/2016   HGB 13.9 02/17/2016   HCT 43.4 02/17/2016   MCV 83.2 02/17/2016   PLT 271 02/17/2016    Recent Labs  09/16/15 1224  NA 138  K 5.0  CL 104  CO2 26  GLUCOSE 85  BUN 13  CREATININE 0.66  CALCIUM 9.0  PROT 7.0  ALBUMIN 4.1  AST 16  ALT 11  ALKPHOS 96  BILITOT 0.5    RADIOGRAPHIC STUDIES: No results found.  ASSESSMENT & PLAN:   Iron deficiency anemia Sec to gastric bypass- patient is  symptomatic. She does not tolerate by mouth iron. Recommend IV Venofer today. Potential infusion reactions were previously discussed with patient.  # recommend follow up labs in 4 months/possible IV. venofer infusion.  & Intermittent sweating: follow up with PCP  & Concentration difficulties/irritability: follow up with PCP for possible referral to neurologist.  # Thank you Dr.Shah for allowing me to participate in the care of your pleasant patient. Please do not hesitate to contact me with questions or concerns in the interim.     Lucendia Herrlich, NP 05/04/2016 9:41 AM

## 2016-05-04 NOTE — Progress Notes (Signed)
Patient here today for follow up.  Patient states no new concerns today  

## 2016-05-12 ENCOUNTER — Telehealth: Payer: Self-pay | Admitting: Family Medicine

## 2016-05-12 NOTE — Telephone Encounter (Signed)
Checking status on lab work please return call [phone]. She is at work so if she does not answer it is okay to leave detailed message.

## 2016-05-14 ENCOUNTER — Encounter: Payer: Self-pay | Admitting: Family Medicine

## 2016-05-14 NOTE — Telephone Encounter (Signed)
Returned call and discussed lab results, hemoglobin A1c is 5.6% considered normal. Patient reassured

## 2016-05-14 NOTE — Telephone Encounter (Signed)
Pt still have not heard from anyone pertaining to lab results for her Brooke Liu. She is asking that someone please return call [phone]

## 2016-06-30 ENCOUNTER — Encounter: Payer: Self-pay | Admitting: Family Medicine

## 2016-06-30 ENCOUNTER — Ambulatory Visit
Admission: RE | Admit: 2016-06-30 | Discharge: 2016-06-30 | Disposition: A | Discharge status: Home / Self Care | Payer: BLUE CROSS/BLUE SHIELD | Source: Ambulatory Visit | Attending: Family Medicine | Admitting: Family Medicine

## 2016-06-30 ENCOUNTER — Telehealth: Payer: Self-pay

## 2016-06-30 ENCOUNTER — Ambulatory Visit (INDEPENDENT_AMBULATORY_CARE_PROVIDER_SITE_OTHER): Payer: BLUE CROSS/BLUE SHIELD | Admitting: Family Medicine

## 2016-06-30 VITALS — BP 118/70 | HR 80 | Temp 97.7°F | Resp 16 | Ht 62.0 in | Wt 179.0 lb

## 2016-06-30 DIAGNOSIS — M25552 Pain in left hip: Secondary | ICD-10-CM

## 2016-06-30 DIAGNOSIS — S0990XA Unspecified injury of head, initial encounter: Secondary | ICD-10-CM | POA: Insufficient documentation

## 2016-06-30 DIAGNOSIS — J0100 Acute maxillary sinusitis, unspecified: Secondary | ICD-10-CM

## 2016-06-30 MED ORDER — AZITHROMYCIN 250 MG PO TABS: ORAL_TABLET | ORAL | 0 refills | Status: DC

## 2016-06-30 NOTE — Telephone Encounter (Signed)
Note cont.   Imaging will be done at the [facility]. patient was informed via voicemail and was given the number to centralize scheduling in case she needed to change the appt date and time.

## 2016-06-30 NOTE — Telephone Encounter (Signed)
Patient has been scheduled to have her CT on 07/07/16 @ 8:30am

## 2016-06-30 NOTE — Progress Notes (Signed)
Name: Brooke Liu   MRN: [id number]    DOB: [birth year]   Date:06/30/2016       Progress Note  Subjective  Chief Complaint  Chief Complaint  Patient presents with  . Sinus Problem    possible infection     Sinusitis  This is a chronic problem. The current episode started 1 to 4 weeks ago (a few weeks ago). The problem is unchanged. There has been no fever. Associated symptoms include congestion (drainage in throat), headaches and sinus pressure. Pertinent negatives include no sore throat. Past treatments include nothing.  Fall  Fall occurred: fell twice in the past 2 weeks, one at work and another at home. She landed on hard floor. There was no blood loss. The point of impact was the head and left hip. The pain is present in the head and left hip. The pain is moderate. The symptoms are aggravated by pressure on injury. Associated symptoms include headaches. Pertinent negatives include no fever.     Past Medical History:  Diagnosis Date  . Anemia   . Anxiety   . 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 Depression   . Diabetes mellitus, type II ([place])   . Headache    stress/sinus  . Hemorrhoids   . Motion sickness   . Multiple thyroid nodules   . Sleep apnea    past hx.  resolved with wt loss  . Vertigo    sinus related    Past Surgical History:  Procedure Laterality Date  . ABDOMINAL HYSTERECTOMY    . BACK SURGERY  2010   lumbar  . CESAREAN SECTION    . CHOLECYSTECTOMY    . COLONOSCOPY WITH PROPOFOL N/A 11/21/2015   Procedure: COLONOSCOPY WITH PROPOFOL;  Surgeon: Lucilla Lame, MD;  Location: [facility];  Service: Endoscopy;  Laterality: N/A;  . ESOPHAGOGASTRODUODENOSCOPY (EGD) WITH PROPOFOL N/A 11/21/2015   Procedure: ESOPHAGOGASTRODUODENOSCOPY (EGD) WITH PROPOFOL;  Surgeon: Lucilla Lame, MD;  Location: [facility];  Service: Endoscopy;  Laterality: N/A;  Diabetic - oral meds  . GASTRIC BYPASS  2000  . HERNIA REPAIR    . SMALL BOWEL REPAIR      Family  History  Problem Relation Age of Onset  . Depression Mother   . Diabetes Mother   . Heart disease Mother   . Heart disease Father   . Diabetes Sister   . Diabetes Brother   . Dementia Brother   . Heart disease Brother   . Diabetes Sister   . Fibromyalgia Sister   . Diabetes Brother   . Heart disease Brother   . Diabetes Brother   . Heart disease Brother   . Leukemia Brother   . Cancer Brother   . Liver disease Brother   . Alcohol abuse Brother   . Throat cancer Brother   . Heart disease Brother   . Diabetes Brother     Social History   Social History  . Marital status: Widowed    Spouse name: N/A  . Number of children: N/A  . Years of education: N/A   Occupational History  . Not on file.   Social History Main Topics  . Smoking status: Former Smoker    Years: 15.00    Types: Cigarettes    Quit date: 09/24/1977  . Smokeless tobacco: Never Used  . Alcohol use No  . Drug use: No  . Sexual activity: No   Other Topics Concern  . Not on file  Social History Narrative  . No narrative on file     Current Outpatient Prescriptions:  .  acetaminophen (TYLENOL) 500 MG tablet, Take 500 mg by mouth every 6 (six) hours as needed for mild pain, moderate pain or headache., Disp: , Rfl:  .  aspirin EC 81 MG tablet, Take 81 mg by mouth daily., Disp: , Rfl:  .  desloratadine (CLARINEX) 5 MG tablet, Take 1 tablet (5 mg total) by mouth daily., Disp: 30 tablet, Rfl: 5 .  escitalopram (LEXAPRO) 10 MG tablet, Take 1 tablet (10 mg total) by mouth daily., Disp: 90 tablet, Rfl: 1 .  fluticasone (FLONASE) 50 MCG/ACT nasal spray, Place 2 sprays into both nostrils daily., Disp: 16 g, Rfl: 2 .  IRON PO, Take 325 mg by mouth daily., Disp: , Rfl:  .  metFORMIN (GLUCOPHAGE) 1000 MG tablet, Take 0.5 tablets (500 mg total) by mouth 2 (two) times daily with a meal., Disp: 90 tablet, Rfl: 0 .  mometasone (ELOCON) 0.1 % ointment, Apply topically daily., Disp: 45 g, Rfl: 2 .  Multiple Vitamin  (MULTI-VITAMINS) TABS, Take by mouth., Disp: , Rfl:  .  triamcinolone cream (KENALOG) 0.1 %, Apply 1 application topically 2 (two) times daily., Disp: 30 g, Rfl: 0  Allergies  Allergen Reactions  . Oxycodone-Acetaminophen Hives    Very itchy underneath the skin.  Marland Kitchen Percocet [Oxycodone-Acetaminophen] Hives    Very itchy underneath the skin.  . Iodinated Diagnostic Agents Itching    MRI Dye     Review of Systems  Constitutional: Negative for fever.  HENT: Positive for congestion (drainage in throat) and sinus pressure. Negative for sore throat.   Neurological: Positive for headaches.     Objective  Vitals:   06/30/16 1508  BP: 118/70  Pulse: 80  Resp: 16  Temp: 97.7 F (36.5 C)  TempSrc: Oral  SpO2: 96%  Weight: 179 lb (81.2 kg)  Height: 5\' 2"  (1.575 m)    Physical Exam  Constitutional: She is oriented to person, place, and time and well-developed, well-nourished, and in no distress.  HENT:  Head: Normocephalic and atraumatic.  Nose: Right sinus exhibits maxillary sinus tenderness. Left sinus exhibits maxillary sinus tenderness.  Mouth/Throat: Oropharynx is clear and moist.  Nasal turbinates normal, mild mucosal inflammation Mild tenderness to deep palpation over the left posterolateral skull, no signs of inflammation  Neck: Neck supple.  Cardiovascular: Normal rate, regular rhythm, S1 normal and S2 normal.   Pulmonary/Chest: Breath sounds normal. She has no wheezes.  Musculoskeletal:       Left hip: She exhibits tenderness. She exhibits normal strength, no swelling and no deformity.       Legs: Neurological: She is alert and oriented to person, place, and time.  Psychiatric: Mood, memory, affect and judgment normal.     Assessment & Plan  1. Injury of head, initial encounter No loss of consciousness, obtain CT of brain to evaluate the skull and brain - CT Head Wo Contrast; Future  2. Acute non-recurrent maxillary sinusitis Started on azithromycin -  azithromycin (ZITHROMAX) 250 MG tablet; 2 tabs po day 1,then 1 tab po q day x 4 days  Dispense: 6 tablet; Refill: 0  3. Acute hip pain, left  - DG HIP UNILAT WITH PELVIS 2-3 VIEWS LEFT; Future   Syed Asad A. [facility] 06/30/2016 3:18 PM

## 2016-07-02 ENCOUNTER — Other Ambulatory Visit: Payer: Self-pay

## 2016-07-02 DIAGNOSIS — J301 Allergic rhinitis due to pollen: Secondary | ICD-10-CM

## 2016-07-02 NOTE — Telephone Encounter (Signed)
Got a fax from [facility] requesting a refill of this patient's Flonase.  Refill request was sent to Dr. Okey Dupre A. Manuella Ghazi for approval and submission.

## 2016-07-07 ENCOUNTER — Ambulatory Visit: Admission: RE | Admit: 2016-07-07 | Payer: BLUE CROSS/BLUE SHIELD | Source: Ambulatory Visit

## 2016-07-13 ENCOUNTER — Other Ambulatory Visit: Payer: Self-pay | Admitting: Family Medicine

## 2016-07-13 DIAGNOSIS — E119 Type 2 diabetes mellitus without complications: Secondary | ICD-10-CM

## 2016-08-04 ENCOUNTER — Ambulatory Visit: Payer: Self-pay | Admitting: Family Medicine

## 2016-08-26 ENCOUNTER — Other Ambulatory Visit: Payer: Self-pay | Admitting: *Deleted

## 2016-08-26 DIAGNOSIS — D509 Iron deficiency anemia, unspecified: Secondary | ICD-10-CM

## 2016-08-30 ENCOUNTER — Inpatient Hospital Stay: Payer: BLUE CROSS/BLUE SHIELD

## 2016-08-31 ENCOUNTER — Inpatient Hospital Stay: Payer: BLUE CROSS/BLUE SHIELD | Admitting: Internal Medicine

## 2016-08-31 ENCOUNTER — Inpatient Hospital Stay: Payer: BLUE CROSS/BLUE SHIELD

## 2016-08-31 ENCOUNTER — Other Ambulatory Visit: Payer: BLUE CROSS/BLUE SHIELD

## 2016-09-01 ENCOUNTER — Ambulatory Visit: Payer: BLUE CROSS/BLUE SHIELD

## 2016-09-03 ENCOUNTER — Inpatient Hospital Stay: Payer: BLUE CROSS/BLUE SHIELD | Attending: Internal Medicine

## 2016-09-03 DIAGNOSIS — D509 Iron deficiency anemia, unspecified: Secondary | ICD-10-CM | POA: Insufficient documentation

## 2016-09-03 LAB — CBC WITH DIFFERENTIAL/PLATELET
BASOS ABS: 0.1 10*3/uL (ref 0–0.1)
Basophils Relative: 1 %
Eosinophils Absolute: 0.2 10*3/uL (ref 0–0.7)
Eosinophils Relative: 3 %
HEMATOCRIT: 41.7 % (ref 35.0–47.0)
Hemoglobin: 13.9 g/dL (ref 12.0–16.0)
LYMPHS PCT: 35 %
Lymphs Abs: 3 10*3/uL (ref 1.0–3.6)
MCH: 29.5 pg (ref 26.0–34.0)
MCHC: 33.3 g/dL (ref 32.0–36.0)
MCV: 88.5 fL (ref 80.0–100.0)
MONO ABS: 0.6 10*3/uL (ref 0.2–0.9)
Monocytes Relative: 7 %
NEUTROS ABS: 4.7 10*3/uL (ref 1.4–6.5)
Neutrophils Relative %: 54 %
Platelets: 289 10*3/uL (ref 150–440)
RBC: 4.71 MIL/uL (ref 3.80–5.20)
RDW: 14.3 % (ref 11.5–14.5)
WBC: 8.7 10*3/uL (ref 3.6–11.0)

## 2016-09-03 LAB — IRON AND TIBC
Iron: 53 ug/dL (ref 28–170)
Saturation Ratios: 17 % (ref 10.4–31.8)
TIBC: 307 ug/dL (ref 250–450)
UIBC: 254 ug/dL

## 2016-09-03 LAB — FERRITIN: Ferritin: 43 ng/mL (ref 11–307)

## 2016-09-06 ENCOUNTER — Other Ambulatory Visit: Payer: Self-pay | Admitting: *Deleted

## 2016-09-06 ENCOUNTER — Encounter: Payer: Self-pay | Admitting: *Deleted

## 2016-09-06 DIAGNOSIS — D509 Iron deficiency anemia, unspecified: Secondary | ICD-10-CM

## 2016-09-20 ENCOUNTER — Other Ambulatory Visit: Payer: Self-pay | Admitting: Family Medicine

## 2016-09-20 DIAGNOSIS — J301 Allergic rhinitis due to pollen: Secondary | ICD-10-CM

## 2016-10-21 ENCOUNTER — Telehealth: Payer: Self-pay | Admitting: Family Medicine

## 2016-10-21 ENCOUNTER — Ambulatory Visit: Payer: Self-pay | Admitting: Family Medicine

## 2016-10-21 NOTE — Telephone Encounter (Signed)
Left voicemail asking patient to call and reschedule the appointment she cancelled this morning for a medication refill please

## 2016-10-21 NOTE — Telephone Encounter (Signed)
Pt has stress eczema and is asking for a refill on Triamcinolone cream. Asking that you [facility] [facility] [facility] in [place]. States her hands are really messed up and is asking that you please fill this today

## 2016-10-22 ENCOUNTER — Telehealth: Payer: Self-pay | Admitting: Family Medicine

## 2016-10-22 DIAGNOSIS — L309 Dermatitis, unspecified: Secondary | ICD-10-CM

## 2016-10-22 MED ORDER — MOMETASONE FUROATE 0.1 % EX OINT: TOPICAL_OINTMENT | Freq: Every day | CUTANEOUS | 0 refills | Status: DC

## 2016-10-22 NOTE — Telephone Encounter (Signed)
Patient is desperately needing refill on either the mometasone (Elocon) or Triamcinolone Cream due break out rash on hands.  Patient was told to make an appointment and she did for Tues. 10/26/16 and then it was canceled due to provider being out of office.  Patient has rescheduled see Dr. Manuella Ghazi on Wed. 10/27/16.  RX needs to be sent to the [facility] on [address] in [place].

## 2016-10-22 NOTE — Telephone Encounter (Signed)
Dr. Manuella Ghazi, Patient is desperately needing refill on either the mometasone (Elocon) or Triamcinolone Cream due break out rash on hands.  Patient was told to make an appointment and she did for Tues. 10/26/16 and then it was canceled due to provider being out of office.  Patient has rescheduled see Dr. Manuella Ghazi on Wed. 10/27/16.  RX needs to be sent to the [facility] on [address] in [place].

## 2016-10-22 NOTE — Telephone Encounter (Signed)
I tried to contact patient and she is not available. I have prescribed Elocon cream for eczema before. We'll send a prescription for Elocon 0.1% cream to apply once daily to her pharmacy.

## 2016-10-25 ENCOUNTER — Telehealth: Payer: Self-pay | Admitting: Family Medicine

## 2016-10-25 NOTE — Telephone Encounter (Signed)
Pt has moved to [place] (due to job). She has an appointment for this coming Wednesday, she is out of metformin and would like to know if you could call in enough to last until she comes in to be seen. [facility] to [facility] [address] in [place]

## 2016-10-26 ENCOUNTER — Ambulatory Visit: Payer: Self-pay | Admitting: Family Medicine

## 2016-10-27 ENCOUNTER — Ambulatory Visit (INDEPENDENT_AMBULATORY_CARE_PROVIDER_SITE_OTHER): Payer: BLUE CROSS/BLUE SHIELD | Admitting: Family Medicine

## 2016-10-27 ENCOUNTER — Encounter: Payer: Self-pay | Admitting: Family Medicine

## 2016-10-27 VITALS — BP 138/70 | HR 90 | Temp 97.5°F | Resp 14 | Wt 175.1 lb

## 2016-10-27 DIAGNOSIS — L309 Dermatitis, unspecified: Secondary | ICD-10-CM

## 2016-10-27 DIAGNOSIS — E119 Type 2 diabetes mellitus without complications: Secondary | ICD-10-CM

## 2016-10-27 MED ORDER — TRIAMCINOLONE ACETONIDE 0.5 % EX OINT: 1.0000 "application " | TOPICAL_OINTMENT | Freq: Two times a day (BID) | CUTANEOUS | 0 refills | Status: DC

## 2016-10-27 MED ORDER — METFORMIN HCL 1000 MG PO TABS: ORAL_TABLET | ORAL | 0 refills | Status: DC

## 2016-10-27 NOTE — Telephone Encounter (Signed)
Prescription for metformin 1000 mg one-half tablet twice daily has been sent to patient's pharmacy

## 2016-10-27 NOTE — Progress Notes (Signed)
Name: Brooke Liu   MRN: [id number]    DOB: December 12, 1950   Date:10/27/2016       Progress Note  Subjective  Chief Complaint  Chief Complaint  Patient presents with  . Medication Refill  . Referral    endocrin    Rash  This is a recurrent problem. The affected locations include the left hand and right hand. The rash is characterized by dryness, peeling and redness. Pertinent negatives include no fatigue. Past treatments include topical steroids.  Diabetes  She presents for her follow-up diabetic visit. She has type 2 diabetes mellitus. Her disease course has been stable. Associated symptoms include foot paresthesias. Pertinent negatives for diabetes include no fatigue, no polydipsia and no polyuria. Current diabetic treatment includes oral agent (monotherapy) (has not been taking Metformin  for 2 days). She is following a diabetic and generally healthy diet. She monitors blood glucose at home 1-2 x per day. Her breakfast blood glucose range is generally 130-140 mg/dl.     Past Medical History:  Diagnosis Date  . Anemia   . Anxiety   . 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 Depression   . Diabetes mellitus, type II ([facility])   . Headache    stress/sinus  . Hemorrhoids   . Motion sickness   . Multiple thyroid nodules   . Sleep apnea    past hx.  resolved with wt loss  . Vertigo    sinus related    Past Surgical History:  Procedure Laterality Date  . ABDOMINAL HYSTERECTOMY    . BACK SURGERY  2010   lumbar  . CESAREAN SECTION    . CHOLECYSTECTOMY    . COLONOSCOPY WITH PROPOFOL N/A 11/21/2015   Procedure: COLONOSCOPY WITH PROPOFOL;  Surgeon: Lucilla Lame, MD;  Location: [facility];  Service: Endoscopy;  Laterality: N/A;  . ESOPHAGOGASTRODUODENOSCOPY (EGD) WITH PROPOFOL N/A 11/21/2015   Procedure: ESOPHAGOGASTRODUODENOSCOPY (EGD) WITH PROPOFOL;  Surgeon: Lucilla Lame, MD;  Location: [facility];  Service: Endoscopy;  Laterality: N/A;  Diabetic - oral meds  . GASTRIC  BYPASS  2000  . HERNIA REPAIR    . SMALL BOWEL REPAIR      Family History  Problem Relation Age of Onset  . Depression Mother   . Diabetes Mother   . Heart disease Mother   . Heart disease Father   . Diabetes Sister   . Diabetes Brother   . Dementia Brother   . Heart disease Brother   . Diabetes Sister   . Fibromyalgia Sister   . Diabetes Brother   . Heart disease Brother   . Diabetes Brother   . Heart disease Brother   . Leukemia Brother   . Cancer Brother   . Liver disease Brother   . Alcohol abuse Brother   . Throat cancer Brother   . Heart disease Brother   . Diabetes Brother     Social History   Social History  . Marital status: Widowed    Spouse name: N/A  . Number of children: N/A  . Years of education: N/A   Occupational History  . Not on file.   Social History Main Topics  . Smoking status: Former Smoker    Years: 15.00    Types: Cigarettes    Quit date: 09/24/1977  . Smokeless tobacco: Never Used  . Alcohol use No  . Drug use: No  . Sexual activity: No   Other Topics Concern  . Not on file  Social History Narrative  . No narrative on file     Current Outpatient Prescriptions:  .  acetaminophen (TYLENOL) 500 MG tablet, Take 500 mg by mouth every 6 (six) hours as needed for mild pain, moderate pain or headache., Disp: , Rfl:  .  aspirin EC 81 MG tablet, Take 81 mg by mouth daily., Disp: , Rfl:  .  desloratadine (CLARINEX) 5 MG tablet, Take 1 tablet (5 mg total) by mouth daily., Disp: 30 tablet, Rfl: 5 .  escitalopram (LEXAPRO) 10 MG tablet, Take 1 tablet (10 mg total) by mouth daily., Disp: 90 tablet, Rfl: 1 .  fluticasone (FLONASE) 50 MCG/ACT nasal spray, Place 2 sprays into both nostrils daily., Disp: 16 g, Rfl: 2 .  IRON PO, Take 325 mg by mouth daily., Disp: , Rfl:  .  metFORMIN (GLUCOPHAGE) 1000 MG tablet, TAKE 1/2 TABLETS (500 MG TOTAL) BY MOUTH 2 (TWO) TIMES DAILY WITH A MEAL., Disp: 90 tablet, Rfl: 0 .  mometasone (ELOCON) 0.1 %  ointment, Apply topically daily., Disp: 45 g, Rfl: 0 .  Multiple Vitamin (MULTI-VITAMINS) TABS, Take by mouth., Disp: , Rfl:  .  azithromycin (ZITHROMAX) 250 MG tablet, 2 tabs po day 1,then 1 tab po q day x 4 days (Patient not taking: Reported on 10/27/2016), Disp: 6 tablet, Rfl: 0 .  triamcinolone cream (KENALOG) 0.1 %, Apply 1 application topically 2 (two) times daily. (Patient not taking: Reported on 10/27/2016), Disp: 30 g, Rfl: 0  Allergies  Allergen Reactions  . Oxycodone-Acetaminophen Hives    Very itchy underneath the skin.  Marland Kitchen Percocet [Oxycodone-Acetaminophen] Hives    Very itchy underneath the skin.  . Iodinated Diagnostic Agents Itching    MRI Dye     Review of Systems  Constitutional: Negative for fatigue.  Skin: Positive for rash.  Endo/Heme/Allergies: Negative for polydipsia.     Objective  Vitals:   10/27/16 1633  BP: 138/70  Pulse: 90  Resp: 14  Temp: (!) 97.5 F (36.4 C)  TempSrc: Oral  SpO2: 99%  Weight: 175 lb 1.6 oz (79.4 kg)    Physical Exam  Constitutional: She is well-developed, well-nourished, and in no distress.  Cardiovascular: Normal rate, regular rhythm, S1 normal, S2 normal and normal heart sounds.   Pulmonary/Chest: Effort normal and breath sounds normal. She has no wheezes.  Skin: Rash noted. Rash is maculopapular.  maculo-papular pruritic dry rash on both palms  Nursing note and vitals reviewed.      Assessment & Plan  1. Eczema of both hands Heart on higher potency steroid ointment for relief of eczema, may need referral to dermatology - triamcinolone ointment (KENALOG) 0.5 %; Apply 1 application topically 2 (two) times daily.  Dispense: 30 g; Refill: 0  2. Type 2 diabetes mellitus without complication, without long-term current use of insulin (HCC) She'll like to be referred to an endocrinologist in [place], will place referral and continue on metformin - Ambulatory referral to Endocrinology - metFORMIN (GLUCOPHAGE) 1000 MG  tablet; TAKE 1/2 TABLETS (500 MG TOTAL) BY MOUTH 2 (TWO) TIMES DAILY WITH A MEAL.  Dispense: 90 tablet; Refill: 0  Brooke Liu A. [facility] 10/27/2016 4:43 PM

## 2016-10-28 ENCOUNTER — Telehealth: Payer: Self-pay | Admitting: Family Medicine

## 2016-10-28 NOTE — Telephone Encounter (Signed)
RECEIVED A CALL A NURSE REQUEST SAYING THAT PT WAS SEEN ON 10-27-16 AND THAT HER METFORMIN HAD NOT BEEN CALLED IN AND THAT HER SUGAR WAS 180. LOOKED AT THE PATIENTS CHART AND SEEN WHERE HER METFORMIN WAS E PRESCRIBED ON 10-27-16 AT 7:05 PM.

## 2016-11-12 ENCOUNTER — Other Ambulatory Visit: Payer: Self-pay | Admitting: Family Medicine

## 2016-11-12 DIAGNOSIS — E119 Type 2 diabetes mellitus without complications: Secondary | ICD-10-CM

## 2016-11-29 ENCOUNTER — Encounter: Payer: Self-pay | Admitting: Internal Medicine

## 2016-12-02 ENCOUNTER — Inpatient Hospital Stay: Payer: BLUE CROSS/BLUE SHIELD | Attending: Internal Medicine

## 2016-12-02 ENCOUNTER — Other Ambulatory Visit: Payer: Self-pay | Admitting: Family Medicine

## 2016-12-02 ENCOUNTER — Emergency Department: Payer: BLUE CROSS/BLUE SHIELD

## 2016-12-02 ENCOUNTER — Other Ambulatory Visit: Payer: Self-pay

## 2016-12-02 ENCOUNTER — Encounter: Payer: Self-pay | Admitting: Emergency Medicine

## 2016-12-02 ENCOUNTER — Inpatient Hospital Stay: Payer: BLUE CROSS/BLUE SHIELD

## 2016-12-02 ENCOUNTER — Emergency Department
Admission: EM | Admit: 2016-12-02 | Discharge: 2016-12-02 | Disposition: A | Discharge status: Home / Self Care | Payer: BLUE CROSS/BLUE SHIELD | Attending: Emergency Medicine | Admitting: Emergency Medicine

## 2016-12-02 ENCOUNTER — Encounter: Payer: Self-pay | Admitting: Family Medicine

## 2016-12-02 ENCOUNTER — Ambulatory Visit (INDEPENDENT_AMBULATORY_CARE_PROVIDER_SITE_OTHER): Payer: BLUE CROSS/BLUE SHIELD | Admitting: Family Medicine

## 2016-12-02 VITALS — BP 136/72 | HR 65 | Temp 98.0°F | Resp 16 | Ht 62.0 in | Wt 176.0 lb

## 2016-12-02 DIAGNOSIS — Z87891 Personal history of nicotine dependence: Secondary | ICD-10-CM | POA: Insufficient documentation

## 2016-12-02 DIAGNOSIS — R5383 Other fatigue: Secondary | ICD-10-CM | POA: Diagnosis not present

## 2016-12-02 DIAGNOSIS — K2960 Other gastritis without bleeding: Secondary | ICD-10-CM | POA: Diagnosis not present

## 2016-12-02 DIAGNOSIS — R079 Chest pain, unspecified: Secondary | ICD-10-CM | POA: Diagnosis present

## 2016-12-02 DIAGNOSIS — Z7982 Long term (current) use of aspirin: Secondary | ICD-10-CM | POA: Diagnosis not present

## 2016-12-02 DIAGNOSIS — Z79899 Other long term (current) drug therapy: Secondary | ICD-10-CM | POA: Diagnosis not present

## 2016-12-02 DIAGNOSIS — F329 Major depressive disorder, single episode, unspecified: Secondary | ICD-10-CM

## 2016-12-02 DIAGNOSIS — Z7984 Long term (current) use of oral hypoglycemic drugs: Secondary | ICD-10-CM | POA: Diagnosis not present

## 2016-12-02 DIAGNOSIS — J301 Allergic rhinitis due to pollen: Secondary | ICD-10-CM

## 2016-12-02 DIAGNOSIS — E119 Type 2 diabetes mellitus without complications: Secondary | ICD-10-CM

## 2016-12-02 DIAGNOSIS — F419 Anxiety disorder, unspecified: Principal | ICD-10-CM

## 2016-12-02 DIAGNOSIS — R1013 Epigastric pain: Secondary | ICD-10-CM | POA: Diagnosis not present

## 2016-12-02 DIAGNOSIS — D509 Iron deficiency anemia, unspecified: Secondary | ICD-10-CM

## 2016-12-02 LAB — TROPONIN I
Troponin I: <0.03 ng/mL (ref <0.03)
Troponin I: <0.03 ng/mL (ref <0.03)

## 2016-12-02 LAB — HEPATIC FUNCTION PANEL
ALK PHOS: 78 U/L (ref 38–126)
ALT: 18 U/L (ref 14–54)
AST: 22 U/L (ref 15–41)
Albumin: 4 g/dL (ref 3.5–5.0)
Bilirubin, Direct: <0.1 mg/dL — ABNORMAL LOW (ref 0.1–0.5)
Total Bilirubin: 0.6 mg/dL (ref 0.3–1.2)
Total Protein: 7.4 g/dL (ref 6.5–8.1)

## 2016-12-02 LAB — CBC
HCT: 41.4 % (ref 35.0–47.0)
Hemoglobin: 14.3 g/dL (ref 12.0–16.0)
MCH: 30.3 pg (ref 26.0–34.0)
MCHC: 34.6 g/dL (ref 32.0–36.0)
MCV: 87.5 fL (ref 80.0–100.0)
PLATELETS: 259 10*3/uL (ref 150–440)
RBC: 4.73 MIL/uL (ref 3.80–5.20)
RDW: 13.5 % (ref 11.5–14.5)
WBC: 8.1 10*3/uL (ref 3.6–11.0)

## 2016-12-02 LAB — BASIC METABOLIC PANEL
Anion gap: 9 (ref 5–15)
BUN: 9 mg/dL (ref 6–20)
CALCIUM: 9 mg/dL (ref 8.9–10.3)
CO2: 26 mmol/L (ref 22–32)
CREATININE: 0.62 mg/dL (ref 0.44–1.00)
Chloride: 101 mmol/L (ref 101–111)
GFR calc non Af Amer: >60 mL/min (ref >60)
Glucose, Bld: 106 mg/dL — ABNORMAL HIGH (ref 65–99)
Potassium: 4.4 mmol/L (ref 3.5–5.1)
Sodium: 136 mmol/L (ref 135–145)

## 2016-12-02 LAB — CBC WITH DIFFERENTIAL/PLATELET
BASOS ABS: 0 10*3/uL (ref 0–0.1)
Basophils Relative: 1 %
Eosinophils Absolute: 0.2 10*3/uL (ref 0–0.7)
Eosinophils Relative: 3 %
HEMATOCRIT: 43.5 % (ref 35.0–47.0)
Hemoglobin: 14.6 g/dL (ref 12.0–16.0)
LYMPHS ABS: 1.7 10*3/uL (ref 1.0–3.6)
LYMPHS PCT: 26 %
MCH: 29.5 pg (ref 26.0–34.0)
MCHC: 33.6 g/dL (ref 32.0–36.0)
MCV: 87.8 fL (ref 80.0–100.0)
MONO ABS: 0.6 10*3/uL (ref 0.2–0.9)
MONOS PCT: 9 %
NEUTROS ABS: 4.1 10*3/uL (ref 1.4–6.5)
Neutrophils Relative %: 61 %
Platelets: 253 10*3/uL (ref 150–440)
RBC: 4.96 MIL/uL (ref 3.80–5.20)
RDW: 13.8 % (ref 11.5–14.5)
WBC: 6.6 10*3/uL (ref 3.6–11.0)

## 2016-12-02 LAB — IRON AND TIBC
Iron: 62 ug/dL (ref 28–170)
Saturation Ratios: 21 % (ref 10.4–31.8)
TIBC: 297 ug/dL (ref 250–450)
UIBC: 235 ug/dL

## 2016-12-02 LAB — FERRITIN: Ferritin: 23 ng/mL (ref 11–307)

## 2016-12-02 LAB — LIPASE, BLOOD: LIPASE: 25 U/L (ref 11–51)

## 2016-12-02 MED ORDER — DESLORATADINE 5 MG PO TABS: 5.0000 mg | ORAL_TABLET | Freq: Every day | ORAL | 0 refills | Status: DC

## 2016-12-02 MED ORDER — GI COCKTAIL ~~LOC~~
ORAL | Status: AC
  Filled 2016-12-02: qty 30

## 2016-12-02 MED ORDER — GI COCKTAIL ~~LOC~~
30.0000 mL | Freq: Once | ORAL | Status: AC
  Administered 2016-12-02: 30 mL via ORAL

## 2016-12-02 MED ORDER — METFORMIN HCL 1000 MG PO TABS: ORAL_TABLET | ORAL | 0 refills | Status: DC

## 2016-12-02 MED ORDER — FAMOTIDINE 20 MG PO TABS: 20.0000 mg | ORAL_TABLET | Freq: Two times a day (BID) | ORAL | 1 refills | Status: DC

## 2016-12-02 MED ORDER — ESCITALOPRAM OXALATE 10 MG PO TABS: 10.0000 mg | ORAL_TABLET | Freq: Every day | ORAL | 1 refills | Status: DC

## 2016-12-02 NOTE — ED Notes (Signed)
AAOx3.  Skin warm and dry.  NAD 

## 2016-12-02 NOTE — ED Triage Notes (Signed)
Arrives via [facility] with C/O Chest pain, onset of symptoms today at 1030.  Sent from PCP-- Pain substernal, radiating to mid back.  Deneis C/O N/V, dizziness.  States has had similar symptoms in the past, symptoms resolved on their own.  ASA 325 mg given at PCP PTA.  CBG  145.  Saline lock, 20 ga LAC -- PTA

## 2016-12-02 NOTE — ED Provider Notes (Addendum)
[facility]  ____________________________________________   I have reviewed the triage vital signs and the nursing notes.   HISTORY  Chief Complaint Chest Pain    HPI Brooke Liu is a 66 y.o. female with a history of non-insulin-dependent diabetes, gastric bypass, hiatal hernia, acid indigestion, hysterectomy, anemia, states that she has had diarrhea for the last few days. That is getting better but it has been associated with nausea. Since her surgery however she does not have the capacity to vomit. She states she has had some mild nausea. She states today she was at rest and suffered epigastric abdominal pain that radiated up towards her chest. She states that she has had for 5 episodes of this since her gastric bypass surgery. This is part of why she had her endoscopy she states and it was found that she only had a hiatal hernia. Patient is also had a stress test in the last few years for these occurrences of pain. She's had these 5 or 6 times. They happen at rest. In between these episodes of sharp burning discomfort, patient is fine. She does not have any exertional symptoms she has no leg swellings or personal or family history of PE or DVT she denies any recent travel she is not on supplemental estrogen. The patient states that she is pain-free at this time. Usually she is "rides it out" but she elected to come see help this time. Patient states that the pain sometimes radiates up towards her jaw. She denies any such radiation today. The pain is not necessarily food induced. When I ask her where it hurt the most she indicated her epigastric region. She has had no melena no bright red blood per rectum no hematemesis. She denies any exertional symptoms as noted above but she states she's been somewhat exhausted recently. 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 The patient states today has had occasional cramps her legs  when she sleeps, but no DVT signs. sometimes the pain radiates towards her back as well. She has no melena or bright red blood per rectum. patient often does take Pepcid for these pains.   Past Medical History:  Diagnosis Date  . Anemia   . Anxiety   . 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 Depression   . Diabetes mellitus, type II ([place])   . Headache    stress/sinus  . Hemorrhoids   . Motion sickness   . Multiple thyroid nodules   . Sleep apnea    past hx.  resolved with wt loss  . Vertigo    sinus related    Patient Active Problem List   Diagnosis Date Noted  . Head injury 06/30/2016  . White matter abnormality on MRI of brain 05/04/2016  . Pharyngitis 04/13/2016  . Iron deficiency anemia   . Bariatric surgery status   . Fatigue 10/03/2015  . History of gastric bypass 10/03/2015  . Multiple thyroid nodules 10/02/2014  . Dyslipidemia 10/02/2014  . Anemia 09/25/2014  . Anxiety and depression 09/25/2014  . Carotid artery narrowing 09/25/2014  . Sex counseling 09/25/2014  . Skin lesion 09/25/2014  . Eczema of both hands 09/25/2014  . Screening for gout 09/25/2014  . Abscess, dental 09/25/2014  . Flu vaccine need 09/25/2014  . Benign hypertension 09/25/2014  . Gravida 2 para 2 09/25/2014  . Encounter for general adult medical examination without abnormal findings 09/25/2014  . Acute urinary tract infection 09/25/2014  .  Thyroid nodule 09/25/2014  . Infection of urinary tract 09/25/2014  . Allergic rhinitis, seasonal 09/25/2014  . Diabetes mellitus, type 2 ([place]) 09/05/2014  . Multinodular goiter 01/14/2014  . Personal history of surgery to heart and great vessels, presenting hazards to health 10/22/2013  . Anxiety 10/15/2013  . Chest discomfort 10/15/2013  . BP (high blood pressure) 10/15/2013  . 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 Difficulty in walking 09/25/2013  . Loss of feeling or sensation 09/25/2013  . Cephalalgia 09/25/2013  . Cervical pain 09/25/2013  . Tingling  09/25/2013    Past Surgical History:  Procedure Laterality Date  . ABDOMINAL HYSTERECTOMY    . BACK SURGERY  2010   lumbar  . CESAREAN SECTION    . CHOLECYSTECTOMY    . COLONOSCOPY WITH PROPOFOL N/A 11/21/2015   Procedure: COLONOSCOPY WITH PROPOFOL;  Surgeon: Lucilla Lame, MD;  Location: [place];  Service: Endoscopy;  Laterality: N/A;  . ESOPHAGOGASTRODUODENOSCOPY (EGD) WITH PROPOFOL N/A 11/21/2015   Procedure: ESOPHAGOGASTRODUODENOSCOPY (EGD) WITH PROPOFOL;  Surgeon: Lucilla Lame, MD;  Location: [place];  Service: Endoscopy;  Laterality: N/A;  Diabetic - oral meds  . GASTRIC BYPASS  2000  . HERNIA REPAIR    . SMALL BOWEL REPAIR      Prior to Admission medications   Medication Sig Start Date End Date Taking? Authorizing Provider  acetaminophen (TYLENOL) 500 MG tablet Take 500 mg by mouth every 6 (six) hours as needed for mild pain, moderate pain or headache.    [provider]  aspirin EC 81 MG tablet Take 81 mg by mouth daily.    [provider]  desloratadine (CLARINEX) 5 MG tablet Take 1 tablet (5 mg total) by mouth daily. 12/02/16   Roselee Nova, MD  escitalopram (LEXAPRO) 10 MG tablet Take 1 tablet (10 mg total) by mouth daily. 12/02/16   Roselee Nova, MD  fluticasone (FLONASE) 50 MCG/ACT nasal spray Place 2 sprays into both nostrils daily. 05/04/16   Roselee Nova, MD  IRON PO Take 325 mg by mouth daily.    [provider]  metFORMIN (GLUCOPHAGE) 1000 MG tablet TAKE 1/2 TABLETS (500 MG TOTAL) BY MOUTH 2 (TWO) TIMES DAILY WITH A MEAL. 12/02/16   Roselee Nova, MD  mometasone (ELOCON) 0.1 % ointment Apply topically daily. 10/22/16   Roselee Nova, MD  Multiple Vitamin (MULTI-VITAMINS) TABS Take by mouth.    [provider]  triamcinolone ointment (KENALOG) 0.5 % Apply 1 application topically 2 (two) times daily. 10/27/16   Roselee Nova, MD    Allergies Oxycodone-acetaminophen; Percocet  [oxycodone-acetaminophen]; and Iodinated diagnostic agents  Family History  Problem Relation Age of Onset  . Depression Mother   . Diabetes Mother   . Heart disease Mother   . Heart disease Father   . Diabetes Sister   . Diabetes Brother   . Dementia Brother   . Heart disease Brother   . Diabetes Sister   . Fibromyalgia Sister   . Diabetes Brother   . Heart disease Brother   . Diabetes Brother   . Heart disease Brother   . Leukemia Brother   . Cancer Brother   . Liver disease Brother   . Alcohol abuse Brother   . Throat cancer Brother   . Heart disease Brother   . Diabetes Brother     Social History Social History  Substance Use Topics  . Smoking status: Former Smoker  Years: 15.00    Types: Cigarettes    Quit date: 09/24/1977  . Smokeless tobacco: Never Used  . Alcohol use No    Review of Systems Constitutional: No fever/chills Eyes: No visual changes. ENT: No sore throat. No stiff neck no neck pain Cardiovascular: see history of present illness Respiratory: Denies shortness of breath. Gastrointestinal:   no vomiting.  positive loose stools, no frankdiarrhea.  No constipation. Genitourinary: Negative for dysuria. Musculoskeletal: Negative lower extremity swelling Skin: Negative for rash. Neurological: Negative for severe headaches, focal weakness or numbness.   ____________________________________________   PHYSICAL EXAM:  VITAL SIGNS: ED Triage Vitals  Enc Vitals Group     BP 12/02/16 1223 (!) 157/76     Pulse Rate 12/02/16 1220 62     Resp 12/02/16 1220 18     Temp 12/02/16 1220 98.4 F (36.9 C)     Temp Source 12/02/16 1220 Oral     SpO2 12/02/16 1220 100 %     Weight 12/02/16 1221 176 lb (79.8 kg)     Height 12/02/16 1221 5\' 2"  (1.575 m)     Head Circumference --      Peak Flow --      Pain Score 12/02/16 1221 2     Pain Loc --      Pain Edu? --      Excl. in [place]? --     Constitutional: Alert and oriented. Well appearing and in no acute  distress.anxious Eyes: Conjunctivae are normal Head: Atraumatic HEENT: No congestion/rhinnorhea. Mucous membranes are moist.  Oropharynx non-erythematous Neck:   Nontender with no meningismus, no masses, no stridor Cardiovascular: Normal rate, regular rhythm. Grossly normal heart sounds.  Good peripheral circulation. Respiratory: Normal respiratory effort.  No retractions. Lungs CTAB. Abdominal: Soft and positive epigastric abdominal discomfort which reproduces her pain.. No distention. No guarding no reboundnonsurgical abdomen Back:  There is no focal tenderness or step off.  there is no midline tenderness there are no lesions noted. there is no CVA tenderness Musculoskeletal: No lower extremity tenderness, no upper extremity tenderness. No joint effusions, no DVT signs strong distal pulses no edema Neurologic:  Normal speech and language. No gross focal neurologic deficits are appreciated.  Skin:  Skin is warm, dry and intact. No rash noted. Psychiatric: Mood and affect are normal. Speech and behavior are normal.  ____________________________________________   LABS (all labs ordered are listed, but only abnormal results are displayed)  Labs Reviewed  CBC  BASIC METABOLIC PANEL  TROPONIN I  HEPATIC FUNCTION PANEL  LIPASE, BLOOD    Pertinent labs  results that were available during my care of the patient were reviewed by me and considered in my medical decision making (see chart for details). ____________________________________________  EKG  I personally interpreted any EKGs ordered by me or triage sinus rhythm rate 59 bpm, borderline bradycardia, normal axis, no acute ST elevation or depression, no acute ischemic changes ____________________________________________  RADIOLOGY  Pertinent labs & imaging results that were available during my care of the patient were reviewed by me and considered in my medical decision making (see chart for details). If possible, patient and/or  family made aware of any abnormal findings. ____________________________________________    PROCEDURES  Procedure(s) performed: None  Procedures  Critical Care performed: None  ____________________________________________   INITIAL IMPRESSION / ASSESSMENT AND PLAN / ED COURSE  Pertinent labs & imaging results that were available during my care of the patient were reviewed by me and considered in my medical decision  making (see chart for details).  patient here with recurrent pain in her epigastric region going up towards her back and towards her throat for the last 5 years at least. She is pain-free at this time unless I deeply palpate into her stomach at which time I can reproduce her symptoms. Patient has had no exertional symptoms. At this time, there does not appear to be clinical evidence to support the diagnosis of pulmonary embolus, dissection, myocarditis, endocarditis, pericarditis, pericardial tamponade, acute coronary syndrome, pneumothorax, pneumonia, or any other acute intrathoracic pathology that will require admission or acute intervention. Nor is there evidence of any significant intra-abdominal pathology causing this discomfort.Considering the patient's symptoms, medical history, and physical examination today, I have low suspicion for r biliary pathology, pancreatitis, perforation or bowel obstruction, hernia, intra-abdominal abscess, AAA or dissection, volvulus or intussusception, mesenteric ischemia, ischemic gut, pyelonephritis or appendicitis.this is most consistent with reflux disease however we will check liver function tests, perform serial abdominal exams, and perform serial cardiac enzymes and chest x-ray and reassess.    ____________________________________________   FINAL CLINICAL IMPRESSION(S) / ED DIAGNOSES  Final diagnoses:  None      This chart was dictated using voice recognition software.  Despite best efforts to proofread,  errors can occur which  can change meaning.      Schuyler Amor, MD 12/02/16 1256    Schuyler Amor, MD 12/02/16 1257

## 2016-12-02 NOTE — Progress Notes (Signed)
Name: Brooke Liu   MRN: [id number]    DOB: 12-13-1950   Date:12/02/2016       Progress Note  Subjective  Chief Complaint  Chief Complaint  Patient presents with  . Diabetes    Pt do not want poc a1c/ glucose. pt rather have lab work done and speak you Dr. Manuella Ghazi.   . Medication Refill    All meds  . Labs Only    Pt would like some labs done regarding her fatigue and diabetes  . Nausea    for the last two days    Diabetes  She presents for her follow-up diabetic visit. She has type 2 diabetes mellitus. Her disease course has been stable. There are no hypoglycemic associated symptoms. Pertinent negatives for hypoglycemia include no dizziness or headaches. Associated symptoms include fatigue and foot paresthesias. Pertinent negatives for diabetes include no polydipsia and no polyuria. Pertinent negatives for diabetic complications include no CVA, heart disease or peripheral neuropathy. Current diabetic treatment includes oral agent (monotherapy). She is following a generally healthy diet. She monitors blood glucose at home 1-2 x per day. Her breakfast blood glucose range is generally 130-140 mg/dl. An ACE inhibitor/angiotensin II receptor blocker is not being taken.  Depression         This is a recurrent problem.  The onset quality is gradual. The problem is unchanged.  Associated symptoms include fatigue and decreased interest.  Associated symptoms include no helplessness, no hopelessness, no headaches and not sad.  Past treatments include SSRIs - Selective serotonin reuptake inhibitors.  Compliance with treatment is good.  Patient has seasonal allergies and takes Clarinex 1 tablet daily, this keeps her from having a sinus infections. She denies any runny nose but has chills, and headaches.      Past Medical History:  Diagnosis Date  . Anemia   . Anxiety   . 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 Depression   . Diabetes mellitus, type II ([place])   . Headache    stress/sinus  .  Hemorrhoids   . Motion sickness   . Multiple thyroid nodules   . Sleep apnea    past hx.  resolved with wt loss  . Vertigo    sinus related    Past Surgical History:  Procedure Laterality Date  . ABDOMINAL HYSTERECTOMY    . BACK SURGERY  2010   lumbar  . CESAREAN SECTION    . CHOLECYSTECTOMY    . COLONOSCOPY WITH PROPOFOL N/A 11/21/2015   Procedure: COLONOSCOPY WITH PROPOFOL;  Surgeon: Lucilla Lame, MD;  Location: [place];  Service: Endoscopy;  Laterality: N/A;  . ESOPHAGOGASTRODUODENOSCOPY (EGD) WITH PROPOFOL N/A 11/21/2015   Procedure: ESOPHAGOGASTRODUODENOSCOPY (EGD) WITH PROPOFOL;  Surgeon: Lucilla Lame, MD;  Location: [place];  Service: Endoscopy;  Laterality: N/A;  Diabetic - oral meds  . GASTRIC BYPASS  2000  . HERNIA REPAIR    . SMALL BOWEL REPAIR      Family History  Problem Relation Age of Onset  . Depression Mother   . Diabetes Mother   . Heart disease Mother   . Heart disease Father   . Diabetes Sister   . Diabetes Brother   . Dementia Brother   . Heart disease Brother   . Diabetes Sister   . Fibromyalgia Sister   . Diabetes Brother   . Heart disease Brother   . Diabetes Brother   . Heart disease Brother   . Leukemia Brother   .  Cancer Brother   . Liver disease Brother   . Alcohol abuse Brother   . Throat cancer Brother   . Heart disease Brother   . Diabetes Brother     Social History   Social History  . Marital status: Widowed    Spouse name: N/A  . Number of children: N/A  . Years of education: N/A   Occupational History  . Not on file.   Social History Main Topics  . Smoking status: Former Smoker    Years: 15.00    Types: Cigarettes    Quit date: 09/24/1977  . Smokeless tobacco: Never Used  . Alcohol use No  . Drug use: No  . Sexual activity: No   Other Topics Concern  . Not on file   Social History Narrative  . No narrative on file     Current Outpatient Prescriptions:  .  acetaminophen (TYLENOL) 500 MG  tablet, Take 500 mg by mouth every 6 (six) hours as needed for mild pain, moderate pain or headache., Disp: , Rfl:  .  aspirin EC 81 MG tablet, Take 81 mg by mouth daily., Disp: , Rfl:  .  desloratadine (CLARINEX) 5 MG tablet, Take 1 tablet (5 mg total) by mouth daily., Disp: 30 tablet, Rfl: 5 .  escitalopram (LEXAPRO) 10 MG tablet, Take 1 tablet (10 mg total) by mouth daily., Disp: 90 tablet, Rfl: 1 .  fluticasone (FLONASE) 50 MCG/ACT nasal spray, Place 2 sprays into both nostrils daily., Disp: 16 g, Rfl: 2 .  IRON PO, Take 325 mg by mouth daily., Disp: , Rfl:  .  metFORMIN (GLUCOPHAGE) 1000 MG tablet, TAKE 1/2 TABLETS (500 MG TOTAL) BY MOUTH 2 (TWO) TIMES DAILY WITH A MEAL., Disp: 90 tablet, Rfl: 0 .  mometasone (ELOCON) 0.1 % ointment, Apply topically daily., Disp: 45 g, Rfl: 0 .  Multiple Vitamin (MULTI-VITAMINS) TABS, Take by mouth., Disp: , Rfl:  .  triamcinolone ointment (KENALOG) 0.5 %, Apply 1 application topically 2 (two) times daily., Disp: 30 g, Rfl: 0 .  azithromycin (ZITHROMAX) 250 MG tablet, 2 tabs po day 1,then 1 tab po q day x 4 days (Patient not taking: Reported on 10/27/2016), Disp: 6 tablet, Rfl: 0  Allergies  Allergen Reactions  . Oxycodone-Acetaminophen Hives    Very itchy underneath the skin.  Marland Kitchen Percocet [Oxycodone-Acetaminophen] Hives    Very itchy underneath the skin.  . Iodinated Diagnostic Agents Itching    MRI Dye     Review of Systems  Constitutional: Positive for fatigue.  Neurological: Negative for dizziness and headaches.  Endo/Heme/Allergies: Negative for polydipsia.  Psychiatric/Behavioral: Positive for depression.      Objective  Vitals:   12/02/16 0950  BP: 136/72  Pulse: 65  Resp: 16  Temp: 98 F (36.7 C)  TempSrc: Oral  SpO2: 97%  Weight: 176 lb (79.8 kg)  Height: 5\' 2"  (1.575 m)    Physical Exam  Constitutional: She is oriented to person, place, and time and well-developed, well-nourished, and in no distress.  HENT:  Head:  Normocephalic and atraumatic.  Right Ear: Tympanic membrane and ear canal normal. No drainage or swelling.  Left Ear: Tympanic membrane and ear canal normal. No drainage or swelling.  Nose: Right sinus exhibits no maxillary sinus tenderness and no frontal sinus tenderness. Left sinus exhibits no maxillary sinus tenderness and no frontal sinus tenderness.  Mouth/Throat: Posterior oropharyngeal erythema present.  Nasal mucosal inflammation  Cardiovascular: Normal rate, regular rhythm and normal heart sounds.   No  murmur heard. Pulmonary/Chest: Effort normal and breath sounds normal. She has no wheezes.  Abdominal: Soft. Bowel sounds are normal. There is no tenderness.  Neurological: She is alert and oriented to person, place, and time.  Psychiatric: Mood, memory, affect and judgment normal.  Nursing note and vitals reviewed.    Recent Results (from the past 2160 hour(s))  CBC with Differential     Status: None   Collection Time: 09/03/16  3:56 PM  Result Value Ref Range   WBC 8.7 3.6 - 11.0 K/uL   RBC 4.71 3.80 - 5.20 MIL/uL   Hemoglobin 13.9 12.0 - 16.0 g/dL   HCT 41.7 35.0 - 47.0 %   MCV 88.5 80.0 - 100.0 fL   MCH 29.5 26.0 - 34.0 pg   MCHC 33.3 32.0 - 36.0 g/dL   RDW 14.3 11.5 - 14.5 %   Platelets 289 150 - 440 K/uL   Neutrophils Relative % 54 %   Neutro Abs 4.7 1.4 - 6.5 K/uL   Lymphocytes Relative 35 %   Lymphs Abs 3.0 1.0 - 3.6 K/uL   Monocytes Relative 7 %   Monocytes Absolute 0.6 0.2 - 0.9 K/uL   Eosinophils Relative 3 %   Eosinophils Absolute 0.2 0 - 0.7 K/uL   Basophils Relative 1 %   Basophils Absolute 0.1 0 - 0.1 K/uL  Ferritin     Status: None   Collection Time: 09/03/16  3:56 PM  Result Value Ref Range   Ferritin 43 11 - 307 ng/mL  Iron and TIBC     Status: None   Collection Time: 09/03/16  3:56 PM  Result Value Ref Range   Iron 53 28 - 170 ug/dL   TIBC 307 250 - 450 ug/dL   Saturation Ratios 17 10.4 - 31.8 %   UIBC 254 ug/dL  CBC with Differential      Status: None   Collection Time: 12/02/16  8:13 AM  Result Value Ref Range   WBC 6.6 3.6 - 11.0 K/uL   RBC 4.96 3.80 - 5.20 MIL/uL   Hemoglobin 14.6 12.0 - 16.0 g/dL   HCT 43.5 35.0 - 47.0 %   MCV 87.8 80.0 - 100.0 fL   MCH 29.5 26.0 - 34.0 pg   MCHC 33.6 32.0 - 36.0 g/dL   RDW 13.8 11.5 - 14.5 %   Platelets 253 150 - 440 K/uL   Neutrophils Relative % 61 %   Neutro Abs 4.1 1.4 - 6.5 K/uL   Lymphocytes Relative 26 %   Lymphs Abs 1.7 1.0 - 3.6 K/uL   Monocytes Relative 9 %   Monocytes Absolute 0.6 0.2 - 0.9 K/uL   Eosinophils Relative 3 %   Eosinophils Absolute 0.2 0 - 0.7 K/uL   Basophils Relative 1 %   Basophils Absolute 0.0 0 - 0.1 K/uL     Assessment & Plan  1. Anxiety and depression Stable, continue on Lexapro - escitalopram (LEXAPRO) 10 MG tablet; Take 1 tablet (10 mg total) by mouth daily.  Dispense: 90 tablet; Refill: 1  2. Type 2 diabetes mellitus without complication, without long-term current use of insulin (HCC) Obtain hemoglobin A1c, urine microalbumin and lipid panel. Tinea on metformin - metFORMIN (GLUCOPHAGE) 1000 MG tablet; TAKE 1/2 TABLETS (500 MG TOTAL) BY MOUTH 2 (TWO) TIMES DAILY WITH A MEAL.  Dispense: 90 tablet; Refill: 0 - HgB A1c - Urine Microalbumin w/creat. ratio - COMPLETE METABOLIC PANEL WITH GFR - Lipid panel  3. Fatigue, unspecified type She does feel  fatigued and would like all her vitamins levels checked - TSH - VITAMIN D 25 Hydroxy (Vit-D Deficiency, Fractures) - B12 and Folate Panel  4. Seasonal allergic rhinitis due to pollen  - desloratadine (CLARINEX) 5 MG tablet; Take 1 tablet (5 mg total) by mouth daily.  Dispense: 90 tablet; Refill: 0  5. Chest pain, unspecified type Patient complained of sudden onset epigastric and chest pain as she was checking out from our office. She was brought back into the room and evaluated, because of the concern for acute coronary syndrome, EMS was called and she was given 325 mg aspirin, she was  evaluated by the EMS and was taken to the emergency room.  Rejeana Fadness [facility] 12/02/2016 10:14 AM

## 2016-12-03 LAB — LIPID PANEL
CHOL/HDL RATIO: 3.6 (calc) (ref <5.0)
CHOLESTEROL: 211 mg/dL — AB (ref <200)
HDL: 58 mg/dL (ref >50)
LDL CHOLESTEROL (CALC): 128 mg/dL — AB
Non-HDL Cholesterol (Calc): 153 mg/dL (calc) — ABNORMAL HIGH (ref <130)
Triglycerides: 134 mg/dL (ref <150)

## 2016-12-03 LAB — COMPLETE METABOLIC PANEL WITH GFR
AG Ratio: 1.4 (calc) (ref 1.0–2.5)
ALBUMIN MSPROF: 4.1 g/dL (ref 3.6–5.1)
ALKALINE PHOSPHATASE (APISO): 93 U/L (ref 33–130)
ALT: 16 U/L (ref 6–29)
AST: 18 U/L (ref 10–35)
BILIRUBIN TOTAL: 0.4 mg/dL (ref 0.2–1.2)
BUN: 9 mg/dL (ref 7–25)
CHLORIDE: 103 mmol/L (ref 98–110)
CO2: 25 mmol/L (ref 20–32)
CREATININE: 0.63 mg/dL (ref 0.50–0.99)
Calcium: 9.3 mg/dL (ref 8.6–10.4)
GFR, Est African American: 110 mL/min/{1.73_m2} (ref >=60)
GFR, Est Non African American: 95 mL/min/{1.73_m2} (ref >=60)
GLUCOSE: 98 mg/dL (ref 65–99)
Globulin: 3 g/dL (calc) (ref 1.9–3.7)
Potassium: 4.8 mmol/L (ref 3.5–5.3)
Sodium: 139 mmol/L (ref 135–146)
Total Protein: 7.1 g/dL (ref 6.1–8.1)

## 2016-12-03 LAB — MICROALBUMIN / CREATININE URINE RATIO
Creatinine, Urine: 55 mg/dL (ref 20–275)
MICROALB UR: 0.3 mg/dL
Microalb Creat Ratio: 5 mcg/mg creat (ref <30)

## 2016-12-03 LAB — HEMOGLOBIN A1C
Hgb A1c MFr Bld: 5.6 % of total Hgb (ref <5.7)
Mean Plasma Glucose: 114 (calc)
eAG (mmol/L): 6.3 (calc)

## 2016-12-03 LAB — B12 AND FOLATE PANEL
FOLATE: 8.5 ng/mL
VITAMIN B 12: 167 pg/mL — AB (ref 200–1100)

## 2016-12-03 LAB — VITAMIN D 25 HYDROXY (VIT D DEFICIENCY, FRACTURES): Vit D, 25-Hydroxy: 12 ng/mL — ABNORMAL LOW (ref 30–100)

## 2016-12-03 LAB — TSH: TSH: 0.71 mIU/L (ref 0.40–4.50)

## 2016-12-07 ENCOUNTER — Ambulatory Visit (INDEPENDENT_AMBULATORY_CARE_PROVIDER_SITE_OTHER): Payer: BLUE CROSS/BLUE SHIELD | Admitting: Gastroenterology

## 2016-12-07 ENCOUNTER — Encounter: Payer: Self-pay | Admitting: Gastroenterology

## 2016-12-07 VITALS — BP 128/73 | HR 80 | Temp 97.8°F | Ht 62.0 in | Wt 177.0 lb

## 2016-12-07 DIAGNOSIS — R0789 Other chest pain: Secondary | ICD-10-CM | POA: Diagnosis not present

## 2016-12-07 NOTE — Progress Notes (Signed)
Primary Care Physician: Roselee Nova, MD  Primary Gastroenterologist:  Dr. Lucilla Lame  Chief Complaint  Patient presents with  . Follow up ER  . Gastroesophageal Reflux    HPI: Brooke Liu is a 66 y.o. female here for follow-up of chest pain.  The patient was in her doctor's office and started to have sudden onset of epigastric and chest pain.  The patient had EMS called and she was taken to the emergency room.  The patient reports that her cardiac workup was negative.  The patient states that she had a GI cocktail and felt better.  She also reports that she has postprandial discomfort.  The patient states that her discomfort is in the epigastric area and goes all the way to her back.  The patient had an upper endoscopy in the past which was negative except for her gastric bypass surgery.  Current Outpatient Prescriptions  Medication Sig Dispense Refill  . acetaminophen (TYLENOL) 500 MG tablet Take 500 mg by mouth every 6 (six) hours as needed for mild pain, moderate pain or headache.    Marland Kitchen aspirin EC 81 MG tablet Take 81 mg by mouth daily.    Marland Kitchen desloratadine (CLARINEX) 5 MG tablet Take 1 tablet (5 mg total) by mouth daily. 90 tablet 0  . escitalopram (LEXAPRO) 10 MG tablet Take 1 tablet (10 mg total) by mouth daily. 90 tablet 1  . escitalopram (LEXAPRO) 10 MG tablet TAKE 1 TABLET (10 MG TOTAL) BY MOUTH DAILY. 90 tablet 1  . famotidine (PEPCID) 20 MG tablet Take 1 tablet (20 mg total) by mouth 2 (two) times daily. 60 tablet 1  . fluticasone (FLONASE) 50 MCG/ACT nasal spray Place 2 sprays into both nostrils daily. 16 g 2  . IRON PO Take 325 mg by mouth daily.    . metFORMIN (GLUCOPHAGE) 1000 MG tablet TAKE 1/2 TABLETS (500 MG TOTAL) BY MOUTH 2 (TWO) TIMES DAILY WITH A MEAL. 90 tablet 0  . metFORMIN (GLUCOPHAGE) 1000 MG tablet TAKE 1/2 A TABLET BY MOUTH TWO TIMES DAILY WITH A MEAL 90 tablet 0  . mometasone (ELOCON) 0.1 % ointment Apply topically daily. 45 g 0  . Multiple  Vitamin (MULTI-VITAMINS) TABS Take by mouth.    . triamcinolone ointment (KENALOG) 0.5 % Apply 1 application topically 2 (two) times daily. 30 g 0   No current facility-administered medications for this visit.     Allergies as of 12/07/2016 - Review Complete 12/07/2016  Allergen Reaction Noted  . Oxycodone-acetaminophen Hives 09/16/2015  . Percocet [oxycodone-acetaminophen] Hives 02/14/2015  . Iodinated diagnostic agents Itching 09/25/2014    ROS:  General: Negative for anorexia, weight loss, fever, chills, fatigue, weakness. ENT: Negative for hoarseness, difficulty swallowing , nasal congestion. CV: Negative for chest pain, angina, palpitations, dyspnea on exertion, peripheral edema.  Respiratory: Negative for dyspnea at rest, dyspnea on exertion, cough, sputum, wheezing.  GI: See history of present illness. GU:  Negative for dysuria, hematuria, urinary incontinence, urinary frequency, nocturnal urination.  Endo: Negative for unusual weight change.    Physical Examination:   BP 128/73   Pulse 80   Temp 97.8 F (36.6 C) (Oral)   Ht 5\' 2"  (1.575 m)   Wt 177 lb (80.3 kg)   BMI 32.37 kg/m   General: Well-nourished, well-developed in no acute distress.  Eyes: No icterus. Conjunctivae pink. Mouth: Oropharyngeal mucosa moist and pink , no lesions erythema or exudate. Lungs: Clear to auscultation bilaterally. Non-labored. Heart: Regular rate and  rhythm, no murmurs rubs or gallops.  Abdomen: Bowel sounds are normal, nontender, nondistended, no hepatosplenomegaly or masses, no abdominal bruits or hernia , no rebound or guarding.   Extremities: No lower extremity edema. No clubbing or deformities. Neuro: Alert and oriented x 3.  Grossly intact. Skin: Warm and dry, no jaundice.   Psych: Alert and cooperative, normal mood and affect.  Labs:    Imaging Studies: Dg Chest 2 View  Result Date: 12/02/2016 CLINICAL DATA:  Chest pain EXAM: CHEST  2 VIEW COMPARISON:  None. FINDINGS: The  lungs are clear without focal pneumonia, edema, pneumothorax or pleural effusion. The cardiopericardial silhouette is within normal limits for size. The visualized bony structures of the thorax are intact. IMPRESSION: No active cardiopulmonary disease. Electronically Signed   By: Misty Stanley M.D.   On: 12/02/2016 13:23    Assessment and Plan:   Brooke Liu is a 66 y.o. y/o female Who comes for epigastric and chest pain that radiates to her back.  The patient states that she takes Tums and a H2 blocker and continues to have the symptoms daily.  She also reports that the GI cocktail made her feel better in the emergency room.  The patient will be started on a trial of Dexilant and stop her H2 blocker.  The patient has been told that if that does not improve her symptoms that she should undergo esophageal manometry for possible distal esophageal spasms.  The patient has been explained the plan and has been told to call me in a week's time if her symptoms are not better on Dexilant.    Lucilla Lame, MD. Marval Regal   Note: This dictation was prepared with Dragon dictation along with smaller phrase technology. Any transcriptional errors that result from this process are unintentional.

## 2016-12-07 NOTE — Patient Instructions (Signed)
You will be scheduled for an Esophageal Manometry. This test checks the function of your esophagus. We are looking at how the esophagus is working and to see if it is having spasms.   You have been given samples of Dexilant 60mg  today. Please take daily as directed by Dr. Allen Norris. If this medication works well for you, please give the office a call and request a prescription to be sent to your pharmacy.

## 2016-12-08 ENCOUNTER — Other Ambulatory Visit: Payer: Self-pay

## 2016-12-08 DIAGNOSIS — K224 Dyskinesia of esophagus: Secondary | ICD-10-CM

## 2016-12-09 ENCOUNTER — Other Ambulatory Visit: Payer: Self-pay

## 2016-12-09 DIAGNOSIS — K224 Dyskinesia of esophagus: Secondary | ICD-10-CM

## 2016-12-10 ENCOUNTER — Encounter: Payer: Self-pay | Admitting: Family Medicine

## 2016-12-10 ENCOUNTER — Ambulatory Visit (INDEPENDENT_AMBULATORY_CARE_PROVIDER_SITE_OTHER): Payer: BLUE CROSS/BLUE SHIELD | Admitting: Family Medicine

## 2016-12-10 VITALS — BP 130/78 | HR 79 | Ht 62.0 in | Wt 177.9 lb

## 2016-12-10 DIAGNOSIS — E538 Deficiency of other specified B group vitamins: Secondary | ICD-10-CM

## 2016-12-10 DIAGNOSIS — E559 Vitamin D deficiency, unspecified: Secondary | ICD-10-CM

## 2016-12-10 DIAGNOSIS — Z23 Encounter for immunization: Secondary | ICD-10-CM

## 2016-12-10 DIAGNOSIS — E7800 Pure hypercholesterolemia, unspecified: Secondary | ICD-10-CM

## 2016-12-10 MED ORDER — ERGOCALCIFEROL 1.25 MG (50000 UT) PO CAPS: 50000.0000 [IU] | ORAL_CAPSULE | ORAL | 0 refills | Status: DC

## 2016-12-10 MED ORDER — CYANOCOBALAMIN 1000 MCG/ML IJ SOLN
1000.0000 ug | Freq: Once | INTRAMUSCULAR | Status: AC
  Administered 2016-12-10: 1000 ug via INTRAMUSCULAR

## 2016-12-10 MED ORDER — ROSUVASTATIN CALCIUM 5 MG PO TABS: 5.0000 mg | ORAL_TABLET | Freq: Every day | ORAL | 0 refills | Status: DC

## 2016-12-10 NOTE — Progress Notes (Signed)
Name: Brooke Liu   MRN: [id number]    DOB: June 29, 1950   Date:12/10/2016       Progress Note  Subjective  Chief Complaint  Chief Complaint  Patient presents with  . Fatigue  . Follow-up    ER F/U   . Immunizations    Declines flu vaccine     HPI  Pt. Presents for ER follow up, she was sent over to the ER from our office for complaints of epigastric and chest pain, dizziness and fatigue. She was evaluated and two sets of troponins were negative, CBC BMP and lipase were normal. She was given a GI cocktail which helped relieve her symptoms. It was felt that she had epigastric source of her pain. She has now been scheduled with Gastroenterology, scheduled for esophageal manometry. She had low Vitamin B12 and Vitamin D obtained on routine lab work. She feels tired and fatigued.  In addition, pt. Will be started on statin for hypercholesterolemia (elevated Total cholesterol and LDL), has diabetes mellitus, no history of CVA or MI.   Past Medical History:  Diagnosis Date  . Anemia   . Anxiety   . Cancer ([place])    uterine ca  . Depression   . Diabetes mellitus, type II ([place])   . Headache    stress/sinus  . Hemorrhoids   . Motion sickness   . Multiple thyroid nodules   . Sleep apnea    past hx.  resolved with wt loss  . Vertigo    sinus related    Past Surgical History:  Procedure Laterality Date  . ABDOMINAL HYSTERECTOMY    . BACK SURGERY  2010   lumbar  . CESAREAN SECTION    . CHOLECYSTECTOMY    . COLONOSCOPY WITH PROPOFOL N/A 11/21/2015   Procedure: COLONOSCOPY WITH PROPOFOL;  Surgeon: Lucilla Lame, MD;  Location: [facility];  Service: Endoscopy;  Laterality: N/A;  . ESOPHAGOGASTRODUODENOSCOPY (EGD) WITH PROPOFOL N/A 11/21/2015   Procedure: ESOPHAGOGASTRODUODENOSCOPY (EGD) WITH PROPOFOL;  Surgeon: Lucilla Lame, MD;  Location: [facility];  Service: Endoscopy;  Laterality: N/A;  Diabetic - oral meds  . GASTRIC BYPASS  2000  . HERNIA REPAIR    .  SMALL BOWEL REPAIR      Family History  Problem Relation Age of Onset  . Depression Mother   . Diabetes Mother   . Heart disease Mother   . Heart disease Father   . Diabetes Sister   . Diabetes Brother   . Dementia Brother   . Heart disease Brother   . Diabetes Sister   . Fibromyalgia Sister   . Diabetes Brother   . Heart disease Brother   . Diabetes Brother   . Heart disease Brother   . Leukemia Brother   . Cancer Brother   . Liver disease Brother   . Alcohol abuse Brother   . Throat cancer Brother   . Heart disease Brother   . Diabetes Brother     Social History   Social History  . Marital status: Widowed    Spouse name: N/A  . Number of children: N/A  . Years of education: N/A   Occupational History  . Not on file.   Social History Main Topics  . Smoking status: Former Smoker    Years: 15.00    Types: Cigarettes    Quit date: 09/24/1977  . Smokeless tobacco: Never Used  . Alcohol use No  . Drug use: No  . Sexual activity: No  Other Topics Concern  . Not on file   Social History Narrative  . No narrative on file     Current Outpatient Prescriptions:  .  acetaminophen (TYLENOL) 500 MG tablet, Take 500 mg by mouth every 6 (six) hours as needed for mild pain, moderate pain or headache., Disp: , Rfl:  .  aspirin EC 81 MG tablet, Take 81 mg by mouth daily., Disp: , Rfl:  .  desloratadine (CLARINEX) 5 MG tablet, Take 1 tablet (5 mg total) by mouth daily., Disp: 90 tablet, Rfl: 0 .  escitalopram (LEXAPRO) 10 MG tablet, TAKE 1 TABLET (10 MG TOTAL) BY MOUTH DAILY., Disp: 90 tablet, Rfl: 1 .  famotidine (PEPCID) 20 MG tablet, Take 1 tablet (20 mg total) by mouth 2 (two) times daily., Disp: 60 tablet, Rfl: 1 .  fluticasone (FLONASE) 50 MCG/ACT nasal spray, Place 2 sprays into both nostrils daily., Disp: 16 g, Rfl: 2 .  IRON PO, Take 325 mg by mouth daily., Disp: , Rfl:  .  metFORMIN (GLUCOPHAGE) 1000 MG tablet, TAKE 1/2 TABLETS (500 MG TOTAL) BY MOUTH 2 (TWO)  TIMES DAILY WITH A MEAL., Disp: 90 tablet, Rfl: 0 .  metFORMIN (GLUCOPHAGE) 1000 MG tablet, TAKE 1/2 A TABLET BY MOUTH TWO TIMES DAILY WITH A MEAL, Disp: 90 tablet, Rfl: 0 .  mometasone (ELOCON) 0.1 % ointment, Apply topically daily., Disp: 45 g, Rfl: 0 .  Multiple Vitamin (MULTI-VITAMINS) TABS, Take by mouth., Disp: , Rfl:  .  triamcinolone ointment (KENALOG) 0.5 %, Apply 1 application topically 2 (two) times daily., Disp: 30 g, Rfl: 0  Allergies  Allergen Reactions  . Oxycodone-Acetaminophen Hives    Very itchy underneath the skin.  Marland Kitchen Percocet [Oxycodone-Acetaminophen] Hives    Very itchy underneath the skin.  . Iodinated Diagnostic Agents Itching    MRI Dye     ROS  Please see history of present illness for complete discussion of ROS  Objective  Vitals:   12/10/16 0925  BP: 130/78  Pulse: 79  SpO2: 95%  Weight: 177 lb 14.4 oz (80.7 kg)  Height: 5\' 2"  (1.575 m)    Physical Exam  Constitutional: She is oriented to person, place, and time and well-developed, well-nourished, and in no distress.  HENT:  Head: Normocephalic and atraumatic.  Cardiovascular: Normal rate, regular rhythm and normal heart sounds.   No murmur heard. Pulmonary/Chest: Effort normal and breath sounds normal. She has no wheezes.  Abdominal: Soft. Bowel sounds are normal. There is no tenderness.  Musculoskeletal: She exhibits no edema.  Neurological: She is alert and oriented to person, place, and time.  Psychiatric: Mood, memory, affect and judgment normal.  Nursing note and vitals reviewed.     Assessment & Plan  1. Vitamin D deficiency Start on vitamin D 50,000 units once weekly for 12 weeks, recheck levels after completion of treatment - ergocalciferol (VITAMIN D2) 50000 units capsule; Take 1 capsule (50,000 Units total) by mouth once a week.  Dispense: 12 capsule; Refill: 0  2. Vitamin B12 deficiency Parenteral administration of vitamin B12, should improve her energy levels, repeat in  one month - cyanocobalamin ((VITAMIN B-12)) injection 1,000 mcg; Inject 1 mL (1,000 mcg total) into the muscle once.  3. Hypercholesterolemia Start on high intensity statin for hyperlipidemia and primary prevention - rosuvastatin (CRESTOR) 5 MG tablet; Take 1 tablet (5 mg total) by mouth at bedtime.  Dispense: 90 tablet; Refill: 0  4. Flu vaccine need  - Flu Vaccine QUAD 36+ mos IM  Margarit Minshall [facility] 12/10/2016 9:41 AM

## 2016-12-13 ENCOUNTER — Other Ambulatory Visit: Payer: Self-pay

## 2016-12-22 ENCOUNTER — Ambulatory Visit (HOSPITAL_COMMUNITY)
Admission: RE | Admit: 2016-12-22 | Discharge: 2016-12-22 | Disposition: A | Discharge status: Home / Self Care | Payer: BLUE CROSS/BLUE SHIELD | Source: Ambulatory Visit | Attending: Gastroenterology | Admitting: Gastroenterology

## 2016-12-22 ENCOUNTER — Encounter (HOSPITAL_COMMUNITY)
Admission: RE | Disposition: A | Discharge status: Home / Self Care | Payer: Self-pay | Source: Ambulatory Visit | Attending: Gastroenterology

## 2016-12-22 ENCOUNTER — Encounter (HOSPITAL_COMMUNITY): Payer: Self-pay | Admitting: *Deleted

## 2016-12-22 DIAGNOSIS — R0789 Other chest pain: Secondary | ICD-10-CM | POA: Diagnosis not present

## 2016-12-22 HISTORY — PX: ESOPHAGEAL MANOMETRY: SHX5429

## 2016-12-22 SURGERY — MANOMETRY, ESOPHAGUS

## 2016-12-22 MED ORDER — LIDOCAINE VISCOUS 2 % MT SOLN
OROMUCOSAL | Status: AC
  Filled 2016-12-22: qty 15

## 2016-12-22 SURGICAL SUPPLY — 2 items
FACESHIELD LNG OPTICON STERILE (SAFETY) IMPLANT
GLOVE BIO SURGEON STRL SZ8 (GLOVE) ×4 IMPLANT

## 2016-12-22 NOTE — Progress Notes (Signed)
Esophageal manometry done per protocol.  Patient tolerated well. Report to be sent to Dr Allen Norris at [facility].

## 2016-12-23 ENCOUNTER — Encounter (HOSPITAL_COMMUNITY): Payer: Self-pay | Admitting: Gastroenterology

## 2016-12-31 ENCOUNTER — Encounter: Payer: Self-pay | Admitting: Gastroenterology

## 2017-01-04 ENCOUNTER — Other Ambulatory Visit: Payer: Self-pay | Admitting: *Deleted

## 2017-01-04 ENCOUNTER — Inpatient Hospital Stay: Payer: BLUE CROSS/BLUE SHIELD

## 2017-01-04 ENCOUNTER — Encounter: Payer: Self-pay | Admitting: Internal Medicine

## 2017-01-04 ENCOUNTER — Telehealth: Payer: Self-pay | Admitting: Gastroenterology

## 2017-01-04 DIAGNOSIS — D509 Iron deficiency anemia, unspecified: Secondary | ICD-10-CM

## 2017-01-04 NOTE — Telephone Encounter (Signed)
Pt notified of manometry results.

## 2017-01-04 NOTE — Telephone Encounter (Signed)
Patient called wanting to know the results on the test for her esophagus. Please call patient. Had it done in [place].

## 2017-01-07 ENCOUNTER — Other Ambulatory Visit: Payer: BLUE CROSS/BLUE SHIELD

## 2017-01-08 ENCOUNTER — Other Ambulatory Visit: Payer: Self-pay | Admitting: Family Medicine

## 2017-01-08 DIAGNOSIS — J302 Other seasonal allergic rhinitis: Secondary | ICD-10-CM

## 2017-01-10 ENCOUNTER — Ambulatory Visit: Payer: BLUE CROSS/BLUE SHIELD

## 2017-01-11 ENCOUNTER — Other Ambulatory Visit: Payer: BLUE CROSS/BLUE SHIELD

## 2017-01-11 ENCOUNTER — Inpatient Hospital Stay: Payer: BLUE CROSS/BLUE SHIELD | Attending: Internal Medicine

## 2017-01-11 ENCOUNTER — Ambulatory Visit: Payer: BLUE CROSS/BLUE SHIELD

## 2017-01-11 ENCOUNTER — Ambulatory Visit: Payer: BLUE CROSS/BLUE SHIELD | Admitting: Internal Medicine

## 2017-01-18 ENCOUNTER — Inpatient Hospital Stay: Payer: BLUE CROSS/BLUE SHIELD | Admitting: Internal Medicine

## 2017-01-18 ENCOUNTER — Inpatient Hospital Stay: Payer: BLUE CROSS/BLUE SHIELD | Attending: Internal Medicine

## 2017-01-18 ENCOUNTER — Ambulatory Visit: Payer: BLUE CROSS/BLUE SHIELD | Admitting: Internal Medicine

## 2017-01-18 DIAGNOSIS — K909 Intestinal malabsorption, unspecified: Secondary | ICD-10-CM | POA: Insufficient documentation

## 2017-01-18 NOTE — Progress Notes (Deleted)
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     HISTORY OF PRESENTING ILLNESS:  Brooke Liu 66 y.o. female with previous history of gastric bypass is here for follow up, further evaluation, and recommendations for iron deficiency anemia.  Patient admits to continuing mild fatigue, much improved since last IV iron infusion in September. Denies any blood in stools or black stools. Denies any blood in urine. Continues to complain of difficulty in concentration and irritability. Appetite remains good, no weight loss. No nausea or vomiting.   She reports sudden occasional sweating on forehead and underarms for the past few months, of different quality than hot flashes.  They are self-resolving.  ROS: A complete 10 point review of system is done which is negative except mentioned above in history of present illness  MEDICAL HISTORY:  Past Medical History:  Diagnosis Date  . Anemia   . Anxiety   . Cancer ([place])    uterine ca  . Depression   . Diabetes mellitus, type II ([place])   . Headache    stress/sinus  . Hemorrhoids   . Motion sickness   . Multiple thyroid nodules   . Sleep apnea    past hx.  resolved with wt loss  . Vertigo    sinus related    SURGICAL HISTORY: Past Surgical History:  Procedure Laterality Date  . ABDOMINAL HYSTERECTOMY    . BACK SURGERY  2010   lumbar  . CESAREAN SECTION    . CHOLECYSTECTOMY    . GASTRIC BYPASS  2000  . HERNIA REPAIR    . SMALL BOWEL REPAIR      SOCIAL HISTORY: Works as a Surveyor, minerals. No smoking or alcohol. Social History   Socioeconomic History  . Marital status: Widowed    Spouse name: Not on file  . Number  of children: Not on file  . Years of education: Not on file  . Highest education level: Not on file  Social Needs  . Financial resource strain: Not on file  . Food insecurity - worry: Not on file  . Food insecurity - inability: Not on file  . Transportation needs - medical: Not on file  . Transportation needs - non-medical: Not on file  Occupational History  . Not on file  Tobacco Use  . Smoking status: Former Smoker    Years: 15.00    Types: Cigarettes    Last attempt to quit: 09/24/1977    Years since quitting: 39.3  . Smokeless tobacco: Never Used  Substance and Sexual Activity  . Alcohol use: No    Alcohol/week: 0.0 oz  . Drug use: No  . Sexual activity: No  Other Topics Concern  . Not on file  Social History Narrative  . Not on file    FAMILY HISTORY: Family History  Problem Relation Age of Onset  . Depression Mother   . Diabetes Mother   . Heart disease Mother   . Heart disease Father   . Diabetes Sister   . Diabetes Brother   . Dementia Brother   . Heart disease Brother   . Diabetes Sister   . Fibromyalgia Sister   . Diabetes  Brother   . Heart disease Brother   . Diabetes Brother   . Heart disease Brother   . Leukemia Brother   . Cancer Brother   . Liver disease Brother   . Alcohol abuse Brother   . Throat cancer Brother   . Heart disease Brother   . Diabetes Brother     ALLERGIES:  is allergic to oxycodone-acetaminophen; percocet [oxycodone-acetaminophen]; and iodinated diagnostic agents.  MEDICATIONS:  Current Outpatient Medications  Medication Sig Dispense Refill  . acetaminophen (TYLENOL) 500 MG tablet Take 500 mg by mouth every 6 (six) hours as needed for mild pain, moderate pain or headache.    Marland Kitchen aspirin EC 81 MG tablet Take 81 mg by mouth daily.    Marland Kitchen desloratadine (CLARINEX) 5 MG tablet TAKE 1 TABLET EVERY DAY 30 tablet 5  . ergocalciferol (VITAMIN D2) 50000 units capsule Take 1 capsule (50,000 Units total) by mouth once a week. 12 capsule 0   . escitalopram (LEXAPRO) 10 MG tablet TAKE 1 TABLET (10 MG TOTAL) BY MOUTH DAILY. 90 tablet 1  . famotidine (PEPCID) 20 MG tablet Take 1 tablet (20 mg total) by mouth 2 (two) times daily. 60 tablet 1  . fluticasone (FLONASE) 50 MCG/ACT nasal spray Place 2 sprays into both nostrils daily. 16 g 2  . IRON PO Take 325 mg by mouth daily.    . metFORMIN (GLUCOPHAGE) 1000 MG tablet TAKE 1/2 TABLETS (500 MG TOTAL) BY MOUTH 2 (TWO) TIMES DAILY WITH A MEAL. 90 tablet 0  . metFORMIN (GLUCOPHAGE) 1000 MG tablet TAKE 1/2 A TABLET BY MOUTH TWO TIMES DAILY WITH A MEAL 90 tablet 0  . mometasone (ELOCON) 0.1 % ointment Apply topically daily. 45 g 0  . Multiple Vitamin (MULTI-VITAMINS) TABS Take by mouth.    . rosuvastatin (CRESTOR) 5 MG tablet Take 1 tablet (5 mg total) by mouth at bedtime. 90 tablet 0  . triamcinolone ointment (KENALOG) 0.5 % Apply 1 application topically 2 (two) times daily. 30 g 0   No current facility-administered medications for this visit.       Marland Kitchen  PHYSICAL EXAMINATION: ECOG PERFORMANCE STATUS: 1 - Symptomatic but completely ambulatory  There were no vitals filed for this visit. There were no vitals filed for this visit.  GENERAL: Well-nourished well-developed; Alert, no distress and comfortable.   alone EYES: no pallor or icterus OROPHARYNX: no thrush or ulceration; good dentition  NECK: supple, no masses felt LYMPH:  no palpable lymphadenopathy in the cervical, axillary or inguinal regions LUNGS: clear to auscultation and  No wheeze or crackles HEART/CVS: regular rate & rhythm and no murmurs; No lower extremity edema ABDOMEN: abdomen soft, non-tender and normal bowel sounds Musculoskeletal:no cyanosis of digits and no clubbing  PSYCH: alert & oriented x 3 with fluent speech NEURO: no focal motor/sensory deficits SKIN:  no rashes or significant lesions  LABORATORY DATA:  I have reviewed the data as listed Lab Results  Component Value Date   WBC 8.1 12/02/2016   HGB  14.3 12/02/2016   HCT 41.4 12/02/2016   MCV 87.5 12/02/2016   PLT 259 12/02/2016   Recent Labs    12/02/16 1041 12/02/16 1226  NA 139 136  K 4.8 4.4  CL 103 101  CO2 25 26  GLUCOSE 98 106*  BUN 9 9  CREATININE 0.63 0.62  CALCIUM 9.3 9.0  GFRNONAA 95 >60  GFRAA 110 >60  PROT 7.1 7.4  ALBUMIN  --  4.0  AST 18 22  ALT  16 18  ALKPHOS  --  78  BILITOT 0.4 0.6  BILIDIR  --  <0.1*  IBILI  --  NOT CALCULATED    RADIOGRAPHIC STUDIES: No results found.  ASSESSMENT & PLAN:   No problem-specific Assessment & Plan notes found for this encounter.     Cammie Sickle, MD 01/18/2017 8:43 AM

## 2017-01-18 NOTE — Assessment & Plan Note (Deleted)
Sec to gastric bypass- patient is symptomatic. She does not tolerate by mouth iron. Recommend IV Venofer today. Potential infusion reactions were previously discussed with patient.  # recommend follow up labs in 4 months/possible IV. venofer infusion.  & Intermittent sweating: follow up with PCP  & Concentration difficulties/irritability: follow up with PCP for possible referral to neurologist.

## 2017-02-03 ENCOUNTER — Encounter: Payer: Self-pay | Admitting: Internal Medicine

## 2017-03-03 ENCOUNTER — Ambulatory Visit: Payer: Self-pay | Admitting: Family Medicine

## 2017-03-07 ENCOUNTER — Other Ambulatory Visit: Payer: Self-pay

## 2017-03-07 DIAGNOSIS — E7800 Pure hypercholesterolemia, unspecified: Secondary | ICD-10-CM

## 2017-03-16 ENCOUNTER — Encounter: Payer: Self-pay | Admitting: Family Medicine

## 2017-03-16 ENCOUNTER — Ambulatory Visit: Payer: Medicare Other | Admitting: Family Medicine

## 2017-03-16 VITALS — BP 136/74 | HR 72 | Temp 98.1°F | Resp 16 | Ht 62.0 in | Wt 180.3 lb

## 2017-03-16 DIAGNOSIS — Z23 Encounter for immunization: Secondary | ICD-10-CM

## 2017-03-16 DIAGNOSIS — E7800 Pure hypercholesterolemia, unspecified: Secondary | ICD-10-CM

## 2017-03-16 DIAGNOSIS — L309 Dermatitis, unspecified: Secondary | ICD-10-CM | POA: Diagnosis not present

## 2017-03-16 DIAGNOSIS — F3341 Major depressive disorder, recurrent, in partial remission: Secondary | ICD-10-CM | POA: Diagnosis not present

## 2017-03-16 DIAGNOSIS — E559 Vitamin D deficiency, unspecified: Secondary | ICD-10-CM | POA: Diagnosis not present

## 2017-03-16 DIAGNOSIS — E538 Deficiency of other specified B group vitamins: Secondary | ICD-10-CM | POA: Diagnosis not present

## 2017-03-16 DIAGNOSIS — E119 Type 2 diabetes mellitus without complications: Secondary | ICD-10-CM

## 2017-03-16 LAB — GLUCOSE, POCT (MANUAL RESULT ENTRY): POC GLUCOSE: 91 mg/dL (ref 70–99)

## 2017-03-16 LAB — POCT GLYCOSYLATED HEMOGLOBIN (HGB A1C): Hemoglobin A1C: 5.7

## 2017-03-16 MED ORDER — ATORVASTATIN CALCIUM 20 MG PO TABS
20.0000 mg | ORAL_TABLET | Freq: Every day | ORAL | 0 refills | Status: DC
Start: 2017-03-16 — End: 2017-04-25

## 2017-03-16 MED ORDER — TRIAMCINOLONE ACETONIDE 0.5 % EX OINT: 1.0000 "application " | TOPICAL_OINTMENT | Freq: Two times a day (BID) | CUTANEOUS | 0 refills | Status: DC

## 2017-03-16 NOTE — Progress Notes (Signed)
Name: Brooke Liu   MRN: [id number]    DOB: Nov 17, 1950   Date:03/16/2017       Progress Note  Subjective  Chief Complaint  Chief Complaint  Patient presents with  . Labs Only    Vitamin B-12 and Vitamin D was really low last time it was checked and she use to do b-12 injections.   . medication mangement    Would like to be off lexapro     Diabetes  She presents for her follow-up diabetic visit. She has type 2 diabetes mellitus. Her disease course has been stable. Hypoglycemia symptoms include headaches (frequent headaches, worse every AM facial flushing). Pertinent negatives for hypoglycemia include no nervousness/anxiousness or speech difficulty. Associated symptoms include foot paresthesias. Pertinent negatives for diabetes include no fatigue, no polydipsia and no polyuria. Symptoms are stable. Pertinent negatives for diabetic complications include no CVA or heart disease. Current diabetic treatment includes oral agent (monotherapy). She is following a generally healthy diet. She rarely participates in exercise. She monitors blood glucose at home 1-2 x per day. Her breakfast blood glucose range is generally 90-110 mg/dl. Her bedtime blood glucose range is generally 140-180 mg/dl. An ACE inhibitor/angiotensin II receptor blocker is not being taken.  Hyperlipidemia  This is a chronic problem. The problem is uncontrolled. Recent lipid tests were reviewed and are high. Pertinent negatives include no leg pain, myalgias or shortness of breath. Current antihyperlipidemic treatment includes statins. Risk factors for coronary artery disease include diabetes mellitus.  Depression         This is a chronic problem.  The onset quality is gradual.   Associated symptoms include headaches (frequent headaches, worse every AM facial flushing).  Associated symptoms include no fatigue, no helplessness, no hopelessness, no appetite change and no myalgias.  Past treatments include SSRIs - Selective  serotonin reuptake inhibitors.  Compliance with prior treatments: wants to go off Lexapro, her mind feels foggy, difficulty with collecting her thoughts,   And is taking vitamin B12 injections by a facility in [place], she would like to have her levels checked as she believes she feels fatigued, her last vitamin B12 level was below normal in September 2018   Past Medical History:  Diagnosis Date  . Anemia   . Anxiety   . 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 Depression   . Diabetes mellitus, type II ([place])   . Headache    stress/sinus  . Hemorrhoids   . Motion sickness   . Multiple thyroid nodules   . Sleep apnea    past hx.  resolved with wt loss  . Vertigo    sinus related    Past Surgical History:  Procedure Laterality Date  . ABDOMINAL HYSTERECTOMY    . BACK SURGERY  2010   lumbar  . CESAREAN SECTION    . CHOLECYSTECTOMY    . COLONOSCOPY WITH PROPOFOL N/A 11/21/2015   Procedure: COLONOSCOPY WITH PROPOFOL;  Surgeon: Lucilla Lame, MD;  Location: [facility];  Service: Endoscopy;  Laterality: N/A;  . ESOPHAGEAL MANOMETRY N/A 12/22/2016   Procedure: ESOPHAGEAL MANOMETRY (EM);  Surgeon: Mauri Pole, MD;  Location: [facility];  Service: Endoscopy;  Laterality: N/A;  . ESOPHAGOGASTRODUODENOSCOPY (EGD) WITH PROPOFOL N/A 11/21/2015   Procedure: ESOPHAGOGASTRODUODENOSCOPY (EGD) WITH PROPOFOL;  Surgeon: Lucilla Lame, MD;  Location: [facility];  Service: Endoscopy;  Laterality: N/A;  Diabetic - oral meds  . GASTRIC BYPASS  2000  . HERNIA REPAIR    . SMALL BOWEL  REPAIR      Family History  Problem Relation Age of Onset  . Depression Mother   . Diabetes Mother   . Heart disease Mother   . Heart disease Father   . Diabetes Sister   . Diabetes Brother   . Dementia Brother   . Heart disease Brother   . Diabetes Sister   . Fibromyalgia Sister   . Diabetes Brother   . Heart disease Brother   . Diabetes Brother   . Heart disease Brother   . Leukemia Brother   .  Cancer Brother   . Liver disease Brother   . Alcohol abuse Brother   . Throat cancer Brother   . Heart disease Brother   . Diabetes Brother     Social History   Socioeconomic History  . Marital status: Widowed    Spouse name: Not on file  . Number of children: Not on file  . Years of education: Not on file  . Highest education level: Not on file  Social Needs  . Financial resource strain: Not on file  . Food insecurity - worry: Not on file  . Food insecurity - inability: Not on file  . Transportation needs - medical: Not on file  . Transportation needs - non-medical: Not on file  Occupational History  . Not on file  Tobacco Use  . Smoking status: Former Smoker    Years: 15.00    Types: Cigarettes    Last attempt to quit: 09/24/1977    Years since quitting: 39.5  . Smokeless tobacco: Never Used  Substance and Sexual Activity  . Alcohol use: No    Alcohol/week: 0.0 oz  . Drug use: No  . Sexual activity: No  Other Topics Concern  . Not on file  Social History Narrative  . Not on file     Current Outpatient Medications:  .  acetaminophen (TYLENOL) 500 MG tablet, Take 500 mg by mouth every 6 (six) hours as needed for mild pain, moderate pain or headache., Disp: , Rfl:  .  aspirin EC 81 MG tablet, Take 81 mg by mouth daily., Disp: , Rfl:  .  desloratadine (CLARINEX) 5 MG tablet, TAKE 1 TABLET EVERY DAY, Disp: 30 tablet, Rfl: 5 .  ergocalciferol (VITAMIN D2) 50000 units capsule, Take 1 capsule (50,000 Units total) by mouth once a week., Disp: 12 capsule, Rfl: 0 .  famotidine (PEPCID) 20 MG tablet, Take 1 tablet (20 mg total) by mouth 2 (two) times daily., Disp: 60 tablet, Rfl: 1 .  fluticasone (FLONASE) 50 MCG/ACT nasal spray, Place 2 sprays into both nostrils daily., Disp: 16 g, Rfl: 2 .  IRON PO, Take 325 mg by mouth daily., Disp: , Rfl:  .  metFORMIN (GLUCOPHAGE) 1000 MG tablet, TAKE 1/2 TABLETS (500 MG TOTAL) BY MOUTH 2 (TWO) TIMES DAILY WITH A MEAL., Disp: 90 tablet,  Rfl: 0 .  metFORMIN (GLUCOPHAGE) 1000 MG tablet, TAKE 1/2 A TABLET BY MOUTH TWO TIMES DAILY WITH A MEAL, Disp: 90 tablet, Rfl: 0 .  mometasone (ELOCON) 0.1 % ointment, Apply topically daily., Disp: 45 g, Rfl: 0 .  Multiple Vitamin (MULTI-VITAMINS) TABS, Take by mouth., Disp: , Rfl:  .  rosuvastatin (CRESTOR) 5 MG tablet, Take 1 tablet (5 mg total) by mouth at bedtime., Disp: 90 tablet, Rfl: 0 .  triamcinolone ointment (KENALOG) 0.5 %, Apply 1 application topically 2 (two) times daily., Disp: 30 g, Rfl: 0 .  escitalopram (LEXAPRO) 10 MG tablet, TAKE 1 TABLET (10  MG TOTAL) BY MOUTH DAILY. (Patient not taking: Reported on 03/16/2017), Disp: 90 tablet, Rfl: 1  Allergies  Allergen Reactions  . Oxycodone-Acetaminophen Hives    Very itchy underneath the skin.  Marland Kitchen Percocet [Oxycodone-Acetaminophen] Hives    Very itchy underneath the skin.  . Iodinated Diagnostic Agents Itching    MRI Dye     Review of Systems  Constitutional: Negative for appetite change and fatigue.  Respiratory: Negative for shortness of breath.   Musculoskeletal: Negative for myalgias.  Neurological: Positive for headaches (frequent headaches, worse every AM facial flushing). Negative for speech difficulty.  Endo/Heme/Allergies: Negative for polydipsia.  Psychiatric/Behavioral: Positive for depression. The patient is not nervous/anxious.      Objective  Vitals:   03/16/17 1119  BP: 136/74  Pulse: 72  Resp: 16  Temp: 98.1 F (36.7 C)  TempSrc: Oral  SpO2: 96%  Weight: 180 lb 4.8 oz (81.8 kg)  Height: 5\' 2"  (1.575 m)    Physical Exam  Constitutional: She is oriented to person, place, and time and well-developed, well-nourished, and in no distress.  HENT:  Head: Normocephalic and atraumatic.  Cardiovascular: Normal rate, regular rhythm and normal heart sounds.  No murmur heard. Pulmonary/Chest: Effort normal and breath sounds normal. She has no wheezes.  Abdominal: Soft. Bowel sounds are normal. There is no  tenderness.  Musculoskeletal: She exhibits no edema.  Neurological: She is alert and oriented to person, place, and time.  Psychiatric: Mood, memory, affect and judgment normal.  Nursing note and vitals reviewed.   Recent Results (from the past 2160 hour(s))  POCT Glucose (CBG)     Status: Normal   Collection Time: 03/16/17 11:20 AM  Result Value Ref Range   POC Glucose 91 70 - 99 mg/dl     Assessment & Plan  1. Type 2 diabetes mellitus without complication, without long-term current use of insulin (HCC) Hemoglobin A1c is 5.7%, well-controlled diabetes - POCT HgB A1C - POCT Glucose (CBG)  2. Vitamin D deficiency  - VITAMIN D 25 Hydroxy (Vit-D Deficiency, Fractures)  3. Vitamin B12 deficiency  - B12  4. Pure hypercholesterolemia Obtain FLP, changed from rosuvastatin to atorvastatin - Lipid panel - atorvastatin (LIPITOR) 20 MG tablet; Take 1 tablet (20 mg total) by mouth daily.  Dispense: 90 tablet; Refill: 0  5. Eczema of both hands  - triamcinolone ointment (KENALOG) 0.5 %; Apply 1 application topically 2 (two) times daily.  Dispense: 30 g; Refill: 0  6. Recurrent major depressive disorder, in partial remission ([place]) Patient wishes to taper off Cipro, advised to decrease to 5 mg) 2.5 mg and taper off over 2 weeks, she should return to be started on a different agent for depression.  7. Need for 23-polyvalent pneumococcal polysaccharide vaccine  - Pneumococcal polysaccharide vaccine 23-valent greater than or equal to 2yo subcutaneous/IM   [employer] [facility] 03/16/2017 11:43 AM

## 2017-04-25 ENCOUNTER — Other Ambulatory Visit: Payer: Self-pay | Admitting: Family Medicine

## 2017-04-25 DIAGNOSIS — F32A Depression, unspecified: Secondary | ICD-10-CM

## 2017-04-25 DIAGNOSIS — F419 Anxiety disorder, unspecified: Principal | ICD-10-CM

## 2017-04-25 DIAGNOSIS — E119 Type 2 diabetes mellitus without complications: Secondary | ICD-10-CM

## 2017-04-25 DIAGNOSIS — F329 Major depressive disorder, single episode, unspecified: Secondary | ICD-10-CM

## 2017-04-25 DIAGNOSIS — E7800 Pure hypercholesterolemia, unspecified: Secondary | ICD-10-CM

## 2017-04-25 MED ORDER — ESCITALOPRAM OXALATE 10 MG PO TABS: 10.0000 mg | ORAL_TABLET | Freq: Every day | ORAL | 0 refills | Status: DC

## 2017-04-25 MED ORDER — METFORMIN HCL 1000 MG PO TABS: ORAL_TABLET | ORAL | 0 refills | Status: DC

## 2017-04-25 MED ORDER — ATORVASTATIN CALCIUM 20 MG PO TABS: 20.0000 mg | ORAL_TABLET | Freq: Every day | ORAL | 0 refills | Status: DC

## 2017-04-25 NOTE — Telephone Encounter (Signed)
Left a message informing the patient since Dr. Manuella Ghazi is  No longer here her med refill request will go to New assign provider Dr. Ancil Boozer. Any questions she have she will have to speak to tiffany or crystal, Dr.Sowles MA.

## 2017-04-25 NOTE — Telephone Encounter (Signed)
Copied from [place] [id number]. Topic: Quick Communication - Rx Refill/Question >> Apr 25, 2017 11:15 AM Oliver Pila B wrote: Medication: metFORMIN (GLUCOPHAGE) 1000 MG tablet [[id number]] , escitalopram (LEXAPRO) 10 MG tablet [[id number]] , atorvastatin (LIPITOR) 20 MG tablet [[id number]]   PT WOULD LIKE TO BE CONTACTED SINCE Dr. Manuella Ghazi IS NO LONGER THERE  Has the patient contacted their pharmacy? Yes.     (Agent: If no, request that the patient contact the pharmacy for the refill.)   Preferred Pharmacy (with phone number or street name): [facility]   Agent: Please be advised that RX refills may take up to 3 business days. We ask that you follow-up with your pharmacy.

## 2017-04-25 NOTE — Telephone Encounter (Signed)
Patient said that she is completely out of the metformin & is worried she wont have her dose for today. Advised her that it has been sent to Dr Ancil Boozer for review. Patient understood

## 2017-05-20 ENCOUNTER — Ambulatory Visit: Payer: Self-pay | Admitting: Family Medicine

## 2017-07-07 ENCOUNTER — Other Ambulatory Visit: Payer: Self-pay | Admitting: Family Medicine

## 2017-07-07 ENCOUNTER — Telehealth: Payer: Self-pay | Admitting: Family Medicine

## 2017-07-07 DIAGNOSIS — E559 Vitamin D deficiency, unspecified: Secondary | ICD-10-CM

## 2017-07-07 DIAGNOSIS — E7800 Pure hypercholesterolemia, unspecified: Secondary | ICD-10-CM

## 2017-07-07 DIAGNOSIS — E785 Hyperlipidemia, unspecified: Secondary | ICD-10-CM

## 2017-07-07 DIAGNOSIS — D508 Other iron deficiency anemias: Secondary | ICD-10-CM

## 2017-07-07 DIAGNOSIS — E538 Deficiency of other specified B group vitamins: Secondary | ICD-10-CM

## 2017-07-07 DIAGNOSIS — Z79899 Other long term (current) drug therapy: Secondary | ICD-10-CM

## 2017-07-07 NOTE — Telephone Encounter (Signed)
Copied from [place] [phone]. Topic: Quick Communication - See Telephone Encounter >> Jul 07, 2017 11:45 AM Rutherford Nail, NT wrote: CRM for notification. See Telephone encounter for: 07/07/17. Patient would like Iron level check to be added to the lab orders from Dr Manuella Ghazi. States she has been having headaches and nausea. States she has been through this before and know the symptoms. Stated that she had been to the cancer center to get infusions. States that she will be in next Friday (07/15/17) to get labs draw. Please advise.

## 2017-07-11 ENCOUNTER — Other Ambulatory Visit: Payer: Self-pay | Admitting: Family Medicine

## 2017-07-11 DIAGNOSIS — E119 Type 2 diabetes mellitus without complications: Secondary | ICD-10-CM

## 2017-07-11 MED ORDER — METFORMIN HCL 1000 MG PO TABS: ORAL_TABLET | ORAL | 0 refills | Status: DC

## 2017-07-11 NOTE — Telephone Encounter (Signed)
Copied from [place]. Topic: Quick Communication - Rx Refill/Question >> Jul 11, 2017 12:31 PM Oliver Pila B wrote: Pt called to see if she can get the medication refilled or at least a 10 day count until her appt on 5.20.19 b/c she is completely out, call pt to advise  Medication: metFORMIN (GLUCOPHAGE) 1000 MG tablet [[id number]]

## 2017-07-11 NOTE — Telephone Encounter (Signed)
Refill request for diabetic medication:   Metformin 1000 mg  Last office visit pertaining to diabetes: 03/16/2017  Lab Results  Component Value Date   HGBA1C 5.7 03/16/2017    Follow-ups on file. 07/25/2017

## 2017-07-15 ENCOUNTER — Other Ambulatory Visit: Payer: Self-pay

## 2017-07-15 DIAGNOSIS — D508 Other iron deficiency anemias: Secondary | ICD-10-CM

## 2017-07-15 DIAGNOSIS — Z79899 Other long term (current) drug therapy: Secondary | ICD-10-CM

## 2017-07-15 DIAGNOSIS — E785 Hyperlipidemia, unspecified: Secondary | ICD-10-CM

## 2017-07-15 DIAGNOSIS — E538 Deficiency of other specified B group vitamins: Secondary | ICD-10-CM

## 2017-07-15 DIAGNOSIS — E559 Vitamin D deficiency, unspecified: Secondary | ICD-10-CM

## 2017-07-16 LAB — COMPLETE METABOLIC PANEL WITH GFR
AG Ratio: 1.5 (calc) (ref 1.0–2.5)
ALT: 19 U/L (ref 6–29)
AST: 19 U/L (ref 10–35)
Albumin: 4.2 g/dL (ref 3.6–5.1)
Alkaline phosphatase (APISO): 88 U/L (ref 33–130)
BILIRUBIN TOTAL: 0.5 mg/dL (ref 0.2–1.2)
BUN: 9 mg/dL (ref 7–25)
CHLORIDE: 104 mmol/L (ref 98–110)
CO2: 26 mmol/L (ref 20–32)
Calcium: 9.2 mg/dL (ref 8.6–10.4)
Creat: 0.67 mg/dL (ref 0.50–0.99)
GFR, EST AFRICAN AMERICAN: 107 mL/min/{1.73_m2} (ref >=60)
GFR, Est Non African American: 92 mL/min/{1.73_m2} (ref >=60)
GLUCOSE: 108 mg/dL (ref 65–139)
Globulin: 2.8 g/dL (calc) (ref 1.9–3.7)
POTASSIUM: 4.4 mmol/L (ref 3.5–5.3)
Sodium: 139 mmol/L (ref 135–146)
TOTAL PROTEIN: 7 g/dL (ref 6.1–8.1)

## 2017-07-16 LAB — IRON,TIBC AND FERRITIN PANEL
%SAT: 17 % (ref 11–50)
FERRITIN: 23 ng/mL (ref 20–288)
IRON: 55 ug/dL (ref 45–160)
TIBC: 321 ug/dL (ref 250–450)

## 2017-07-16 LAB — VITAMIN B12: VITAMIN B 12: 474 pg/mL (ref 200–1100)

## 2017-07-16 LAB — VITAMIN D 25 HYDROXY (VIT D DEFICIENCY, FRACTURES): VIT D 25 HYDROXY: 12 ng/mL — AB (ref 30–100)

## 2017-07-16 LAB — LIPID PANEL
Cholesterol: 147 mg/dL (ref <200)
HDL: 48 mg/dL — ABNORMAL LOW (ref >50)
LDL Cholesterol (Calc): 76 mg/dL (calc)
NON-HDL CHOLESTEROL (CALC): 99 mg/dL (ref <130)
Total CHOL/HDL Ratio: 3.1 (calc) (ref <5.0)
Triglycerides: 145 mg/dL (ref <150)

## 2017-07-18 ENCOUNTER — Other Ambulatory Visit: Payer: Self-pay | Admitting: Family Medicine

## 2017-07-18 DIAGNOSIS — E559 Vitamin D deficiency, unspecified: Secondary | ICD-10-CM

## 2017-07-18 MED ORDER — ERGOCALCIFEROL 1.25 MG (50000 UT) PO CAPS: 50000.0000 [IU] | ORAL_CAPSULE | ORAL | 0 refills | Status: DC

## 2017-07-18 NOTE — Progress Notes (Unsigned)
vi 

## 2017-07-20 ENCOUNTER — Ambulatory Visit: Payer: Self-pay | Admitting: Family Medicine

## 2017-07-25 ENCOUNTER — Encounter

## 2017-07-25 ENCOUNTER — Encounter: Payer: Self-pay | Admitting: Family Medicine

## 2017-07-25 ENCOUNTER — Ambulatory Visit (INDEPENDENT_AMBULATORY_CARE_PROVIDER_SITE_OTHER): Payer: Self-pay | Admitting: Family Medicine

## 2017-07-25 ENCOUNTER — Ambulatory Visit: Payer: Self-pay | Admitting: Family Medicine

## 2017-07-25 VITALS — BP 136/74 | HR 82 | Temp 98.4°F | Resp 18 | Ht 62.0 in | Wt 189.9 lb

## 2017-07-25 DIAGNOSIS — E1169 Type 2 diabetes mellitus with other specified complication: Secondary | ICD-10-CM

## 2017-07-25 DIAGNOSIS — E7800 Pure hypercholesterolemia, unspecified: Secondary | ICD-10-CM

## 2017-07-25 DIAGNOSIS — Z1231 Encounter for screening mammogram for malignant neoplasm of breast: Secondary | ICD-10-CM

## 2017-07-25 DIAGNOSIS — Z1239 Encounter for other screening for malignant neoplasm of breast: Secondary | ICD-10-CM

## 2017-07-25 DIAGNOSIS — R296 Repeated falls: Secondary | ICD-10-CM

## 2017-07-25 DIAGNOSIS — F3341 Major depressive disorder, recurrent, in partial remission: Secondary | ICD-10-CM

## 2017-07-25 DIAGNOSIS — Z1159 Encounter for screening for other viral diseases: Secondary | ICD-10-CM

## 2017-07-25 DIAGNOSIS — E559 Vitamin D deficiency, unspecified: Secondary | ICD-10-CM

## 2017-07-25 DIAGNOSIS — E538 Deficiency of other specified B group vitamins: Secondary | ICD-10-CM

## 2017-07-25 DIAGNOSIS — E785 Hyperlipidemia, unspecified: Secondary | ICD-10-CM

## 2017-07-25 DIAGNOSIS — J301 Allergic rhinitis due to pollen: Secondary | ICD-10-CM

## 2017-07-25 DIAGNOSIS — Z114 Encounter for screening for human immunodeficiency virus [HIV]: Secondary | ICD-10-CM

## 2017-07-25 DIAGNOSIS — K219 Gastro-esophageal reflux disease without esophagitis: Secondary | ICD-10-CM

## 2017-07-25 DIAGNOSIS — H9193 Unspecified hearing loss, bilateral: Secondary | ICD-10-CM

## 2017-07-25 DIAGNOSIS — R9082 White matter disease, unspecified: Secondary | ICD-10-CM

## 2017-07-25 DIAGNOSIS — D692 Other nonthrombocytopenic purpura: Secondary | ICD-10-CM | POA: Insufficient documentation

## 2017-07-25 DIAGNOSIS — E1140 Type 2 diabetes mellitus with diabetic neuropathy, unspecified: Secondary | ICD-10-CM

## 2017-07-25 DIAGNOSIS — R2689 Other abnormalities of gait and mobility: Secondary | ICD-10-CM

## 2017-07-25 MED ORDER — METFORMIN HCL 1000 MG PO TABS: ORAL_TABLET | ORAL | 0 refills | Status: DC

## 2017-07-25 MED ORDER — ATORVASTATIN CALCIUM 20 MG PO TABS: 20.0000 mg | ORAL_TABLET | Freq: Every day | ORAL | 1 refills | Status: DC

## 2017-07-25 MED ORDER — RANITIDINE HCL 150 MG PO TABS: 150.0000 mg | ORAL_TABLET | Freq: Two times a day (BID) | ORAL | 1 refills | Status: DC | PRN

## 2017-07-25 MED ORDER — DESLORATADINE 5 MG PO TABS: 5.0000 mg | ORAL_TABLET | Freq: Every day | ORAL | 1 refills | Status: DC

## 2017-07-25 MED ORDER — ESCITALOPRAM OXALATE 5 MG PO TABS: 5.0000 mg | ORAL_TABLET | Freq: Every day | ORAL | 0 refills | Status: DC

## 2017-07-25 NOTE — Progress Notes (Signed)
Name: Brooke Liu   MRN: [id number]    DOB: [birth year]   Date:07/25/2017       Progress Note  Subjective  Chief Complaint  Chief Complaint  Patient presents with  . Hypertension  . Diabetes  . Anxiety  . Depression  . Medication Refill    HPI  DMII: she states that had bariatric surgery 2002,weight was 265 lbs at the time, was able to come off medication for a long time, she had to resume metformin about 5 years ago. She denies side effects of medication. She states she likes sweets and is not very compliant with her diet. Glucose is 120's fasting, it can spike to 200 post-prandially . She denies polyphagia, polydipsia but has polyuria. She has numbness on feet and recurrent falls. Feels off balance and occasional dizziness. 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 Durwin Reges and is taking otc medication, eats small portions. We will try higher dose and symptoms persists refer to GI. She is gaining again . Discussed life style modification. She has a history of iron deficiency anemia, B12 and vitamin D but takign supplementation  White matter disease: seen by Dr. Melrose Nakayama in the past, still falling a lot and has to focus to walk, we will refer her back to neurologist    Patient Active Problem List   Diagnosis Date Noted  . Senile purpura ([place]) 07/25/2017  . Recurrent major depressive disorder, in partial remission ([place]) 07/25/2017  . 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 Head injury 06/30/2016  . White matter abnormality on MRI of brain 05/04/2016  . Iron deficiency anemia   . Fatigue 10/03/2015  . History of gastric bypass 10/03/2015  . Dyslipidemia 10/02/2014  . Carotid artery narrowing 09/25/2014  . Eczema of both hands 09/25/2014  . Benign hypertension 09/25/2014  . Allergic rhinitis, seasonal 09/25/2014  . Diabetes  mellitus, type 2 ([place]) 09/05/2014  . Multinodular goiter 01/14/2014  . Dysthymia 10/15/2013  . 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 Difficulty in walking 09/25/2013  . Loss of feeling or sensation 09/25/2013  . Cephalalgia 09/25/2013  . Cervical pain 09/25/2013    Past Surgical History:  Procedure Laterality Date  . ABDOMINAL HYSTERECTOMY    . BACK SURGERY  2010   lumbar  . CESAREAN SECTION    . CHOLECYSTECTOMY    . COLONOSCOPY WITH PROPOFOL N/A 11/21/2015   Procedure: COLONOSCOPY WITH PROPOFOL;  Surgeon: Lucilla Lame, MD;  Location: [facility];  Service: Endoscopy;  Laterality: N/A;  . ESOPHAGEAL MANOMETRY N/A 12/22/2016   Procedure: ESOPHAGEAL MANOMETRY (EM);  Surgeon: Mauri Pole, MD;  Location: [facility];  Service: Endoscopy;  Laterality: N/A;  . ESOPHAGOGASTRODUODENOSCOPY (EGD) WITH PROPOFOL N/A 11/21/2015   Procedure: ESOPHAGOGASTRODUODENOSCOPY (EGD) WITH PROPOFOL;  Surgeon: Lucilla Lame, MD;  Location: [place];  Service: Endoscopy;  Laterality: N/A;  Diabetic - oral meds  . GASTRIC BYPASS  2000  . HERNIA REPAIR    . SMALL BOWEL REPAIR      Family History  Problem Relation Age of Onset  . Depression Mother   . Diabetes Mother   . Heart disease Mother   . Heart disease Father   . Diabetes Sister   . Diabetes Brother   . Dementia Brother   . Heart disease Brother   . Diabetes Sister   . Fibromyalgia  Sister   . Diabetes Brother   . Heart disease Brother   . Diabetes Brother   . Heart disease Brother   . Leukemia Brother   . Cancer Brother   . Liver disease Brother   . Alcohol abuse Brother   . Throat cancer Brother   . Heart disease Brother   . Diabetes Brother     Social History   Socioeconomic History  . Marital status: Widowed    Spouse name: Not on file  . Number of children: Not on file  . Years of education: Not on file  . Highest education level: Not on file  Occupational History  . Not on file  Social Needs  .  Financial resource strain: Not on file  . Food insecurity:    Worry: Not on file    Inability: Not on file  . Transportation needs:    Medical: Not on file    Non-medical: Not on file  Tobacco Use  . Smoking status: Former Smoker    Years: 15.00    Types: Cigarettes    Start date: 03/08/1962    Last attempt to quit: 09/24/1977    Years since quitting: 39.8  . Smokeless tobacco: Never Used  Substance and Sexual Activity  . Alcohol use: No    Alcohol/week: 0.0 oz  . Drug use: No  . Sexual activity: Never  Lifestyle  . Physical activity:    Days per week: Not on file    Minutes per session: Not on file  . Stress: Not on file  Relationships  . Social connections:    Talks on phone: Not on file    Gets together: Not on file    Attends religious service: Not on file    Active member of club or organization: Not on file    Attends meetings of clubs or organizations: Not on file    Relationship status: Not on file  . Intimate partner violence:    Fear of current or ex partner: Not on file    Emotionally abused: Not on file    Physically abused: Not on file    Forced sexual activity: Not on file  Other Topics Concern  . Not on file  Social History Narrative  . Not on file     Current Outpatient Medications:  .  acetaminophen (TYLENOL) 500 MG tablet, Take 500 mg by mouth every 6 (six) hours as needed for mild pain, moderate pain or headache., Disp: , Rfl:  .  aspirin EC 81 MG tablet, Take 81 mg by mouth daily., Disp: , Rfl:  .  atorvastatin (LIPITOR) 20 MG tablet, Take 1 tablet (20 mg total) by mouth daily., Disp: 90 tablet, Rfl: 1 .  desloratadine (CLARINEX) 5 MG tablet, Take 1 tablet (5 mg total) by mouth daily., Disp: 90 tablet, Rfl: 1 .  ergocalciferol (VITAMIN D2) 50000 units capsule, Take 1 capsule (50,000 Units total) by mouth once a week., Disp: 12 capsule, Rfl: 0 .  escitalopram (LEXAPRO) 5 MG tablet, Take 1 tablet (5 mg total) by mouth daily., Disp: 90 tablet, Rfl:  0 .  fluticasone (FLONASE) 50 MCG/ACT nasal spray, Place 2 sprays into both nostrils daily., Disp: 16 g, Rfl: 2 .  IRON PO, Take 325 mg by mouth daily., Disp: , Rfl:  .  metFORMIN (GLUCOPHAGE) 1000 MG tablet, TAKE 1/2 TABLETS (500 MG TOTAL) BY MOUTH 2 (TWO) TIMES DAILY WITH A MEAL., Disp: 90 tablet, Rfl: 0 .  mometasone (ELOCON) 0.1 %  ointment, Apply topically daily., Disp: 45 g, Rfl: 0 .  Multiple Vitamin (MULTI-VITAMINS) TABS, Take by mouth., Disp: , Rfl:  .  triamcinolone ointment (KENALOG) 0.5 %, Apply 1 application topically 2 (two) times daily., Disp: 30 g, Rfl: 0 .  ranitidine (ZANTAC) 150 MG tablet, Take 1 tablet (150 mg total) by mouth 2 (two) times daily as needed for heartburn., Disp: 180 tablet, Rfl: 1  Allergies  Allergen Reactions  . Oxycodone-Acetaminophen Hives    Very itchy underneath the skin.  Marland Kitchen Percocet [Oxycodone-Acetaminophen] Hives    Very itchy underneath the skin.  . Iodinated Diagnostic Agents Itching    MRI Dye     ROS  Constitutional: Negative for fever or weight change.  Respiratory: Negative for cough and shortness of breath.   Cardiovascular: Negative for chest pain or palpitations.  Gastrointestinal: Negative for abdominal pain, no bowel changes.  Musculoskeletal: positive  for gait problem but no  joint swelling.  Skin: easy bruising  Neurological: positive for intermittent  Dizziness but no  headache.  No other specific complaints in a complete review of systems (except as listed in HPI above).  Objective  Vitals:   07/25/17 0914  BP: 136/74  Pulse: 82  Resp: 18  Temp: 98.4 F (36.9 C)  TempSrc: Oral  SpO2: 96%  Weight: 189 lb 14.4 oz (86.1 kg)  Height: 5\' 2"  (1.575 m)    Body mass index is 34.73 kg/m.  Physical Exam  Constitutional: Patient appears well-developed and well-nourished. Obese  No distress.  HEENT: head atraumatic, normocephalic, pupils equal and reactive to light, neck supple, throat within normal  limits Cardiovascular: Normal rate, regular rhythm and normal heart sounds.  No murmur heard. No BLE edema. Pulmonary/Chest: Effort normal and breath sounds normal. No respiratory distress. Skin: senile purpura  Abdominal: Soft.  There is no tenderness. Psychiatric: Patient has a normal mood and affect. behavior is normal. Judgment and thought content normal.  Recent Results (from the past 2160 hour(s))  COMPLETE METABOLIC PANEL WITH GFR     Status: None   Collection Time: 07/15/17 10:12 AM  Result Value Ref Range   Glucose, Bld 108 65 - 139 mg/dL    Comment: .        Non-fasting reference interval .    BUN 9 7 - 25 mg/dL   Creat 0.67 0.50 - 0.99 mg/dL    Comment: For patients >30 years of age, the reference limit for Creatinine is approximately 13% higher for people identified as African-American. .    GFR, Est Non African American 92 > OR = 60 mL/min/1.32m2   GFR, Est African American 107 > OR = 60 mL/min/1.76m2   BUN/Creatinine Ratio NOT APPLICABLE 6 - 22 (calc)   Sodium 139 135 - 146 mmol/L   Potassium 4.4 3.5 - 5.3 mmol/L   Chloride 104 98 - 110 mmol/L   CO2 26 20 - 32 mmol/L   Calcium 9.2 8.6 - 10.4 mg/dL   Total Protein 7.0 6.1 - 8.1 g/dL   Albumin 4.2 3.6 - 5.1 g/dL   Globulin 2.8 1.9 - 3.7 g/dL (calc)   AG Ratio 1.5 1.0 - 2.5 (calc)   Total Bilirubin 0.5 0.2 - 1.2 mg/dL   Alkaline phosphatase (APISO) 88 33 - 130 U/L   AST 19 10 - 35 U/L   ALT 19 6 - 29 U/L  Iron, TIBC and Ferritin Panel     Status: None   Collection Time: 07/15/17 10:12 AM  Result Value Ref  Range   Iron 55 45 - 160 mcg/dL   TIBC 321 250 - 450 mcg/dL (calc)   %SAT 17 11 - 50 % (calc)   Ferritin 23 20 - 288 ng/mL  Lipid panel     Status: Abnormal   Collection Time: 07/15/17 10:12 AM  Result Value Ref Range   Cholesterol 147 <200 mg/dL   HDL 48 (L) >50 mg/dL   Triglycerides 145 <150 mg/dL   LDL Cholesterol (Calc) 76 mg/dL (calc)    Comment: Reference range: <100 . Desirable range <100  mg/dL for primary prevention;   <70 mg/dL for patients with CHD or diabetic patients  with > or = 2 CHD risk factors. Marland Kitchen LDL-C is now calculated using the Martin-Hopkins  calculation, which is a validated novel method providing  better accuracy than the Friedewald equation in the  estimation of LDL-C.  Cresenciano Genre et al. Annamaria Helling. 7341;937(90): 2061-2068  (http://education.[facility].com/faq/FAQ164)    Total CHOL/HDL Ratio 3.1 <5.0 (calc)   Non-HDL Cholesterol (Calc) 99 <130 mg/dL (calc)    Comment: For patients with diabetes plus 1 major ASCVD risk  factor, treating to a non-HDL-C goal of <100 mg/dL  (LDL-C of <70 mg/dL) is considered a therapeutic  option.   VITAMIN D 25 Hydroxy (Vit-D Deficiency, Fractures)     Status: Abnormal   Collection Time: 07/15/17 10:12 AM  Result Value Ref Range   Vit D, 25-Hydroxy 12 (L) 30 - 100 ng/mL    Comment: Vitamin D Status         25-OH Vitamin D: . Deficiency:                    <20 ng/mL Insufficiency:             20 - 29 ng/mL Optimal:                 > or = 30 ng/mL . For 25-OH Vitamin D testing on patients on  D2-supplementation and patients for whom quantitation  of D2 and D3 fractions is required, the QuestAssureD(TM) 25-OH VIT D, (D2,D3), LC/MS/MS is recommended: order  code 412-163-5327 (patients >70yrs). . For more information on this test, go to: http://education.[facility].com/faq/FAQ163 (This link is being provided for  informational/educational purposes only.)   Vitamin B12     Status: None   Collection Time: 07/15/17 10:12 AM  Result Value Ref Range   Vitamin B-12 474 200 - 1,100 pg/mL    Diabetic Foot Exam: Diabetic Foot Exam - Simple   Simple Foot Form Diabetic Foot exam was performed with the following findings:  Yes 07/25/2017 10:27 AM  Visual Inspection No deformities, no ulcerations, no other skin breakdown bilaterally:  Yes Sensation Testing Intact to touch and monofilament testing bilaterally:  Yes Pulse  Check Posterior Tibialis and Dorsalis pulse intact bilaterally:  Yes Comments     PHQ2/9: Depression screen [facility] 2/9 07/25/2017 03/16/2017 12/02/2016 10/27/2016 06/30/2016  Decreased Interest 0 0 0 0 0  Down, Depressed, Hopeless 0 0 0 0 0  PHQ - 2 Score 0 0 0 0 0  Altered sleeping 3 - - - -  Tired, decreased energy 3 - - - -  Change in appetite 0 - - - -  Feeling bad or failure about yourself  0 - - - -  Trouble concentrating 0 - - - -  Moving slowly or fidgety/restless 0 - - - -  Suicidal thoughts 0 - - - -  PHQ-9 Score 6 - - - -  Difficult doing work/chores Not difficult at all - - - -     Fall Risk: Fall Risk  07/25/2017 03/16/2017 12/02/2016 10/27/2016 06/30/2016  Falls in the past year? Yes No Yes Yes Yes  Number falls in past yr: 2 or more - 1 2 or more 2 or more  Injury with Fall? Yes - No Yes Yes  Comment - - - fracture pulled neck and back muscles  Risk for fall due to : - - - - Impaired balance/gait  Risk for fall due to: Comment - - - - -  Follow up - - - - Follow up appointment      Functional Status Survey: Is the patient deaf or have difficulty hearing?: No Does the patient have difficulty seeing, even when wearing glasses/contacts?: No Does the patient have difficulty concentrating, remembering, or making decisions?: No Does the patient have difficulty walking or climbing stairs?: No Does the patient have difficulty dressing or bathing?: No Does the patient have difficulty doing errands alone such as visiting a doctor's office or shopping?: No    Assessment & Plan  1. Dyslipidemia associated with type 2 diabetes mellitus (HCC)  - Hemoglobin A1c - metFORMIN (GLUCOPHAGE) 1000 MG tablet; TAKE 1/2 TABLETS (500 MG TOTAL) BY MOUTH 2 (TWO) TIMES DAILY WITH A MEAL.  Dispense: 90 tablet; Refill: 0  2. Screening for HIV (human immunodeficiency virus)  refused  3. Senile purpura ([place])  Reassurance  4. Need for hepatitis C screening test  - Hepatitis C  antibody  5. Breast cancer screening  - MM Digital Screening  6. Pure hypercholesterolemia  - atorvastatin (LIPITOR) 20 MG tablet; Take 1 tablet (20 mg total) by mouth daily.  Dispense: 90 tablet; Refill: 1  7. Allergic rhinitis, seasonal  - desloratadine (CLARINEX) 5 MG tablet; Take 1 tablet (5 mg total) by mouth daily.  Dispense: 90 tablet; Refill: 1  8. GERD without esophagitis  - ranitidine (ZANTAC) 150 MG tablet; Take 1 tablet (150 mg total) by mouth 2 (two) times daily as needed for heartburn.  Dispense: 180 tablet; Refill: 1  9. Vitamin B12 deficiency  Last level at goal   10. Vitamin D deficiency  Continue supplementation   11. Recurrent major depressive disorder, in partial remission ([place])  She is upset about diagnosis, but given by psychiatrist in the past, she wants to wean off medication  - escitalopram (LEXAPRO) 5 MG tablet; Take 1 tablet (5 mg total) by mouth daily.  Dispense: 90 tablet; Refill: 0  12. White matter abnormality on MRI of brain  - Ambulatory referral to Neurology  13. Recurrent falls  - Ambulatory referral to Physical Therapy  14. Balance problem  - Ambulatory referral to Physical Therapy  15. Controlled type 2 diabetes with neuropathy (HCC)  Tingling and numbness on both feet   16. Bilateral hearing loss, unspecified hearing loss type  - Ambulatory referral to ENT

## 2017-07-26 LAB — HEMOGLOBIN A1C
HEMOGLOBIN A1C: 6.3 %{Hb} — AB (ref <5.7)
MEAN PLASMA GLUCOSE: 134 (calc)
eAG (mmol/L): 7.4 (calc)

## 2017-07-26 LAB — HEPATITIS C ANTIBODY
HEP C AB: NONREACTIVE
SIGNAL TO CUT-OFF: 0.17 (ref <1.00)

## 2017-08-24 ENCOUNTER — Other Ambulatory Visit: Payer: Self-pay | Admitting: Family Medicine

## 2017-09-02 ENCOUNTER — Other Ambulatory Visit: Payer: Self-pay

## 2017-09-02 DIAGNOSIS — F3341 Major depressive disorder, recurrent, in partial remission: Secondary | ICD-10-CM

## 2017-09-02 MED ORDER — ESCITALOPRAM OXALATE 5 MG PO TABS: 5.0000 mg | ORAL_TABLET | Freq: Every day | ORAL | 0 refills | Status: DC

## 2017-09-02 NOTE — Telephone Encounter (Signed)
Refill request was sent to Dr. Krichna Sowles for approval and submission.  

## 2017-09-03 ENCOUNTER — Other Ambulatory Visit: Payer: Self-pay

## 2017-09-03 ENCOUNTER — Inpatient Hospital Stay (HOSPITAL_COMMUNITY)
Admission: EM | Admit: 2017-09-03 | Discharge: 2017-09-08 | DRG: 390 | Disposition: A | Discharge status: Home / Self Care | Payer: Medicare Other | Attending: Internal Medicine | Admitting: Internal Medicine

## 2017-09-03 DIAGNOSIS — Z79899 Other long term (current) drug therapy: Secondary | ICD-10-CM

## 2017-09-03 DIAGNOSIS — Z833 Family history of diabetes mellitus: Secondary | ICD-10-CM

## 2017-09-03 DIAGNOSIS — F3341 Major depressive disorder, recurrent, in partial remission: Secondary | ICD-10-CM | POA: Diagnosis present

## 2017-09-03 DIAGNOSIS — R159 Full incontinence of feces: Secondary | ICD-10-CM | POA: Diagnosis not present

## 2017-09-03 DIAGNOSIS — K644 Residual hemorrhoidal skin tags: Secondary | ICD-10-CM | POA: Diagnosis present

## 2017-09-03 DIAGNOSIS — R32 Unspecified urinary incontinence: Secondary | ICD-10-CM | POA: Diagnosis present

## 2017-09-03 DIAGNOSIS — K5641 Fecal impaction: Principal | ICD-10-CM | POA: Diagnosis present

## 2017-09-03 DIAGNOSIS — K5900 Constipation, unspecified: Secondary | ICD-10-CM

## 2017-09-03 DIAGNOSIS — D649 Anemia, unspecified: Secondary | ICD-10-CM | POA: Diagnosis present

## 2017-09-03 DIAGNOSIS — I10 Essential (primary) hypertension: Secondary | ICD-10-CM | POA: Diagnosis present

## 2017-09-03 DIAGNOSIS — Z7982 Long term (current) use of aspirin: Secondary | ICD-10-CM

## 2017-09-03 DIAGNOSIS — E119 Type 2 diabetes mellitus without complications: Secondary | ICD-10-CM | POA: Diagnosis present

## 2017-09-03 DIAGNOSIS — Z91041 Radiographic dye allergy status: Secondary | ICD-10-CM

## 2017-09-03 DIAGNOSIS — Z7984 Long term (current) use of oral hypoglycemic drugs: Secondary | ICD-10-CM

## 2017-09-03 DIAGNOSIS — I739 Peripheral vascular disease, unspecified: Secondary | ICD-10-CM | POA: Diagnosis present

## 2017-09-03 DIAGNOSIS — Z6833 Body mass index (BMI) 33.0-33.9, adult: Secondary | ICD-10-CM

## 2017-09-03 DIAGNOSIS — Z8542 Personal history of malignant neoplasm of other parts of uterus: Secondary | ICD-10-CM

## 2017-09-03 DIAGNOSIS — G4730 Sleep apnea, unspecified: Secondary | ICD-10-CM | POA: Diagnosis present

## 2017-09-03 DIAGNOSIS — Z818 Family history of other mental and behavioral disorders: Secondary | ICD-10-CM

## 2017-09-03 DIAGNOSIS — Z87891 Personal history of nicotine dependence: Secondary | ICD-10-CM

## 2017-09-03 DIAGNOSIS — Z885 Allergy status to narcotic agent status: Secondary | ICD-10-CM

## 2017-09-03 DIAGNOSIS — E042 Nontoxic multinodular goiter: Secondary | ICD-10-CM | POA: Diagnosis present

## 2017-09-03 DIAGNOSIS — Z9884 Bariatric surgery status: Secondary | ICD-10-CM

## 2017-09-03 DIAGNOSIS — E669 Obesity, unspecified: Secondary | ICD-10-CM | POA: Diagnosis present

## 2017-09-03 DIAGNOSIS — F419 Anxiety disorder, unspecified: Secondary | ICD-10-CM | POA: Diagnosis present

## 2017-09-03 HISTORY — DX: Pure hypercholesterolemia, unspecified: E78.00

## 2017-09-03 HISTORY — DX: Personal history of other medical treatment: Z92.89

## 2017-09-03 HISTORY — DX: Malignant neoplasm of uterus, part unspecified: C55

## 2017-09-03 HISTORY — DX: Gastro-esophageal reflux disease without esophagitis: K21.9

## 2017-09-03 LAB — URINALYSIS, ROUTINE W REFLEX MICROSCOPIC
BACTERIA UA: NONE SEEN
BILIRUBIN URINE: NEGATIVE
GLUCOSE, UA: NEGATIVE mg/dL
KETONES UR: NEGATIVE mg/dL
NITRITE: NEGATIVE
PH: 8 (ref 5.0–8.0)
Protein, ur: NEGATIVE mg/dL
SPECIFIC GRAVITY, URINE: 1.005 (ref 1.005–1.030)

## 2017-09-03 LAB — COMPREHENSIVE METABOLIC PANEL
ALBUMIN: 3.9 g/dL (ref 3.5–5.0)
ALT: 22 U/L (ref 0–44)
ANION GAP: 10 (ref 5–15)
AST: 22 U/L (ref 15–41)
Alkaline Phosphatase: 66 U/L (ref 38–126)
BILIRUBIN TOTAL: 0.7 mg/dL (ref 0.3–1.2)
BUN: 5 mg/dL — ABNORMAL LOW (ref 8–23)
CO2: 25 mmol/L (ref 22–32)
Calcium: 8.9 mg/dL (ref 8.9–10.3)
Chloride: 102 mmol/L (ref 98–111)
Creatinine, Ser: 0.7 mg/dL (ref 0.44–1.00)
GFR calc non Af Amer: >60 mL/min (ref >60)
GLUCOSE: 144 mg/dL — AB (ref 70–99)
POTASSIUM: 3.9 mmol/L (ref 3.5–5.1)
SODIUM: 137 mmol/L (ref 135–145)
TOTAL PROTEIN: 7 g/dL (ref 6.5–8.1)

## 2017-09-03 LAB — CBC WITH DIFFERENTIAL/PLATELET
Abs Immature Granulocytes: 0 10*3/uL (ref 0.0–0.1)
BASOS ABS: 0.1 10*3/uL (ref 0.0–0.1)
Basophils Relative: 1 %
EOS ABS: 0.2 10*3/uL (ref 0.0–0.7)
EOS PCT: 2 %
HEMATOCRIT: 42.8 % (ref 36.0–46.0)
Hemoglobin: 13.6 g/dL (ref 12.0–15.0)
Immature Granulocytes: 0 %
Lymphocytes Relative: 19 %
Lymphs Abs: 1.9 10*3/uL (ref 0.7–4.0)
MCH: 28.6 pg (ref 26.0–34.0)
MCHC: 31.8 g/dL (ref 30.0–36.0)
MCV: 89.9 fL (ref 78.0–100.0)
Monocytes Absolute: 0.7 10*3/uL (ref 0.1–1.0)
Monocytes Relative: 7 %
Neutro Abs: 7.2 10*3/uL (ref 1.7–7.7)
Neutrophils Relative %: 71 %
Platelets: 258 10*3/uL (ref 150–400)
RBC: 4.76 MIL/uL (ref 3.87–5.11)
RDW: 13.8 % (ref 11.5–15.5)
WBC: 10 10*3/uL (ref 4.0–10.5)

## 2017-09-03 LAB — LIPASE, BLOOD: Lipase: 42 U/L (ref 11–51)

## 2017-09-03 MED ORDER — MORPHINE SULFATE (PF) 4 MG/ML IV SOLN
4.0000 mg | Freq: Once | INTRAVENOUS | Status: AC
  Administered 2017-09-03: 4 mg via INTRAVENOUS
  Filled 2017-09-03: qty 1

## 2017-09-03 MED ORDER — MAGNESIUM HYDROXIDE 400 MG/5ML PO SUSP
15.0000 mL | Freq: Once | ORAL | Status: AC
  Administered 2017-09-04: 15 mL via ORAL
  Filled 2017-09-03: qty 30

## 2017-09-03 MED ORDER — ONDANSETRON HCL 4 MG/2ML IJ SOLN
4.0000 mg | Freq: Once | INTRAMUSCULAR | Status: AC
  Administered 2017-09-03: 4 mg via INTRAVENOUS
  Filled 2017-09-03: qty 2

## 2017-09-03 MED ORDER — SODIUM CHLORIDE 0.9 % IV SOLN
Freq: Once | INTRAVENOUS | Status: AC
Start: 2017-09-03 — End: 2017-09-04
  Administered 2017-09-03: via INTRAVENOUS

## 2017-09-03 NOTE — ED Provider Notes (Signed)
[facility] EMERGENCY DEPARTMENT Provider Note   CSN: [id number] Arrival date & time: 09/03/17  2044     History   Chief Complaint Chief Complaint  Patient presents with  . Constipation    HPI Brooke Liu is a 67 y.o. female w/ h/o gastric bypass surgery, total hysterectomy and oophorectomy, c-sections, hernia repair s/p repair, cholecystectomy, HLD, fecal impaction, SBO here for evaluation of constipation. She is concerned she is impacted again, her symptoms today feel similar to last time she was impacted. Has h/o fecal impaction that required disimpaction under sedation and SBO s/p surgery. Noticed BM slowing down 2.5 weeks ago so she started increasing he protein intake. Has been consuming protein drinks BID and baby food, in addition to stool softeners, metamucil and increased water intake.  Her last BM was earlier this week, has been passing gas but less than her usual. Associated symptoms include nausea and generalized abdominal pain, constant, moderate. Today she has tired mineral oil enema that would not go in. Also attempted digital disimpaction and felt very hard stool in her rectum.  Noticed when she stood up she had urine and fecal leakage.  Having bladder pressure and urgency, feels like she has to void urine but unable to.   No fevers, chills, vomiting, urinary frequency, dysuria, hematuria. HPI  Past Medical History:  Diagnosis Date  . Anemia   . Anxiety   . 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 Depression   . Diabetes mellitus, type II ([place])   . Headache    stress/sinus  . Hemorrhoids   . Motion sickness   . Multiple thyroid nodules   . Sleep apnea    past hx.  resolved with wt loss  . Vertigo    sinus related    Patient Active Problem List   Diagnosis Date Noted  . Senile purpura ([place]) 07/25/2017  . Recurrent major depressive disorder, in partial remission ([place]) 07/25/2017  . 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 Head injury 06/30/2016    . White matter abnormality on MRI of brain 05/04/2016  . Iron deficiency anemia   . Fatigue 10/03/2015  . History of gastric bypass 10/03/2015  . Dyslipidemia 10/02/2014  . Carotid artery narrowing 09/25/2014  . Eczema of both hands 09/25/2014  . Benign hypertension 09/25/2014  . Allergic rhinitis, seasonal 09/25/2014  . Diabetes mellitus, type 2 ([place]) 09/05/2014  . Multinodular goiter 01/14/2014  . Dysthymia 10/15/2013  . 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 Difficulty in walking 09/25/2013  . Loss of feeling or sensation 09/25/2013  . Cephalalgia 09/25/2013  . Cervical pain 09/25/2013    Past Surgical History:  Procedure Laterality Date  . ABDOMINAL HYSTERECTOMY    . BACK SURGERY  2010   lumbar  . CESAREAN SECTION    . CHOLECYSTECTOMY    . COLONOSCOPY WITH PROPOFOL N/A 11/21/2015   Procedure: COLONOSCOPY WITH PROPOFOL;  Surgeon: Lucilla Lame, MD;  Location: [place];  Service: Endoscopy;  Laterality: N/A;  . ESOPHAGEAL MANOMETRY N/A 12/22/2016   Procedure: ESOPHAGEAL MANOMETRY (EM);  Surgeon: Mauri Pole, MD;  [facility];  Service: Endoscopy;  Laterality: N/A;  . ESOPHAGOGASTRODUODENOSCOPY (EGD) WITH PROPOFOL N/A 11/21/2015   Procedure: ESOPHAGOGASTRODUODENOSCOPY (EGD) WITH PROPOFOL;  Surgeon: Lucilla Lame, MD;  Location: [place];  Service: Endoscopy;  Laterality: N/A;  Diabetic - oral meds  . GASTRIC BYPASS  2000  . HERNIA REPAIR    . SMALL BOWEL REPAIR  OB History   None      Home Medications    Prior to Admission medications   Medication Sig Start Date End Date Taking? Authorizing Provider  acetaminophen (TYLENOL) 500 MG tablet Take 500 mg by mouth every 6 (six) hours as needed for mild pain, moderate pain or headache.    [provider]  aspirin EC 81 MG tablet Take 81 mg by mouth daily.    [provider]  atorvastatin (LIPITOR) 20 MG tablet Take 1 tablet (20 mg total) by mouth daily. 07/25/17    Steele Sizer, MD  desloratadine (CLARINEX) 5 MG tablet Take 1 tablet (5 mg total) by mouth daily. 07/25/17   Steele Sizer, MD  ergocalciferol (VITAMIN D2) 50000 units capsule Take 1 capsule (50,000 Units total) by mouth once a week. 07/18/17   Steele Sizer, MD  escitalopram (LEXAPRO) 5 MG tablet Take 1 tablet (5 mg total) by mouth daily. 09/02/17   Steele Sizer, MD  fluticasone (FLONASE) 50 MCG/ACT nasal spray Place 2 sprays into both nostrils daily. 05/04/16   Roselee Nova, MD  IRON PO Take 325 mg by mouth daily.    [provider]  metFORMIN (GLUCOPHAGE) 1000 MG tablet TAKE 1/2 TABLETS (500 MG TOTAL) BY MOUTH 2 (TWO) TIMES DAILY WITH A MEAL. 07/25/17   Sowles, Drue Stager, MD  mometasone (ELOCON) 0.1 % ointment Apply topically daily. 10/22/16   Roselee Nova, MD  Multiple Vitamin (MULTI-VITAMINS) TABS Take by mouth.    [provider]  ranitidine (ZANTAC) 150 MG tablet Take 1 tablet (150 mg total) by mouth 2 (two) times daily as needed for heartburn. 07/25/17   Steele Sizer, MD  triamcinolone ointment (KENALOG) 0.5 % Apply 1 application topically 2 (two) times daily. 03/16/17   Roselee Nova, MD    Family History Family History  Problem Relation Age of Onset  . Depression Mother   . Diabetes Mother   . Heart disease Mother   . Heart disease Father   . Diabetes Sister   . Diabetes Brother   . Dementia Brother   . Heart disease Brother   . Diabetes Sister   . Fibromyalgia Sister   . Diabetes Brother   . Heart disease Brother   . Diabetes Brother   . Heart disease Brother   . Leukemia Brother   . Cancer Brother   . Liver disease Brother   . Alcohol abuse Brother   . Throat cancer Brother   . Heart disease Brother   . Diabetes Brother     Social History Social History   Tobacco Use  . Smoking status: Former Smoker    Years: 15.00    Types: Cigarettes    Start date: 03/08/1962    Last attempt to quit: 09/24/1977    Years since quitting: 39.9   . Smokeless tobacco: Never Used  Substance Use Topics  . Alcohol use: No    Alcohol/week: 0.0 oz  . Drug use: No     Allergies   Oxycodone-acetaminophen; Percocet [oxycodone-acetaminophen]; and Iodinated diagnostic agents   Review of Systems Review of Systems  Gastrointestinal: Positive for abdominal pain, constipation, nausea and rectal pain.  All other systems reviewed and are negative.    Physical Exam Updated Vital Signs BP (!) 105/59   Pulse 65   Temp 97.8 F (36.6 C) (Oral)   Resp 15   Ht 5\' 2"  (1.575 m)   Wt 82.6 kg (182 lb)   SpO2 99%  BMI 33.29 kg/m   Physical Exam  Constitutional: She is oriented to person, place, and time. She appears well-developed and well-nourished. No distress.  Non toxic  HENT:  Head: Normocephalic and atraumatic.  Nose: Nose normal.  Moist mucous membranes   Eyes: Pupils are equal, round, and reactive to light. Conjunctivae and EOM are normal.  Neck: Normal range of motion.  Cardiovascular: Normal rate and regular rhythm.  Pulmonary/Chest: Effort normal and breath sounds normal.  Abdominal: Soft. Bowel sounds are normal. There is tenderness.  Minimal generalized lower abdominal tenderness. Active BS in all quadrants. No G/R/R. No CVA tenderness. Negative Murphy's and McBurney's. No distention.  Genitourinary:  Genitourinary Comments:  DRE performed with EMT at bedside 1 minimally tender non thrombosed non bleeding external hemorrhoid at 9 o'clock. No fissures.  Hard stool palpated in proximal rectal vault, unable to remove. Moderate discomfort with exam, pt requested exam to stop due to pain  Musculoskeletal: Normal range of motion.  Neurological: She is alert and oriented to person, place, and time.  Skin: Skin is warm and dry. Capillary refill takes less than 2 seconds.  Psychiatric: She has a normal mood and affect. Her behavior is normal.  Nursing note and vitals reviewed.    ED Treatments / Results  Labs (all labs  ordered are listed, but only abnormal results are displayed) Labs Reviewed  COMPREHENSIVE METABOLIC PANEL - Abnormal; Notable for the following components:      Result Value   Glucose, Bld 144 (*)    BUN 5 (*)    All other components within normal limits  URINALYSIS, ROUTINE W REFLEX MICROSCOPIC - Abnormal; Notable for the following components:   Hgb urine dipstick SMALL (*)    Leukocytes, UA MODERATE (*)    All other components within normal limits  URINE CULTURE  CBC WITH DIFFERENTIAL/PLATELET  LIPASE, BLOOD    EKG None  Radiology No results found.  Procedures Procedures (including critical care time)  Medications Ordered in ED Medications  magnesium hydroxide (MILK OF MAGNESIA) suspension 15 mL (has no administration in time range)  0.9 %  sodium chloride infusion ( Intravenous New Bag/Given 09/03/17 2351)  ondansetron (ZOFRAN) injection 4 mg (4 mg Intravenous Given 09/03/17 2351)  morphine 4 MG/ML injection 4 mg (4 mg Intravenous Given 09/03/17 2351)     Initial Impression / Assessment and Plan / ED Course  I have reviewed the triage vital signs and the nursing notes.  Pertinent labs & imaging results that were available during my care of the patient were reviewed by me and considered in my medical decision making (see chart for details).  Clinical Course as of Sep 05 3  Sat Sep 03, 2017  2331 Leukocytes, UA(!): MODERATE [CG]  2331 WBC, UA: 11-20 [CG]    Clinical Course User Index [CG] Kinnie Feil, PA-C   67 yo here with constipation. H/o gastric bypass, SBO, fecal impaction and other abdominal surgeries.   Has nausea and generalized abdominal and rectal discomfort. No fevers. +flatus. Last BM earlier this week. Symptoms refractory to home interventions.   Exam remarkable for impaction, unable to disimpact due to pt discomfort. Abd exam reassuring as above. No peritonitis. Exam is less convincing for SBO given mild symptoms.   0002: Work up remarkable  for moderate leukocytes, 11-20 WBC, small hgb and no bacteria seen.  UA not convincing of infection, will send for culture. My suspicion is highest for impaction, less for SBO. Pt has IV contrast allergy. Pt discussed  with Dr Wilson Singer. We will order enema, milk of mag, IVF in hopes for BM.  Pt will be handed off to oncoming EDPA who will re-evaluate and determine disposition. Consider further imaging.   Final Clinical Impressions(s) / ED Diagnoses   Final diagnoses:  Fecal impaction [facility])    ED Discharge Orders    None       Arlean Hopping 09/04/17 0005    Virgel Manifold, MD 09/04/17 [phone]

## 2017-09-03 NOTE — ED Triage Notes (Signed)
Patient states that she is concerned that she is having a fecal impaction. Has been using Metamucil, stool softeners, mineral oil enema. Has large amount of pressure in rectum.

## 2017-09-04 ENCOUNTER — Encounter (HOSPITAL_COMMUNITY): Payer: Self-pay | Admitting: Family Medicine

## 2017-09-04 ENCOUNTER — Observation Stay (HOSPITAL_COMMUNITY): Payer: Medicare Other

## 2017-09-04 DIAGNOSIS — K5900 Constipation, unspecified: Secondary | ICD-10-CM | POA: Diagnosis not present

## 2017-09-04 DIAGNOSIS — K5641 Fecal impaction: Secondary | ICD-10-CM | POA: Diagnosis not present

## 2017-09-04 DIAGNOSIS — E119 Type 2 diabetes mellitus without complications: Secondary | ICD-10-CM | POA: Diagnosis not present

## 2017-09-04 DIAGNOSIS — K6289 Other specified diseases of anus and rectum: Secondary | ICD-10-CM

## 2017-09-04 DIAGNOSIS — F3341 Major depressive disorder, recurrent, in partial remission: Secondary | ICD-10-CM

## 2017-09-04 DIAGNOSIS — E669 Obesity, unspecified: Secondary | ICD-10-CM

## 2017-09-04 LAB — GLUCOSE, CAPILLARY
GLUCOSE-CAPILLARY: 103 mg/dL — AB (ref 70–99)
GLUCOSE-CAPILLARY: 106 mg/dL — AB (ref 70–99)
GLUCOSE-CAPILLARY: 105 mg/dL — AB (ref 70–99)
Glucose-Capillary: 115 mg/dL — ABNORMAL HIGH (ref 70–99)

## 2017-09-04 MED ORDER — INSULIN ASPART 100 UNIT/ML ~~LOC~~ SOLN
0.0000 [IU] | SUBCUTANEOUS | Status: DC
  Administered 2017-09-05: 2 [IU] via SUBCUTANEOUS
  Administered 2017-09-05 – 2017-09-06 (×2): 3 [IU] via SUBCUTANEOUS
  Administered 2017-09-06 – 2017-09-07 (×3): 1 [IU] via SUBCUTANEOUS
  Administered 2017-09-07: 2 [IU] via SUBCUTANEOUS
  Administered 2017-09-07 (×2): 1 [IU] via SUBCUTANEOUS
  Administered 2017-09-08: 2 [IU] via SUBCUTANEOUS
  Administered 2017-09-08: 1 [IU] via SUBCUTANEOUS

## 2017-09-04 MED ORDER — ACETAMINOPHEN 650 MG RE SUPP: 650.0000 mg | Freq: Four times a day (QID) | RECTAL | Status: DC | PRN

## 2017-09-04 MED ORDER — SODIUM CHLORIDE 0.9 % IV SOLN
INTRAVENOUS | Status: AC
  Administered 2017-09-04: 17:00:00 via INTRAVENOUS

## 2017-09-04 MED ORDER — MAGNESIUM CITRATE PO SOLN: 1.0000 | Freq: Once | ORAL | Status: DC

## 2017-09-04 MED ORDER — ATORVASTATIN CALCIUM 20 MG PO TABS
20.0000 mg | ORAL_TABLET | Freq: Every day | ORAL | Status: DC
  Administered 2017-09-04 – 2017-09-08 (×5): 20 mg via ORAL
  Filled 2017-09-04 (×5): qty 1

## 2017-09-04 MED ORDER — MINERAL OIL RE ENEM
1.0000 | ENEMA | Freq: Once | RECTAL | Status: DC
  Filled 2017-09-04 (×2): qty 1

## 2017-09-04 MED ORDER — ONDANSETRON HCL 4 MG PO TABS: 4.0000 mg | ORAL_TABLET | Freq: Four times a day (QID) | ORAL | Status: DC | PRN

## 2017-09-04 MED ORDER — SODIUM CHLORIDE 0.9 % IV SOLN
INTRAVENOUS | Status: AC
  Administered 2017-09-04: 07:00:00 via INTRAVENOUS

## 2017-09-04 MED ORDER — PEG-KCL-NACL-NASULF-NA ASC-C 100 G PO SOLR
1.0000 | Freq: Once | ORAL | Status: DC
  Filled 2017-09-04: qty 1

## 2017-09-04 MED ORDER — METOCLOPRAMIDE HCL 5 MG/ML IJ SOLN: 10.0000 mg | Freq: Once | INTRAMUSCULAR | Status: DC

## 2017-09-04 MED ORDER — MILK AND MOLASSES ENEMA
1.0000 | Freq: Once | RECTAL | Status: DC
  Filled 2017-09-04: qty 250

## 2017-09-04 MED ORDER — LIDOCAINE HCL URETHRAL/MUCOSAL 2 % EX GEL
1.0000 "application " | Freq: Once | CUTANEOUS | Status: AC
  Administered 2017-09-04: 1 via TOPICAL
  Filled 2017-09-04: qty 20

## 2017-09-04 MED ORDER — ACETAMINOPHEN 325 MG PO TABS
650.0000 mg | ORAL_TABLET | Freq: Four times a day (QID) | ORAL | Status: DC | PRN
  Administered 2017-09-06 – 2017-09-07 (×3): 650 mg via ORAL
  Filled 2017-09-04 (×3): qty 2

## 2017-09-04 MED ORDER — FENTANYL CITRATE (PF) 100 MCG/2ML IJ SOLN
25.0000 ug | INTRAMUSCULAR | Status: DC | PRN
  Administered 2017-09-04 – 2017-09-05 (×3): 50 ug via INTRAVENOUS
  Administered 2017-09-05: 100 ug via INTRAVENOUS
  Administered 2017-09-05: 50 ug via INTRAVENOUS
  Administered 2017-09-05: 100 ug via INTRAVENOUS
  Administered 2017-09-05 – 2017-09-08 (×7): 50 ug via INTRAVENOUS
  Filled 2017-09-04 (×11): qty 2

## 2017-09-04 MED ORDER — ESCITALOPRAM OXALATE 10 MG PO TABS
5.0000 mg | ORAL_TABLET | Freq: Every day | ORAL | Status: DC
  Administered 2017-09-04 – 2017-09-08 (×5): 5 mg via ORAL
  Filled 2017-09-04 (×5): qty 1

## 2017-09-04 MED ORDER — ONDANSETRON HCL 4 MG/2ML IJ SOLN
4.0000 mg | Freq: Four times a day (QID) | INTRAMUSCULAR | Status: DC | PRN
  Administered 2017-09-04 – 2017-09-05 (×2): 4 mg via INTRAVENOUS
  Filled 2017-09-04 (×2): qty 2

## 2017-09-04 NOTE — Progress Notes (Signed)
   Follow Up Note  HPI: 67 year old female with past medical history of obesity, status post gastric bypass surgery 15 years ago, diabetes mellitus and depression who presents the emergency room after being unable to move her bowels for the past 7 days.  Patient has had some issues with constipation, but usually keeps herself regular by drinking several cups of coffee.  She feels like her problem started with a combination of her switching over to mostly baby food as of late, restarting her mineral vitamins and less coffee.  She tried multiple medication to try to make her move her bowels, but this is lead to abdominal cramping but no actual bowel movements.  She is been unable to place an enema because her rectum has been so swollen and inflamed.  She presented to the emergency room and no response to milk of magnesia or soapsuds enema.  Patient underwent abdominal x-ray which noted significant amount of stool throughout the colon and a large amount of stool in the rectal area. Pt admitted earlier this morning.  Seen after arrived to floor.  She complains of a lot of abdominal cramping and tender rectal area  Exam: CV: Regular rate and rhythm, S1-S2 Lungs: Clear to auscultation bilaterally Abd: Soft, some generalized nonspecific tenderness mostly in the lower quadrants, hyperactive bowel sounds Ext: No clubbing cyanosis or edema  Principal Problem:   Fecal impaction in rectum [facility]): Continue hydration.  Have consulted  GI for assistance Active Problems:   Diabetes mellitus, type 2 ([place]): A1c last month at 6.3.  Managed with metformin at home, currently on hold.  Sliding scale only   Recurrent major depressive disorder, in partial remission ([place]): On Lexapro   Obesity (BMI 30-39.9): Meets criteria with BMI greater than 30   Disposition: Home once bowels better evacuated

## 2017-09-04 NOTE — Consult Note (Signed)
Reason for Consult:Obstipation Referring Physician: Leilyn Frayre is an 67 y.o. female.  HPI: Patient has not had a bowel movement for the last two weeks.  Patient seems to be reliable.  She has had multiple cathartics and enemas tried.  Her hemorrhoids are now very uncomfortable.    Past Medical History:  Diagnosis Date  . Anemia   . Anxiety   . 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 Depression   . Diabetes mellitus, type II ([place])   . Headache    stress/sinus  . Hemorrhoids   . Motion sickness   . Multiple thyroid nodules   . Sleep apnea    past hx.  resolved with wt loss  . Vertigo    sinus related    Past Surgical History:  Procedure Laterality Date  . ABDOMINAL HYSTERECTOMY    . BACK SURGERY  2010   lumbar  . CESAREAN SECTION    . CHOLECYSTECTOMY    . COLONOSCOPY WITH PROPOFOL N/A 11/21/2015   Procedure: COLONOSCOPY WITH PROPOFOL;  Surgeon: Lucilla Lame, MD;  Location: [facility];  Service: Endoscopy;  Laterality: N/A;  . ESOPHAGEAL MANOMETRY N/A 12/22/2016   Procedure: ESOPHAGEAL MANOMETRY (EM);  Surgeon: Mauri Pole, MD;  [facility];  Service: Endoscopy;  Laterality: N/A;  . ESOPHAGOGASTRODUODENOSCOPY (EGD) WITH PROPOFOL N/A 11/21/2015   Procedure: ESOPHAGOGASTRODUODENOSCOPY (EGD) WITH PROPOFOL;  Surgeon: Lucilla Lame, MD;  Location: [place];  Service: Endoscopy;  Laterality: N/A;  Diabetic - oral meds  . GASTRIC BYPASS  2000  . HERNIA REPAIR    . SMALL BOWEL REPAIR      Family History  Problem Relation Age of Onset  . Depression Mother   . Diabetes Mother   . Heart disease Mother   . Heart disease Father   . Diabetes Sister   . Diabetes Brother   . Dementia Brother   . Heart disease Brother   . Diabetes Sister   . Fibromyalgia Sister   . Diabetes Brother   . Heart disease Brother   . Diabetes Brother   . Heart disease Brother   . Leukemia Brother   . Cancer Brother   . Liver disease Brother   . Alcohol  abuse Brother   . Throat cancer Brother   . Heart disease Brother   . Diabetes Brother     Social History:  reports that she quit smoking about 39 years ago. Her smoking use included cigarettes. She started smoking about 55 years ago. She quit after 15.00 years of use. She has never used smokeless tobacco. She reports that she does not drink alcohol or use drugs.  Allergies:  Allergies  Allergen Reactions  . Adhesive [Tape] Other (See Comments)    Patient's skin is VERY THIN and TEARS VERY EASILY!! PLEASE USE COBAN WRAP!!  . Oxycodone-Acetaminophen Hives and Itching    Very itchy underneath the skin  . Percocet [Oxycodone-Acetaminophen] Hives and Itching    Very itchy underneath the skin  . Iodinated Diagnostic Agents Itching and Rash    MRI Dye    Medications: I have reviewed the patient's current medications.  Results for orders placed or performed during the hospital encounter of 09/03/17 (from the past 48 hour(s))  CBC with Differential     Status: None   Collection Time: 09/03/17 10:50 PM  Result Value Ref Range   WBC 10.0 4.0 - 10.5 K/uL   RBC 4.76 3.87 - 5.11 MIL/uL   Hemoglobin  13.6 12.0 - 15.0 g/dL   HCT 42.8 36.0 - 46.0 %   MCV 89.9 78.0 - 100.0 fL   MCH 28.6 26.0 - 34.0 pg   MCHC 31.8 30.0 - 36.0 g/dL   RDW 13.8 11.5 - 15.5 %   Platelets 258 150 - 400 K/uL   Neutrophils Relative % 71 %   Neutro Abs 7.2 1.7 - 7.7 K/uL   Lymphocytes Relative 19 %   Lymphs Abs 1.9 0.7 - 4.0 K/uL   Monocytes Relative 7 %   Monocytes Absolute 0.7 0.1 - 1.0 K/uL   Eosinophils Relative 2 %   Eosinophils Absolute 0.2 0.0 - 0.7 K/uL   Basophils Relative 1 %   Basophils Absolute 0.1 0.0 - 0.1 K/uL   Immature Granulocytes 0 %   Abs Immature Granulocytes 0.0 0.0 - 0.1 K/uL    Comment: Performed at [facility] [address], [place], [place] [address]  Comprehensive metabolic panel     Status: Abnormal   Collection Time: 09/03/17 10:50 PM  Result Value Ref Range   Sodium 137  135 - 145 mmol/L   Potassium 3.9 3.5 - 5.1 mmol/L   Chloride 102 98 - 111 mmol/L    Comment: Please note change in reference range.   CO2 25 22 - 32 mmol/L   Glucose, Bld 144 (H) 70 - 99 mg/dL    Comment: Please note change in reference range.   BUN 5 (L) 8 - 23 mg/dL    Comment: Please note change in reference range.   Creatinine, Ser 0.70 0.44 - 1.00 mg/dL   Calcium 8.9 8.9 - 10.3 mg/dL   Total Protein 7.0 6.5 - 8.1 g/dL   Albumin 3.9 3.5 - 5.0 g/dL   AST 22 15 - 41 U/L   ALT 22 0 - 44 U/L    Comment: Please note change in reference range.   Alkaline Phosphatase 66 38 - 126 U/L   Total Bilirubin 0.7 0.3 - 1.2 mg/dL   GFR calc non Af Amer >60 >60 mL/min   GFR calc Af Amer >60 >60 mL/min    Comment: (NOTE) The eGFR has been calculated using the CKD EPI equation. This calculation has not been validated in all clinical situations. eGFR's persistently <60 mL/min signify possible Chronic Kidney Disease.    Anion gap 10 5 - 15    Comment: Performed at [facility] [address] [place], [place] [address]  Lipase, blood     Status: None   Collection Time: 09/03/17 10:50 PM  Result Value Ref Range   Lipase 42 11 - 51 U/L    Comment: Performed at [facility] [address], [place], [place] [address]  Urinalysis, Routine w reflex microscopic     Status: Abnormal   Collection Time: 09/03/17 10:54 PM  Result Value Ref Range   Color, Urine YELLOW YELLOW   APPearance CLEAR CLEAR   Specific Gravity, Urine 1.005 1.005 - 1.030   pH 8.0 5.0 - 8.0   Glucose, UA NEGATIVE NEGATIVE mg/dL   Hgb urine dipstick SMALL (A) NEGATIVE   Bilirubin Urine NEGATIVE NEGATIVE   Ketones, ur NEGATIVE NEGATIVE mg/dL   Protein, ur NEGATIVE NEGATIVE mg/dL   Nitrite NEGATIVE NEGATIVE   Leukocytes, UA MODERATE (A) NEGATIVE   RBC / HPF 0-5 0 - 5 RBC/hpf   WBC, UA 11-20 0 - 5 WBC/hpf   Bacteria, UA NONE SEEN NONE SEEN   Squamous Epithelial / LPF  0-5 0 - 5    Comment: Performed at [facility], [place] [address], [place], [place] [address]  Glucose, capillary     Status: Abnormal   Collection Time: 09/04/17  8:24 AM  Result Value Ref Range   Glucose-Capillary 103 (H) 70 - 99 mg/dL  Glucose, capillary     Status: Abnormal   Collection Time: 09/04/17  1:07 PM  Result Value Ref Range   Glucose-Capillary 106 (H) 70 - 99 mg/dL  Glucose, capillary     Status: Abnormal   Collection Time: 09/04/17  4:31 PM  Result Value Ref Range   Glucose-Capillary 105 (H) 70 - 99 mg/dL    Abd 1 View (kub)  Result Date: 09/04/2017 CLINICAL DATA:  Fecal impaction EXAM: ABDOMEN - 1 VIEW COMPARISON:  None. FINDINGS: Large amount of stool within the rectum. There is stool throughout the remainder of the colon. Prominent loops of small bowel demonstrated throughout the abdomen. Lung bases are clear. Surgical clips within the right hemiabdomen. Postsurgical change central lower abdomen. Lumbar spine degenerative changes. IMPRESSION: Large amount of stool within the rectum. Additionally there is a large amount of stool within the cecum and ascending colon compatible with associated constipation. Prominent gaseous distended loops of small bowel. Electronically Signed   By: Lovey Newcomer M.D.   On: 09/04/2017 07:30    Review of Systems  Constitutional: Negative for chills and fever.  Gastrointestinal: Positive for abdominal pain. Negative for blood in stool, nausea and vomiting.       Rectal pain  All other systems reviewed and are negative.  Blood pressure (!) 164/87, pulse 72, temperature 98.1 F (36.7 C), temperature source Oral, resp. rate 18, height 5' 2"  (1.575 m), weight 82.6 kg (182 lb), SpO2 100 %. Physical Exam  Constitutional: She appears well-developed and well-nourished.  HENT:  Head: Normocephalic and atraumatic.  Eyes: Pupils are equal, round, and reactive to light. Conjunctivae and EOM are normal.  GI: Normal appearance and bowel sounds are normal. She exhibits distension (Mild  distension). There is tenderness (very mild tenderness). There is no rigidity, no rebound and no guarding.      Assessment/Plan: Obstipation recalcitrant to medical treatment.   Consider rectal disimpaction in the OR under anesthesia.  I told the patient that I would pass this on to the surgeon on for this week and it will be his final decision if EUA/disimpaction will be done tomorrow.  She has refused recent bowel prep regimen recommended by GI.  Judeth Horn 09/04/2017, 5:24 PM

## 2017-09-04 NOTE — ED Notes (Signed)
Pt walking about room to try to encourage bowel movement.

## 2017-09-04 NOTE — ED Notes (Signed)
Pt given soap suds enema.  Retained about half the volume and instructed to hold in as long as she can.

## 2017-09-04 NOTE — H&P (Signed)
History and Physical    Brooke Liu DOB: 06/15/1950 DOA: 09/03/2017  PCP: Steele Sizer, MD   Patient coming from: Home  Chief Complaint: Constipation, rectal discomfort   HPI: Brooke Liu is a 67 y.o. female with medical history significant for depression, type 2 diabetes mellitus, and history of fecal impaction requiring instrumentation under general anesthesia, now presenting to the emergency department for evaluation of constipation and rectal pain.  Patient reports that she has been constipated for several days despite using fiber supplements, mineral oil enema, and stool softeners at home.  She has developed progressive rectal pressure and pain.  She has also developed urinary incontinence. She continues to pas flatus and has passed small amounts of liquid stool. Denies fevers, chills, vomiting, or significant abdominal pain.   ED Course: Upon arrival to the ED, patient is found to be afebrile, saturating well on room air, and with vitals otherwise stable.  Chemistry panel and CBC are unremarkable.  Multiple attempts at manual disimpaction were unsuccessful in the emergency department.  She was treated with milk of magnesia and soapsuds enema without relief.  She will be observed on the medical-surgical unit for ongoing evaluation and management of fecal impaction.  Review of Systems:  All other systems reviewed and apart from HPI, are negative.  Past Medical History:  Diagnosis Date  . Anemia   . Anxiety   . 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 Depression   . Diabetes mellitus, type II ([place])   . Headache    stress/sinus  . Hemorrhoids   . Motion sickness   . Multiple thyroid nodules   . Sleep apnea    past hx.  resolved with wt loss  . Vertigo    sinus related    Past Surgical History:  Procedure Laterality Date  . ABDOMINAL HYSTERECTOMY    . BACK SURGERY  2010   lumbar  . CESAREAN SECTION    . CHOLECYSTECTOMY    . COLONOSCOPY WITH  PROPOFOL N/A 11/21/2015   Procedure: COLONOSCOPY WITH PROPOFOL;  Surgeon: Lucilla Lame, MD;  Location: [place];  Service: Endoscopy;  Laterality: N/A;  . ESOPHAGEAL MANOMETRY N/A 12/22/2016   Procedure: ESOPHAGEAL MANOMETRY (EM);  Surgeon: Mauri Pole, MD;  Location: [facility];  Service: Endoscopy;  Laterality: N/A;  . ESOPHAGOGASTRODUODENOSCOPY (EGD) WITH PROPOFOL N/A 11/21/2015   Procedure: ESOPHAGOGASTRODUODENOSCOPY (EGD) WITH PROPOFOL;  Surgeon: Lucilla Lame, MD;  Location: [place];  Service: Endoscopy;  Laterality: N/A;  Diabetic - oral meds  . GASTRIC BYPASS  2000  . HERNIA REPAIR    . SMALL BOWEL REPAIR       reports that she quit smoking about 39 years ago. Her smoking use included cigarettes. She started smoking about 55 years ago. She quit after 15.00 years of use. She has never used smokeless tobacco. She reports that she does not drink alcohol or use drugs.  Allergies  Allergen Reactions  . Oxycodone-Acetaminophen Hives    Very itchy underneath the skin.  Marland Kitchen Percocet [Oxycodone-Acetaminophen] Hives    Very itchy underneath the skin.  . Iodinated Diagnostic Agents Itching    MRI Dye    Family History  Problem Relation Age of Onset  . Depression Mother   . Diabetes Mother   . Heart disease Mother   . Heart disease Father   . Diabetes Sister   . Diabetes Brother   . Dementia Brother   . Heart disease Brother   . Diabetes  Sister   . Fibromyalgia Sister   . Diabetes Brother   . Heart disease Brother   . Diabetes Brother   . Heart disease Brother   . Leukemia Brother   . Cancer Brother   . Liver disease Brother   . Alcohol abuse Brother   . Throat cancer Brother   . Heart disease Brother   . Diabetes Brother      Prior to Admission medications   Medication Sig Start Date End Date Taking? Authorizing Provider  acetaminophen (TYLENOL) 500 MG tablet Take 500 mg by mouth every 6 (six) hours as needed for mild pain, moderate pain or  headache.    [provider]  aspirin EC 81 MG tablet Take 81 mg by mouth daily.    [provider]  atorvastatin (LIPITOR) 20 MG tablet Take 1 tablet (20 mg total) by mouth daily. 07/25/17   Steele Sizer, MD  desloratadine (CLARINEX) 5 MG tablet Take 1 tablet (5 mg total) by mouth daily. 07/25/17   Steele Sizer, MD  ergocalciferol (VITAMIN D2) 50000 units capsule Take 1 capsule (50,000 Units total) by mouth once a week. 07/18/17   Steele Sizer, MD  escitalopram (LEXAPRO) 5 MG tablet Take 1 tablet (5 mg total) by mouth daily. 09/02/17   Steele Sizer, MD  fluticasone (FLONASE) 50 MCG/ACT nasal spray Place 2 sprays into both nostrils daily. 05/04/16   Roselee Nova, MD  IRON PO Take 325 mg by mouth daily.    [provider]  metFORMIN (GLUCOPHAGE) 1000 MG tablet TAKE 1/2 TABLETS (500 MG TOTAL) BY MOUTH 2 (TWO) TIMES DAILY WITH A MEAL. 07/25/17   Sowles, Drue Stager, MD  mometasone (ELOCON) 0.1 % ointment Apply topically daily. 10/22/16   Roselee Nova, MD  Multiple Vitamin (MULTI-VITAMINS) TABS Take by mouth.    [provider]  ranitidine (ZANTAC) 150 MG tablet Take 1 tablet (150 mg total) by mouth 2 (two) times daily as needed for heartburn. 07/25/17   Steele Sizer, MD  triamcinolone ointment (KENALOG) 0.5 % Apply 1 application topically 2 (two) times daily. 03/16/17   Roselee Nova, MD    Physical Exam: Vitals:   09/03/17 2230 09/03/17 2245 09/03/17 2300 09/03/17 2315  BP: 108/84 104/67 108/62 (!) 105/59  Pulse: 65     Resp:      Temp:      TempSrc:      SpO2: 99%     Weight:      Height:          Constitutional: NAD, calm  Eyes: PERTLA, lids and conjunctivae normal ENMT: Mucous membranes are moist. Posterior pharynx clear of any exudate or lesions.   Neck: normal, supple, no masses, no thyromegaly Respiratory: clear to auscultation bilaterally, no wheezing, no crackles. Normal respiratory effort.   Cardiovascular: S1 & S2 heard,  regular rate and rhythm. No extremity edema.   Abdomen: No distension, soft, non-tender. Bowel sounds active.  Musculoskeletal: no clubbing / cyanosis. No joint deformity upper and lower extremities.  Skin: no significant rashes, lesions, ulcers. Warm, dry, well-perfused. Neurologic: CN 2-12 grossly intact. Sensation intact. Strength 5/5 in all 4 limbs.  Psychiatric: Alert and oriented x 3. Pleasant and cooperative.     Labs on Admission: I have personally reviewed following labs and imaging studies  CBC: Recent Labs  Lab 09/03/17 2250  WBC 10.0  NEUTROABS 7.2  HGB 13.6  HCT 42.8  MCV 89.9  PLT 852   Basic Metabolic Panel:  Recent Labs  Lab 09/03/17 2250  NA 137  K 3.9  CL 102  CO2 25  GLUCOSE 144*  BUN 5*  CREATININE 0.70  CALCIUM 8.9   GFR: Estimated Creatinine Clearance: 69.8 mL/min (by C-G formula based on SCr of 0.7 mg/dL). Liver Function Tests: Recent Labs  Lab 09/03/17 2250  AST 22  ALT 22  ALKPHOS 66  BILITOT 0.7  PROT 7.0  ALBUMIN 3.9   Recent Labs  Lab 09/03/17 2250  LIPASE 42   No results for input(s): AMMONIA in the last 168 hours. Coagulation Profile: No results for input(s): INR, PROTIME in the last 168 hours. Cardiac Enzymes: No results for input(s): CKTOTAL, CKMB, CKMBINDEX, TROPONINI in the last 168 hours. BNP (last 3 results) No results for input(s): PROBNP in the last 8760 hours. HbA1C: No results for input(s): HGBA1C in the last 72 hours. CBG: No results for input(s): GLUCAP in the last 168 hours. Lipid Profile: No results for input(s): CHOL, HDL, LDLCALC, TRIG, CHOLHDL, LDLDIRECT in the last 72 hours. Thyroid Function Tests: No results for input(s): TSH, T4TOTAL, FREET4, T3FREE, THYROIDAB in the last 72 hours. Anemia Panel: No results for input(s): VITAMINB12, FOLATE, FERRITIN, TIBC, IRON, RETICCTPCT in the last 72 hours. Urine analysis:    Component Value Date/Time   COLORURINE YELLOW 09/03/2017 2254   APPEARANCEUR CLEAR  09/03/2017 2254   APPEARANCEUR Clear 02/01/2014 0016   LABSPEC 1.005 09/03/2017 2254   LABSPEC 1.014 02/01/2014 0016   PHURINE 8.0 09/03/2017 2254   GLUCOSEU NEGATIVE 09/03/2017 2254   GLUCOSEU Negative 02/01/2014 0016   HGBUR SMALL (A) 09/03/2017 2254   BILIRUBINUR NEGATIVE 09/03/2017 2254   BILIRUBINUR Negative 02/01/2014 0016   KETONESUR NEGATIVE 09/03/2017 2254   PROTEINUR NEGATIVE 09/03/2017 2254   NITRITE NEGATIVE 09/03/2017 2254   LEUKOCYTESUR MODERATE (A) 09/03/2017 2254   LEUKOCYTESUR Negative 02/01/2014 0016   Sepsis Labs: @LABRCNTIP (procalcitonin:4,lacticidven:4) )No results found for this or any previous visit (from the past 240 hour(s)).   Radiological Exams on Admission: No results found.  EKG: Not performed.   Assessment/Plan   1. Fecal impaction  - Presents with constipation and rectal discomfort concerning for recurrent fecal impaction, previously requiring instrumentation under general anesthesia  - Bowel sounds active and she continues to pass flatus  - Multiple attempts at manual disimpaction unsuccessful in ED  - Check KUB, give mineral oil enema, may need to involve GI if fails to respond to conservative management    2. Type II DM  - A1c 6.3% last month  - Managed with metformin only at home, held on admission  - Follow CBG's and use a SSI with Novolog while in hospital    3. Depression  - Stable, continue Lexapro     DVT prophylaxis: SCD's  Code Status: Full  Family Communication: Discussed with patient  Consults called: None Admission status: Observation     Vianne Bulls, MD [facility] Pager [phone]  If 7PM-7AM, please contact night-coverage [email or web address] Password [email or web address]  09/04/2017, 5:42 AM

## 2017-09-04 NOTE — ED Notes (Signed)
Pt able to have watery brown bowel movement without significant relief.

## 2017-09-04 NOTE — Progress Notes (Signed)
Patient refuses the moviprep.  MD notified.

## 2017-09-04 NOTE — ED Notes (Signed)
Admitting MD at bedside.

## 2017-09-04 NOTE — ED Provider Notes (Signed)
Patient care signed out at end of shift by Carmon Sails, PA-C.  Here with fecal impaction, could not tolerate manual disimpaction Now providing soap suds enema, MOM Has tried metamucil, stool softener, mineral oil enema without relief.  CT not favored due to allergies No belly tenderness, vomiting, fever. She is passing gas. Do not feel she is obstructed.   Recheck after soap suds enema and MOM. She passed a small amount of fluid without significant stool. Discussed trying to reattempt manual impaction or another enema (Milk and [place]) and magnesium citrate. She opts to retry disimpaction.   Attempt at disimpaction is unsuccessful due to pain. Will admit for bowel clean out. Hospitalist paged for admission.   Dr. Myna Hidalgo accepts the patient onto his service. Very much appreciate his help with her care.   Charlann Lange, PA-C 09/04/17 2761    Virgel Manifold, MD 09/04/17 Valerie Roys

## 2017-09-04 NOTE — ED Notes (Signed)
Left message for report

## 2017-09-04 NOTE — Progress Notes (Signed)
[id number] Received pt. Alert and oriented. Complains of abdominal discomfort. Oriented pt to   room.  0655 Pt down to xray

## 2017-09-04 NOTE — Consult Note (Addendum)
Consultation  Referring Provider: Dr. Maryland Pink     Primary Care Physician:  Steele Sizer, MD Primary Gastroenterologist: Dr. Allen Norris   Reason for Consultation: Constipation/Obstipation, rectal pain              Impression / Plan:   Impression: 1. Constipation with fecal impaction: Fiber supplements , mineral oil enema, stool softeners at home, soapsuds enema and milk of magnesia here, unable to relieve obstruction, patient now too uncomfortable to endure any further manual disimpaction/enemas 2. Rectal pain: With all of above  Plan: 1.  Ordered movi prep to be given all at once today.  Reglan 10 mg IV to be given at time of prep. 2.  If above is unsuccessful, will likely need to consult general surgery for an exam under anesthesia/disimpaction. 3.  Continue other supportive measures 4.  Please await any further recommendations from Dr. Carlean Purl later today.  Thank you for your kind consultation, we will continue to follow.  Brooke Liu  09/04/2017, 12:13 PM Pager #: [phone]    [facility] Attending   I have taken an interval history, reviewed the chart and examined the patient. I agree with the Advanced Practitioner's note, impression and recommendations.   We will see if taking a colon purge relieves this She will not allow further attempts at disimpaction and before we sedate/anesthetize her would do thius. Xray not showing sig bowel dilation and is diffusely FOS and in rectum.  If that fails suspect she needs help fromn surgery - unless a therapeutic gastrograffen enema could be tried (if she would allow)  Gatha Mayer, MD, [facility] 09/04/2017 1:15 PM      HPI:   Brooke Liu is a 67 y.o. female with past medical history of depression, type 2 diabetes and history of fecal impaction requiring instrumentation under general anesthesia, who presented to the ER on 09/04/2017 with a complaint of constipation and rectal pain.   Today, explains she has not had a normal bowel movement since 08/29/2017.  Explains that prior to this for about 2-1/2 weeks she has had some generalized abdominal discomfort and given her previous gastric bypass went to a diet of protein shakes and baby food only.  She then got really hungry a couple of days ago and ate a bunch of food and also took all of her minerals/vitamins which resulted in this feeling starting on 09/02/2017, as though she needed a bowel movement but just could not go.  She tried an enema at home as well as disimpaction at home but could not relieve it.  Proceeded to the ER as above.  Continues with a generalized abdominal discomfort as well as nausea.  Tells me her rectal pain is so severe now that she cannot tolerate any more enemas or manual disimpaction.  Patient is able to take in liquids but does feel somewhat nauseous.    History of similar about 30 years ago per patient which sounds as though it required anesthesia and manual disimpaction.    Denies fever or chills.  ED Course:  CMP and CBC unremarkable, multiple attempts at manual disimpaction were unsuccessful, treated with milk of magnesia and soapsuds enema without relief   GI history: With Dr. Allen Norris  Past Medical History:  Diagnosis Date  . Anemia   . Anxiety   . 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 Depression   . Diabetes mellitus, type II ([place])   . Headache    stress/sinus  .  Hemorrhoids   . Motion sickness   . Multiple thyroid nodules   . Sleep apnea    past hx.  resolved with wt loss  . Vertigo    sinus related    Past Surgical History:  Procedure Laterality Date  . ABDOMINAL HYSTERECTOMY    . BACK SURGERY  2010   lumbar  . CESAREAN SECTION    . CHOLECYSTECTOMY    . COLONOSCOPY WITH PROPOFOL N/A 11/21/2015   Procedure: COLONOSCOPY WITH PROPOFOL;  Surgeon: Lucilla Lame, MD;  Location: [facility];  Service: Endoscopy;  Laterality: N/A;  . ESOPHAGEAL MANOMETRY N/A 12/22/2016   Procedure:  ESOPHAGEAL MANOMETRY (EM);  Surgeon: Mauri Pole, MD;  [facility];  Service: Endoscopy;  Laterality: N/A;  . ESOPHAGOGASTRODUODENOSCOPY (EGD) WITH PROPOFOL N/A 11/21/2015   Procedure: ESOPHAGOGASTRODUODENOSCOPY (EGD) WITH PROPOFOL;  Surgeon: Lucilla Lame, MD;  Location: [place];  Service: Endoscopy;  Laterality: N/A;  Diabetic - oral meds  . GASTRIC BYPASS  2000  . HERNIA REPAIR    . SMALL BOWEL REPAIR      Family History  Problem Relation Age of Onset  . Depression Mother   . Diabetes Mother   . Heart disease Mother   . Heart disease Father   . Diabetes Sister   . Diabetes Brother   . Dementia Brother   . Heart disease Brother   . Diabetes Sister   . Fibromyalgia Sister   . Diabetes Brother   . Heart disease Brother   . Diabetes Brother   . Heart disease Brother   . Leukemia Brother   . Cancer Brother   . Liver disease Brother   . Alcohol abuse Brother   . Throat cancer Brother   . Heart disease Brother   . Diabetes Brother      Social History   Tobacco Use  . Smoking status: Former Smoker    Years: 15.00    Types: Cigarettes    Start date: 03/08/1962    Last attempt to quit: 09/24/1977    Years since quitting: 39.9  . Smokeless tobacco: Never Used  Substance Use Topics  . Alcohol use: No    Alcohol/week: 0.0 oz  . Drug use: No    Prior to Admission medications   Medication Sig Start Date End Date Taking? Authorizing Provider  acetaminophen (TYLENOL) 500 MG tablet Take 500 mg by mouth every 6 (six) hours as needed for mild pain, moderate pain or headache.   Yes [provider]  atorvastatin (LIPITOR) 20 MG tablet Take 1 tablet (20 mg total) by mouth daily. 07/25/17  Yes Sowles, Drue Stager, MD  COCONUT OIL PO Take 1 capsule by mouth daily.   Yes [provider]  desloratadine (CLARINEX) 5 MG tablet Take 1 tablet (5 mg total) by mouth daily. Patient taking differently: Take 5 mg by mouth daily as needed (for allergies).   07/25/17  Yes Sowles, Drue Stager, MD  ergocalciferol (VITAMIN D2) 50000 units capsule Take 1 capsule (50,000 Units total) by mouth once a week. Patient taking differently: Take 50,000 Units by mouth every Saturday.  07/18/17  Yes Sowles, Drue Stager, MD  escitalopram (LEXAPRO) 5 MG tablet Take 1 tablet (5 mg total) by mouth daily. 09/02/17  Yes Sowles, Drue Stager, MD  fluticasone (FLONASE) 50 MCG/ACT nasal spray Place 2 sprays into both nostrils daily. Patient taking differently: Place 2 sprays into both nostrils daily as needed for allergies or rhinitis.  05/04/16  Yes Roselee Nova, MD  Ginkgo  Biloba (GINKOBA PO) Take 1 tablet by mouth daily.   Yes [provider]  metFORMIN (GLUCOPHAGE) 1000 MG tablet TAKE 1/2 TABLETS (500 MG TOTAL) BY MOUTH 2 (TWO) TIMES DAILY WITH A MEAL. 07/25/17  Yes Sowles, Drue Stager, MD  mometasone (ELOCON) 0.1 % ointment Apply topically daily. Patient taking differently: Apply 1 application topically daily as needed (for stress eczema).  10/22/16  Yes Roselee Nova, MD  Multiple Vitamins-Minerals (BARIATRIC FUSION) CHEW Chew 1 tablet by mouth 4 (four) times daily.   Yes [provider]  triamcinolone ointment (KENALOG) 0.5 % Apply 1 application topically 2 (two) times daily. Patient taking differently: Apply 1 application topically 2 (two) times daily as needed (for stress eczema).  03/16/17  Yes Roselee Nova, MD  ranitidine (ZANTAC) 150 MG tablet Take 1 tablet (150 mg total) by mouth 2 (two) times daily as needed for heartburn. 07/25/17   Steele Sizer, MD    Current Facility-Administered Medications  Medication Dose Route Frequency Provider Last Rate Last Dose  . 0.9 %  sodium chloride infusion   Intravenous Continuous Opyd, Ilene Qua, MD 100 mL/hr at 09/04/17 0649    . acetaminophen (TYLENOL) tablet 650 mg  650 mg Oral Q6H PRN Opyd, Ilene Qua, MD       Or  . acetaminophen (TYLENOL) suppository 650 mg  650 mg Rectal Q6H PRN Opyd, Ilene Qua, MD      .  atorvastatin (LIPITOR) tablet 20 mg  20 mg Oral Daily Opyd, Ilene Qua, MD   20 mg at 09/04/17 0949  . escitalopram (LEXAPRO) tablet 5 mg  5 mg Oral Daily Opyd, Ilene Qua, MD   5 mg at 09/04/17 0949  . fentaNYL (SUBLIMAZE) injection 25-50 mcg  25-50 mcg Intravenous Q2H PRN Opyd, Ilene Qua, MD      . insulin aspart (novoLOG) injection 0-9 Units  0-9 Units Subcutaneous Q4H Opyd, Ilene Qua, MD      . mineral oil enema 1 enema  1 enema Rectal Once Opyd, Ilene Qua, MD      . ondansetron (ZOFRAN) tablet 4 mg  4 mg Oral Q6H PRN Opyd, Ilene Qua, MD       Or  . ondansetron (ZOFRAN) injection 4 mg  4 mg Intravenous Q6H PRN Opyd, Ilene Qua, MD        Allergies as of 09/03/2017 - Review Complete 09/03/2017  Allergen Reaction Noted  . Oxycodone-acetaminophen Hives 09/16/2015  . Percocet [oxycodone-acetaminophen] Hives 02/14/2015  . Iodinated diagnostic agents Itching 09/25/2014     Review of Systems:    Constitutional: No weight loss, fever or chills Skin: No rash  Cardiovascular: No chest pain  Respiratory: No SOB  Gastrointestinal: See HPI and otherwise negative Genitourinary: No dysuria Neurological: No headache, dizziness or syncope Musculoskeletal: No new muscle or joint pain Hematologic: No bleeding Psychiatric: No history of depression or anxiety   Physical Exam:  Vital signs in last 24 hours: Temp:  [97.6 F (36.4 C)-97.8 F (36.6 C)] 97.6 F (36.4 C) (06/30 0645) Pulse Rate:  [61-84] 61 (06/30 0645) Resp:  [15-18] 18 (06/30 0645) BP: (104-197)/(59-100) 156/79 (06/30 0645) SpO2:  [97 %-100 %] 99 % (06/30 0645) Weight:  [182 lb (82.6 kg)] 182 lb (82.6 kg) (06/29 2050)   General:   Pleasant Caucasian female appears to be in uncomfortable, NAD, Well developed, Well nourished, alert and cooperative Head:  Normocephalic and atraumatic. Eyes:   PEERL, EOMI. No icterus. Conjunctiva pink. Ears:  Normal auditory acuity. Neck:  Supple Throat: Oral cavity and pharynx without  inflammation, swelling or lesion.  Lungs: Respirations even and unlabored. Lungs clear to auscultation bilaterally.   No wheezes, crackles, or rhonchi.  Heart: Normal S1, S2. No MRG. Regular rate and rhythm. No peripheral edema, cyanosis or pallor.  Abdomen:  Soft, mild distension,mild generalized ttp. No rebound or guarding. Normal bowel sounds. No appreciable masses or hepatomegaly. Rectal:  Externa;: inflamed external hemorrhoid, generalized erythema and irritation; Internal-Not performed declined by patient.  Msk:  Symmetrical without gross deformities. Peripheral pulses intact.  Extremities:  Without edema, no deformity or joint abnormality. Normal ROM, normal sensation. Neurologic:  Alert and  oriented x4;  grossly normal neurologically.  Skin:   Dry and intact without significant lesions or rashes. Psychiatric: Demonstrates good judgement and reason without abnormal affect or behaviors.   LAB RESULTS: Recent Labs    09/03/17 2250  WBC 10.0  HGB 13.6  HCT 42.8  PLT 258   BMET Recent Labs    09/03/17 2250  NA 137  K 3.9  CL 102  CO2 25  GLUCOSE 144*  BUN 5*  CREATININE 0.70  CALCIUM 8.9   LFT Recent Labs    09/03/17 2250  PROT 7.0  ALBUMIN 3.9  AST 22  ALT 22  ALKPHOS 66  BILITOT 0.7   STUDIES: Abd 1 View (kub)  Result Date: 09/04/2017 CLINICAL DATA:  Fecal impaction EXAM: ABDOMEN - 1 VIEW COMPARISON:  None. FINDINGS: Large amount of stool within the rectum. There is stool throughout the remainder of the colon. Prominent loops of small bowel demonstrated throughout the abdomen. Lung bases are clear. Surgical clips within the right hemiabdomen. Postsurgical change central lower abdomen. Lumbar spine degenerative changes. IMPRESSION: Large amount of stool within the rectum. Additionally there is a large amount of stool within the cecum and ascending colon compatible with associated constipation. Prominent gaseous distended loops of small bowel. Electronically Signed    By: Lovey Newcomer M.D.   On: 09/04/2017 07:30

## 2017-09-05 ENCOUNTER — Observation Stay (HOSPITAL_COMMUNITY): Payer: Medicare Other | Admitting: Certified Registered"

## 2017-09-05 ENCOUNTER — Encounter (HOSPITAL_COMMUNITY): Payer: Self-pay | Admitting: Orthopedic Surgery

## 2017-09-05 ENCOUNTER — Encounter (HOSPITAL_COMMUNITY)
Admission: EM | Disposition: A | Discharge status: Home / Self Care | Payer: Self-pay | Source: Home / Self Care | Attending: Internal Medicine

## 2017-09-05 DIAGNOSIS — Z87891 Personal history of nicotine dependence: Secondary | ICD-10-CM | POA: Diagnosis not present

## 2017-09-05 DIAGNOSIS — Z8542 Personal history of malignant neoplasm of other parts of uterus: Secondary | ICD-10-CM | POA: Diagnosis not present

## 2017-09-05 DIAGNOSIS — Z79899 Other long term (current) drug therapy: Secondary | ICD-10-CM | POA: Diagnosis not present

## 2017-09-05 DIAGNOSIS — E669 Obesity, unspecified: Secondary | ICD-10-CM | POA: Diagnosis present

## 2017-09-05 DIAGNOSIS — K644 Residual hemorrhoidal skin tags: Secondary | ICD-10-CM | POA: Diagnosis present

## 2017-09-05 DIAGNOSIS — Z91041 Radiographic dye allergy status: Secondary | ICD-10-CM | POA: Diagnosis not present

## 2017-09-05 DIAGNOSIS — I10 Essential (primary) hypertension: Secondary | ICD-10-CM | POA: Diagnosis present

## 2017-09-05 DIAGNOSIS — E042 Nontoxic multinodular goiter: Secondary | ICD-10-CM | POA: Diagnosis present

## 2017-09-05 DIAGNOSIS — R32 Unspecified urinary incontinence: Secondary | ICD-10-CM | POA: Diagnosis present

## 2017-09-05 DIAGNOSIS — Z9884 Bariatric surgery status: Secondary | ICD-10-CM | POA: Diagnosis not present

## 2017-09-05 DIAGNOSIS — F3341 Major depressive disorder, recurrent, in partial remission: Secondary | ICD-10-CM | POA: Diagnosis present

## 2017-09-05 DIAGNOSIS — F419 Anxiety disorder, unspecified: Secondary | ICD-10-CM | POA: Diagnosis present

## 2017-09-05 DIAGNOSIS — K5641 Fecal impaction: Secondary | ICD-10-CM | POA: Diagnosis present

## 2017-09-05 DIAGNOSIS — Z6833 Body mass index (BMI) 33.0-33.9, adult: Secondary | ICD-10-CM | POA: Diagnosis not present

## 2017-09-05 DIAGNOSIS — D649 Anemia, unspecified: Secondary | ICD-10-CM | POA: Diagnosis present

## 2017-09-05 DIAGNOSIS — G4730 Sleep apnea, unspecified: Secondary | ICD-10-CM | POA: Diagnosis present

## 2017-09-05 DIAGNOSIS — E119 Type 2 diabetes mellitus without complications: Secondary | ICD-10-CM | POA: Diagnosis present

## 2017-09-05 DIAGNOSIS — Z818 Family history of other mental and behavioral disorders: Secondary | ICD-10-CM | POA: Diagnosis not present

## 2017-09-05 DIAGNOSIS — Z7982 Long term (current) use of aspirin: Secondary | ICD-10-CM | POA: Diagnosis not present

## 2017-09-05 DIAGNOSIS — R159 Full incontinence of feces: Secondary | ICD-10-CM | POA: Diagnosis not present

## 2017-09-05 DIAGNOSIS — Z833 Family history of diabetes mellitus: Secondary | ICD-10-CM | POA: Diagnosis not present

## 2017-09-05 DIAGNOSIS — Z7984 Long term (current) use of oral hypoglycemic drugs: Secondary | ICD-10-CM | POA: Diagnosis not present

## 2017-09-05 DIAGNOSIS — I739 Peripheral vascular disease, unspecified: Secondary | ICD-10-CM | POA: Diagnosis present

## 2017-09-05 DIAGNOSIS — Z885 Allergy status to narcotic agent status: Secondary | ICD-10-CM | POA: Diagnosis not present

## 2017-09-05 HISTORY — PX: INCISION AND DRAINAGE PERIRECTAL ABSCESS: SHX1804

## 2017-09-05 HISTORY — PX: FECAL DISIMPACTION: SHX1579

## 2017-09-05 LAB — SURGICAL PCR SCREEN
MRSA, PCR: NEGATIVE
Staphylococcus aureus: POSITIVE — AB

## 2017-09-05 LAB — URINE CULTURE: Culture: NO GROWTH

## 2017-09-05 LAB — GLUCOSE, CAPILLARY
GLUCOSE-CAPILLARY: 101 mg/dL — AB (ref 70–99)
GLUCOSE-CAPILLARY: 106 mg/dL — AB (ref 70–99)
GLUCOSE-CAPILLARY: 107 mg/dL — AB (ref 70–99)
GLUCOSE-CAPILLARY: 110 mg/dL — AB (ref 70–99)
GLUCOSE-CAPILLARY: 75 mg/dL (ref 70–99)
Glucose-Capillary: 105 mg/dL — ABNORMAL HIGH (ref 70–99)
Glucose-Capillary: 227 mg/dL — ABNORMAL HIGH (ref 70–99)
Glucose-Capillary: 174 mg/dL — ABNORMAL HIGH (ref 70–99)

## 2017-09-05 SURGERY — INCISION AND DRAINAGE, ABSCESS, PERIRECTAL
Anesthesia: General | Site: Rectum

## 2017-09-05 MED ORDER — BUPIVACAINE-EPINEPHRINE (PF) 0.25% -1:200000 IJ SOLN
INTRAMUSCULAR | Status: AC
  Filled 2017-09-05: qty 30

## 2017-09-05 MED ORDER — LIDOCAINE HCL 1 % IJ SOLN
INTRAMUSCULAR | Status: DC | PRN
  Administered 2017-09-05: 12:00:00 via INTRAMUSCULAR

## 2017-09-05 MED ORDER — SUCCINYLCHOLINE CHLORIDE 20 MG/ML IJ SOLN
INTRAMUSCULAR | Status: DC | PRN
  Administered 2017-09-05: 140 mg via INTRAVENOUS

## 2017-09-05 MED ORDER — DIPHENHYDRAMINE HCL 50 MG/ML IJ SOLN
25.0000 mg | Freq: Once | INTRAMUSCULAR | Status: AC
  Administered 2017-09-05: 25 mg via INTRAVENOUS
  Filled 2017-09-05: qty 1

## 2017-09-05 MED ORDER — PROPOFOL 10 MG/ML IV BOLUS
INTRAVENOUS | Status: AC
  Filled 2017-09-05: qty 20

## 2017-09-05 MED ORDER — LIDOCAINE 2% (20 MG/ML) 5 ML SYRINGE
INTRAMUSCULAR | Status: DC | PRN
  Administered 2017-09-05: 80 mg via INTRAVENOUS

## 2017-09-05 MED ORDER — MUPIROCIN 2 % EX OINT
1.0000 "application " | TOPICAL_OINTMENT | Freq: Two times a day (BID) | CUTANEOUS | Status: DC
  Administered 2017-09-05 – 2017-09-08 (×6): 1 via NASAL
  Filled 2017-09-05: qty 22

## 2017-09-05 MED ORDER — FENTANYL CITRATE (PF) 250 MCG/5ML IJ SOLN
INTRAMUSCULAR | Status: AC
  Filled 2017-09-05: qty 5

## 2017-09-05 MED ORDER — LIDOCAINE HCL 1 % IJ SOLN
INTRAMUSCULAR | Status: AC
  Filled 2017-09-05: qty 20

## 2017-09-05 MED ORDER — CHLORHEXIDINE GLUCONATE CLOTH 2 % EX PADS
6.0000 | MEDICATED_PAD | Freq: Every day | CUTANEOUS | Status: DC
  Administered 2017-09-06: 6 via TOPICAL

## 2017-09-05 MED ORDER — MIDAZOLAM HCL 5 MG/5ML IJ SOLN
INTRAMUSCULAR | Status: DC | PRN
  Administered 2017-09-05 (×2): 2 mg via INTRAVENOUS

## 2017-09-05 MED ORDER — MIDAZOLAM HCL 2 MG/2ML IJ SOLN
INTRAMUSCULAR | Status: AC
  Filled 2017-09-05: qty 2

## 2017-09-05 MED ORDER — PEG 3350-KCL-NABCB-NACL-NASULF 236 G PO SOLR
4000.0000 mL | Freq: Once | ORAL | Status: AC
  Administered 2017-09-05: 4000 mL via ORAL
  Filled 2017-09-05: qty 4000

## 2017-09-05 MED ORDER — 0.9 % SODIUM CHLORIDE (POUR BTL) OPTIME
TOPICAL | Status: DC | PRN
  Administered 2017-09-05: 1000 mL

## 2017-09-05 MED ORDER — PROPOFOL 10 MG/ML IV BOLUS
INTRAVENOUS | Status: DC | PRN
  Administered 2017-09-05: 50 mg via INTRAVENOUS
  Administered 2017-09-05: 200 mg via INTRAVENOUS

## 2017-09-05 MED ORDER — FENTANYL CITRATE (PF) 100 MCG/2ML IJ SOLN: 25.0000 ug | INTRAMUSCULAR | Status: DC | PRN

## 2017-09-05 MED ORDER — LACTATED RINGERS IV SOLN
INTRAVENOUS | Status: DC
  Administered 2017-09-05 (×2): via INTRAVENOUS

## 2017-09-05 MED ORDER — CHLORHEXIDINE GLUCONATE CLOTH 2 % EX PADS: 6.0000 | MEDICATED_PAD | Freq: Once | CUTANEOUS | Status: DC

## 2017-09-05 MED ORDER — HYDROCORTISONE 2.5 % RE CREA
TOPICAL_CREAM | RECTAL | Status: AC
  Administered 2017-09-05: 17:00:00 via TOPICAL
  Filled 2017-09-05 (×2): qty 28.35

## 2017-09-05 SURGICAL SUPPLY — 38 items
BRIEF STRETCH FOR OB PAD LRG (UNDERPADS AND DIAPERS) IMPLANT
CANISTER SUCT 3000ML PPV (MISCELLANEOUS) ×3 IMPLANT
COVER SURGICAL LIGHT HANDLE (MISCELLANEOUS) ×3 IMPLANT
DRSG PAD ABDOMINAL 8X10 ST (GAUZE/BANDAGES/DRESSINGS) IMPLANT
ELECT CAUTERY BLADE 6.4 (BLADE) ×3 IMPLANT
ELECT REM PT RETURN 9FT ADLT (ELECTROSURGICAL)
ELECTRODE REM PT RTRN 9FT ADLT (ELECTROSURGICAL) IMPLANT
GAUZE PACKING IODOFORM 1X5 (MISCELLANEOUS) IMPLANT
GAUZE SPONGE 4X4 12PLY STRL (GAUZE/BANDAGES/DRESSINGS) ×3 IMPLANT
GLOVE BIO SURGEON STRL SZ7 (GLOVE) ×9 IMPLANT
GLOVE BIOGEL PI IND STRL 6.5 (GLOVE) ×1 IMPLANT
GLOVE BIOGEL PI IND STRL 7.0 (GLOVE) ×1 IMPLANT
GLOVE BIOGEL PI IND STRL 7.5 (GLOVE) ×1 IMPLANT
GLOVE BIOGEL PI INDICATOR 6.5 (GLOVE) ×2
GLOVE BIOGEL PI INDICATOR 7.0 (GLOVE) ×2
GLOVE BIOGEL PI INDICATOR 7.5 (GLOVE) ×2
GOWN STRL REUS W/ TWL LRG LVL3 (GOWN DISPOSABLE) ×2 IMPLANT
GOWN STRL REUS W/TWL LRG LVL3 (GOWN DISPOSABLE) ×4
KIT BASIN OR (CUSTOM PROCEDURE TRAY) ×9 IMPLANT
KIT TURNOVER KIT B (KITS) ×3 IMPLANT
NEEDLE HYPO 25GX1X1/2 BEV (NEEDLE) ×3 IMPLANT
NS IRRIG 1000ML POUR BTL (IV SOLUTION) ×3 IMPLANT
PACK LITHOTOMY IV (CUSTOM PROCEDURE TRAY) ×3 IMPLANT
PACK SURGICAL SETUP 50X90 (CUSTOM PROCEDURE TRAY) IMPLANT
PAD ARMBOARD 7.5X6 YLW CONV (MISCELLANEOUS) ×3 IMPLANT
PENCIL BUTTON HOLSTER BLD 10FT (ELECTRODE) ×3 IMPLANT
SOLUTION BETADINE 4OZ (MISCELLANEOUS) ×3 IMPLANT
SPONGE LAP 18X18 X RAY DECT (DISPOSABLE) ×3 IMPLANT
SURGILUBE 2OZ TUBE FLIPTOP (MISCELLANEOUS) IMPLANT
SWAB COLLECTION DEVICE MRSA (MISCELLANEOUS) IMPLANT
SWAB CULTURE ESWAB REG 1ML (MISCELLANEOUS) IMPLANT
SYR CONTROL 10ML LL (SYRINGE) ×3 IMPLANT
TOWEL OR 17X24 6PK STRL BLUE (TOWEL DISPOSABLE) IMPLANT
TOWEL OR 17X26 10 PK STRL BLUE (TOWEL DISPOSABLE) ×3 IMPLANT
TUBE CONNECTING 12'X1/4 (SUCTIONS) ×1
TUBE CONNECTING 12X1/4 (SUCTIONS) ×2 IMPLANT
UNDERPAD 30X30 (UNDERPADS AND DIAPERS) ×3 IMPLANT
YANKAUER SUCT BULB TIP NO VENT (SUCTIONS) ×3 IMPLANT

## 2017-09-05 NOTE — Anesthesia Preprocedure Evaluation (Signed)
Anesthesia Evaluation  Patient identified by MRN, date of birth, ID band Patient awake    Reviewed: Allergy & Precautions, NPO status , Patient's Chart, lab work & pertinent test results  Airway Mallampati: II  TM Distance: >3 FB     Dental   Pulmonary sleep apnea , former smoker,    breath sounds clear to auscultation       Cardiovascular hypertension, + Peripheral Vascular Disease   Rhythm:Regular Rate:Normal     Neuro/Psych    GI/Hepatic negative GI ROS, Neg liver ROS,   Endo/Other  diabetes  Renal/GU negative Renal ROS     Musculoskeletal   Abdominal   Peds  Hematology  (+) anemia ,   Anesthesia Other Findings   Reproductive/Obstetrics                             Anesthesia Physical Anesthesia Plan  ASA: III  Anesthesia Plan: General   Post-op Pain Management:    Induction: Intravenous, Rapid sequence and Cricoid pressure planned  PONV Risk Score and Plan: 3 and Treatment may vary due to age or medical condition, Ondansetron, Dexamethasone and Midazolam  Airway Management Planned: Oral ETT  Additional Equipment:   Intra-op Plan:   Post-operative Plan: Possible Post-op intubation/ventilation  Informed Consent: I have reviewed the patients History and Physical, chart, labs and discussed the procedure including the risks, benefits and alternatives for the proposed anesthesia with the patient or authorized representative who has indicated his/her understanding and acceptance.   Dental advisory given  Plan Discussed with:   Anesthesia Plan Comments:         Anesthesia Quick Evaluation

## 2017-09-05 NOTE — Transfer of Care (Signed)
Immediate Anesthesia Transfer of Care Note  Patient: Brooke Liu  Procedure(s) Performed: FECAL DISIMPACTION (N/A Rectum)  Patient Location: PACU  Anesthesia Type:General  Level of Consciousness: awake, alert , oriented and patient cooperative  Airway & Oxygen Therapy: Patient Spontanous Breathing and Patient connected to face mask oxygen  Post-op Assessment: Report given to RN and Post -op Vital signs reviewed and stable  Post vital signs: Reviewed and stable  Last Vitals:  Vitals Value Taken Time  BP 150/81 09/05/2017 12:28 PM  Temp    Pulse 81 09/05/2017 12:37 PM  Resp 19 09/05/2017 12:37 PM  SpO2 96 % 09/05/2017 12:37 PM  Vitals shown include unvalidated device data.  Last Pain:  Vitals:   09/05/17 1017  TempSrc: Oral  PainSc:          Complications: No apparent anesthesia complications

## 2017-09-05 NOTE — Op Note (Signed)
Preoperative diagnosis: fecal impaction Postoperative diagnosis: same as above Procedure: manual disimpaction of rectum, anoscopy Surgeon: Dr Serita Grammes Asst: Melina Modena, PA-C Anesthesia general with anal block EBL none Complications none Specimens none Drains none Sponge count correct dispo to recovery stable  Indications: this is a 81 yof with no bm for 3 weeks.  She has impaction and is not tolerating other therapy. Discussed doing manual disimpaction under anesthesia  Procedure: After informed consent obtained patient taken to the OR.  She was placed under anesthesia without complication.   She was prepped and draped.  Timeout was performed  I performed an anal block with mixture of 1% lidocaine and .25% marcaine.  We then manually disimpacted a large amount of stool.  We did this as far as we could reach.  I inserted an anoscope and there was no more stool. She has some irritated hemorrhoids and there was just liquid stool from above now.  She tolerated well and was transferred to recovery stable. She needs prep per gi at this point. Please call back if needed

## 2017-09-05 NOTE — Progress Notes (Signed)
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  Patient has had some issues with constipation, but usually keeps herself regular by drinking several cups of coffee.  She feels like her problem started with a combination of her switching over to mostly baby food as of late, restarting her mineral vitamins and less coffee.  She tried multiple medication to try to make her move her bowels, but this is lead to abdominal cramping but no actual bowel movements.  She is been unable to place an enema because her rectum has been so swollen and inflamed.  She presented to the emergency room and no response to milk of magnesia or soapsuds enema.  Patient underwent abdominal x-ray which noted significant amount of stool throughout the colon and a large amount of stool in the rectal area.  Seen by GI on 6/30 who recommended GoLYTELY prep.  Patient refused this as previous prep and enemas did not work and only cause cramping.  Surgery consulted who took patient to the OR And with anesthesia, patient with manual disimpaction of the rectum along with use of an anoscope, large amount of stool was evacuated, only from the rectal area though.  Seen after OR.  Patient feeling markedly better although still slightly sore in her rectal area.  No other complaints.  Assessment/Plan: Principal Problem:   Fecal impaction in rectum [facility]): Appreciate GI and surgery assistance.  Stool evacuated and rectal vault.  We will try some Anusol cream to help decrease inflammation and then give prep this evening which now patient is much more amenable to. Active Problems:   Diabetes mellitus, type 2 ([place]): A1c last month  at 6.3.  Managed with metformin at home, currently on hold.  Sliding scale only.   Recurrent major depressive disorder, in partial remission ([place]): On Lexapro   Obesity (BMI 30-39.9): Patient meets criteria with BMI greater than 30   Code Status: Full code  Family Communication: No family present  Disposition Plan: GoLYTELY prep this evening, anticipate discharge tomorrow   Consultants:  General surgery  GI  Procedures:  Status post manual disimpaction with anoscopy done under anesthesia 7/1  Antimicrobials:  None  DVT prophylaxis: SCDs   Objective: Vitals:   09/05/17 1258 09/05/17 1312  BP: (!) 155/78 (!) 147/82  Pulse: 76 77  Resp: (!) 21 18  Temp:  (!) 97.5 F (36.4 C)  SpO2: 100% 100%    Intake/Output Summary (Last 24 hours) at 09/05/2017 1540 Last data filed at 09/05/2017 1434 Gross per 24 hour  Intake 1640.86 ml  Output 850 ml  Net 790.86 ml   Filed Weights   09/03/17 2050 09/05/17 1033  Weight: 82.6 kg (182 lb) 82.6 kg (182 lb)   Body mass index is 33.28 kg/m.  Exam:   General: Alert and oriented x3, no acute distress  Cardiovascular: Regular rate and rhythm, S1-S2  Respiratory: Clear to auscultation bilaterally  Abdomen: Soft, mild distention, nontender, positive bowel sounds  Musculoskeletal: SCDs on.  Skin: No skin breaks, tears or lesions  Psychiatry: Appropriate, no evidence of psychoses   Data Reviewed: CBC: Recent Labs  Lab 09/03/17 2250  WBC 10.0  NEUTROABS 7.2  HGB 13.6  HCT 42.8  MCV 89.9  PLT 353   Basic Metabolic Panel: Recent Labs  Lab 09/03/17 2250  NA 137  K 3.9  CL 102  CO2 25  GLUCOSE 144*  BUN 5*  CREATININE 0.70  CALCIUM 8.9   GFR: Estimated Creatinine Clearance: 69.8 mL/min (by C-G formula based on SCr of 0.7 mg/dL). Liver Function Tests: Recent Labs  Lab 09/03/17 2250  AST 22  ALT 22  ALKPHOS 66  BILITOT 0.7  PROT 7.0  ALBUMIN 3.9   Recent Labs  Lab 09/03/17 2250  LIPASE 42   No  results for input(s): AMMONIA in the last 168 hours. Coagulation Profile: No results for input(s): INR, PROTIME in the last 168 hours. Cardiac Enzymes: No results for input(s): CKTOTAL, CKMB, CKMBINDEX, TROPONINI in the last 168 hours. BNP (last 3 results) No results for input(s): PROBNP in the last 8760 hours. HbA1C: No results for input(s): HGBA1C in the last 72 hours. CBG: Recent Labs  Lab 09/05/17 0043 09/05/17 0519 09/05/17 0739 09/05/17 1004 09/05/17 1232  GLUCAP 107* 101* 110* 106* 105*   Lipid Profile: No results for input(s): CHOL, HDL, LDLCALC, TRIG, CHOLHDL, LDLDIRECT in the last 72 hours. Thyroid Function Tests: No results for input(s): TSH, T4TOTAL, FREET4, T3FREE, THYROIDAB in the last 72 hours. Anemia Panel: No results for input(s): VITAMINB12, FOLATE, FERRITIN, TIBC, IRON, RETICCTPCT in the last 72 hours. Urine analysis:    Component Value Date/Time   COLORURINE YELLOW 09/03/2017 2254   APPEARANCEUR CLEAR 09/03/2017 2254   APPEARANCEUR Clear 02/01/2014 0016   LABSPEC 1.005 09/03/2017 2254   LABSPEC 1.014 02/01/2014 0016   PHURINE 8.0 09/03/2017 2254   GLUCOSEU NEGATIVE 09/03/2017 2254   GLUCOSEU Negative 02/01/2014 0016   HGBUR SMALL (A) 09/03/2017 2254   BILIRUBINUR NEGATIVE 09/03/2017 2254   BILIRUBINUR Negative 02/01/2014 0016   KETONESUR NEGATIVE 09/03/2017 2254   PROTEINUR NEGATIVE 09/03/2017 2254   NITRITE NEGATIVE 09/03/2017 2254   LEUKOCYTESUR MODERATE (A) 09/03/2017 2254   LEUKOCYTESUR Negative 02/01/2014 0016   Sepsis Labs: @LABRCNTIP (procalcitonin:4,lacticidven:4)  ) Recent Results (from the past 240 hour(s))  Urine culture     Status: None   Collection Time: 09/03/17 10:54 PM  Result Value Ref Range Status   Specimen Description URINE, RANDOM  Final   Special Requests NONE  Final   Culture   Final    NO GROWTH Performed at [facility], [place] [address], [place], [place] [address]    Report Status 09/05/2017 FINAL  Final    Surgical pcr screen     Status: Abnormal   Collection Time: 09/05/17 10:10 AM  Result Value Ref Range Status   MRSA, PCR NEGATIVE NEGATIVE Final   Staphylococcus aureus POSITIVE (A) NEGATIVE Final    Comment: (NOTE) The Xpert SA Assay (FDA approved for NASAL specimens in patients 73 years of age and older), is one component of a comprehensive surveillance program. It is not intended to diagnose infection nor to guide or monitor treatment. Performed at [facility], [place] [address], [place], [place] [address]       Studies: No results found.  Scheduled Meds: . atorvastatin  20 mg Oral Daily  . escitalopram  5 mg Oral Daily  . hydrocortisone   Topical NOW  . insulin aspart  0-9 Units Subcutaneous Q4H  . metoCLOPramide (REGLAN) injection  10 mg Intravenous Once  . polyethylene glycol  4,000 mL Oral Once    Continuous Infusions: . lactated ringers 10 mL/hr at 09/05/17 1037  LOS: 0 days     Annita Brod, MD [facility]  To reach me or the doctor on call, go to: [email or web address] Password [email or web address]  09/05/2017, 3:40 PM

## 2017-09-05 NOTE — Progress Notes (Signed)
[facility] Progress Note     Subjective: CC- fecal impaction Patient states that she feels worse today than yesterday. Reports significant rectal pressure. Last BM was 2 weeks ago despite mineral oil enema and stool softeners at home. Patient also had an enema in the ED and attempted bedside manual disimpaction with no relief. States that she is nauseated and would not be able to tolerate PO bowel regimen.  Objective: Vital signs in last 24 hours: Temp:  [98.1 F (36.7 C)-98.2 F (36.8 C)] 98.2 F (36.8 C) (07/01 0521) Pulse Rate:  [62-72] 62 (07/01 0521) Resp:  [16-18] 16 (07/01 0521) BP: (125-164)/(64-87) 125/64 (07/01 0521) SpO2:  [98 %-100 %] 98 % (07/01 0521) Last BM Date: 08/29/17  Intake/Output from previous day: 06/30 0701 - 07/01 0700 In: 940.9 [I.V.:940.9] Out: 0  Intake/Output this shift: No intake/output data recorded.  PE: Gen:  Alert, NAD, pleasant HEENT: EOM's intact, pupils equal and round Card:  RRR, no M/G/R heard Pulm:  CTAB, no W/R/R, effort normal Abd: Soft, mild distension, multiple well healed incisions, +BS, no HSM, no hernia, mild global tenderness without rebound or guarding Ext:  Calves soft and nontender Psych: A&Ox3  Skin: no rashes noted, warm and dry  Lab Results:  Recent Labs    09/03/17 2250  WBC 10.0  HGB 13.6  HCT 42.8  PLT 258   BMET Recent Labs    09/03/17 2250  NA 137  K 3.9  CL 102  CO2 25  GLUCOSE 144*  BUN 5*  CREATININE 0.70  CALCIUM 8.9   PT/INR No results for input(s): LABPROT, INR in the last 72 hours. CMP     Component Value Date/Time   NA 137 09/03/2017 2250   NA 138 10/02/2014 1157   NA 140 02/01/2014 0016   K 3.9 09/03/2017 2250   K 3.5 02/01/2014 0016   CL 102 09/03/2017 2250   CL 107 02/01/2014 0016   CO2 25 09/03/2017 2250   CO2 23 02/01/2014 0016   GLUCOSE 144 (H) 09/03/2017 2250   GLUCOSE 146 (H) 02/01/2014 0016   BUN 5 (L) 09/03/2017 2250   BUN 11 10/02/2014 1157   BUN  13 02/01/2014 0016   CREATININE 0.70 09/03/2017 2250   CREATININE 0.67 07/15/2017 1012   CALCIUM 8.9 09/03/2017 2250   CALCIUM 8.5 02/01/2014 0016   PROT 7.0 09/03/2017 2250   PROT 7.5 10/02/2014 1157   PROT 7.0 02/01/2014 0016   ALBUMIN 3.9 09/03/2017 2250   ALBUMIN 4.6 10/02/2014 1157   ALBUMIN 3.5 02/01/2014 0016   AST 22 09/03/2017 2250   AST 19 02/01/2014 0016   ALT 22 09/03/2017 2250   ALT 22 02/01/2014 0016   ALKPHOS 66 09/03/2017 2250   ALKPHOS 81 02/01/2014 0016   BILITOT 0.7 09/03/2017 2250   BILITOT 0.4 10/02/2014 1157   BILITOT 0.7 02/01/2014 0016   GFRNONAA >60 09/03/2017 2250   GFRNONAA 92 07/15/2017 1012   GFRAA >60 09/03/2017 2250   GFRAA 107 07/15/2017 1012   Lipase     Component Value Date/Time   LIPASE 42 09/03/2017 2250   LIPASE 128 02/01/2014 0016       Studies/Results: Abd 1 View (kub)  Result Date: 09/04/2017 CLINICAL DATA:  Fecal impaction EXAM: ABDOMEN - 1 VIEW COMPARISON:  None. FINDINGS: Large amount of stool within the rectum. There is stool throughout the remainder of the colon. Prominent loops of small bowel demonstrated throughout the abdomen. Lung bases are clear. Surgical clips within  the right hemiabdomen. Postsurgical change central lower abdomen. Lumbar spine degenerative changes. IMPRESSION: Large amount of stool within the rectum. Additionally there is a large amount of stool within the cecum and ascending colon compatible with associated constipation. Prominent gaseous distended loops of small bowel. Electronically Signed   By: Lovey Newcomer M.D.   On: 09/04/2017 07:30    Anti-infectives: Anti-infectives (From admission, onward)   None       Assessment/Plan DM Depression H/o gastric bypass  Fecal impaction - Plan to take patient to the OR today for fecal disimpaction. Keep NPO. Will need foley placed in OR for urinary retention.  ID - none FEN - IVF, NPO VTE - SCDs Foley - none   LOS: 0 days    Brooke Liu ,  [facility] Surgery 09/05/2017, 8:44 AM Pager: [phone] Consults: [phone] Mon 7:00 am -11:30 AM Tues-Fri 7:00 am-4:30 pm Sat-Sun 7:00 am-11:30 am

## 2017-09-05 NOTE — Anesthesia Postprocedure Evaluation (Signed)
Anesthesia Post Note  Patient: Brooke Liu Nicely  Procedure(s) Performed: FECAL DISIMPACTION (N/A Rectum)     Patient location during evaluation: PACU Anesthesia Type: General Level of consciousness: awake and alert Pain management: pain level controlled Vital Signs Assessment: post-procedure vital signs reviewed and stable Respiratory status: spontaneous breathing, nonlabored ventilation, respiratory function stable and patient connected to nasal cannula oxygen Cardiovascular status: blood pressure returned to baseline and stable Postop Assessment: no apparent nausea or vomiting Anesthetic complications: no    Last Vitals:  Vitals:   09/05/17 1312 09/05/17 1736  BP: (!) 147/82 125/64  Pulse: 77 78  Resp: 18 17  Temp: (!) 36.4 C 37.2 C  SpO2: 100% 98%    Last Pain:  Vitals:   09/05/17 1736  TempSrc: Oral  PainSc:                  Shauna Bodkins

## 2017-09-05 NOTE — Anesthesia Procedure Notes (Signed)
Procedure Name: Intubation Date/Time: 09/05/2017 11:50 AM Performed by: Cleda Daub, CRNA Pre-anesthesia Checklist: Patient identified, Emergency Drugs available, Suction available and Patient being monitored Patient Re-evaluated:Patient Re-evaluated prior to induction Oxygen Delivery Method: Circle system utilized Preoxygenation: Pre-oxygenation with 100% oxygen Induction Type: IV induction, Cricoid Pressure applied and Rapid sequence Laryngoscope Size: Mac and 3 Grade View: Grade I Tube type: Oral Tube size: 7.0 mm Number of attempts: 1 Airway Equipment and Method: Stylet Placement Confirmation: ETT inserted through vocal cords under direct vision,  positive ETCO2 and breath sounds checked- equal and bilateral Secured at: 21 cm Tube secured with: Tape Dental Injury: Teeth and Oropharynx as per pre-operative assessment

## 2017-09-06 ENCOUNTER — Encounter (HOSPITAL_COMMUNITY): Payer: Self-pay | Admitting: General Surgery

## 2017-09-06 DIAGNOSIS — R159 Full incontinence of feces: Secondary | ICD-10-CM

## 2017-09-06 LAB — GLUCOSE, CAPILLARY
GLUCOSE-CAPILLARY: 100 mg/dL — AB (ref 70–99)
GLUCOSE-CAPILLARY: 140 mg/dL — AB (ref 70–99)
GLUCOSE-CAPILLARY: 247 mg/dL — AB (ref 70–99)
Glucose-Capillary: 98 mg/dL (ref 70–99)
Glucose-Capillary: 93 mg/dL (ref 70–99)

## 2017-09-06 MED ORDER — HYDROCORTISONE 2.5 % RE CREA
TOPICAL_CREAM | Freq: Four times a day (QID) | RECTAL | Status: DC
  Administered 2017-09-06 – 2017-09-07 (×2): via RECTAL
  Filled 2017-09-06: qty 28.35

## 2017-09-06 NOTE — Progress Notes (Signed)
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  Patient has had some issues with constipation, but usually keeps herself regular by drinking several cups of coffee.  She feels like her problem started with a combination of her switching over to mostly baby food as of late, restarting her mineral vitamins and less coffee.  She tried multiple medication to try to make her move her bowels, but this is lead to abdominal cramping but no actual bowel movements.  She is been unable to place an enema because her rectum has been so swollen and inflamed.  She presented to the emergency room and no response to milk of magnesia or soapsuds enema.  Patient underwent abdominal x-ray which noted significant amount of stool throughout the colon and a large amount of stool in the rectal area.  Seen by GI on 6/30 who recommended GoLYTELY prep.  Patient refused this as previous prep and enemas did not work and only cause cramping.  Surgery consulted who took patient to the OR And with anesthesia, patient with manual disimpaction of the rectum along with use of an anoscope, large amount of stool was evacuated, only from the rectal area though.  On evening of 7/1, patient then started on GoLYTELY prep.  She has had a strong response at this with multiple episodes of liquid stool.  Patient is still having episodes of bowel incontinence.  No real abdominal pain and overall she is feeling much better from when she first came in.  With liquid stool of sudden onset, likely in response to initial inflammation, manual disimpaction by surgery with use of anoscope and GoLYTELY prep.    Assessment/Plan: Principal  Problem:   Fecal impaction in rectum [facility]): Appreciate GI and surgery assistance.  Stool evacuated and rectal vault.  Now with GoLYTELY prep to help remove rest of stool in colon. Active Problems:   Diabetes mellitus, type 2 ([place]): A1c last month at 6.3.  Managed with metformin at home, currently on hold.  Sliding scale only.   Recurrent major depressive disorder, in partial remission ([place]): On Lexapro   Obesity (BMI 30-39.9): Patient meets criteria with BMI greater than 30  Bowel incontinence: Patient having multiple episodes with liquid stool of sudden onset, likely in response to initial inflammation, manual disimpaction by surgery with use of anoscope and GoLYTELY prep.  Medication should wear off by tomorrow and at that point, we will plan to discharge home   Code Status: Full code  Family Communication: No family present  Disposition Plan: Anticipate discharge tomorrow once GoLYTELY prep out of system.   Consultants:  General surgery  GI  Procedures:  Status post manual disimpaction with anoscopy done under anesthesia 7/1  Antimicrobials:  None  DVT prophylaxis: SCDs   Objective: Vitals:   09/06/17 1110 09/06/17 1334  BP: 132/63 140/75  Pulse: 75 63  Resp: 18 18  Temp: 98.5 F (36.9 C) 97.6 F (36.4 C)  SpO2: 98% 100%    Intake/Output Summary (Last 24 hours) at 09/06/2017 1536 Last data filed at 09/06/2017 1520 Gross per 24 hour  Intake 1440 ml  Output 1000 ml  Net 440 ml   Filed Weights   09/03/17 2050 09/05/17 1033  Weight: 82.6 kg (182 lb) 82.6 kg (182 lb)   Body mass index is 33.28 kg/m.  Exam:   General: Alert and oriented x3, no acute distress  Cardiovascular: Regular rate and rhythm, S1-S2  Respiratory: Clear to auscultation bilaterally  Abdomen: Soft, mild distention, nontender, positive bowel sounds  Musculoskeletal: SCDs on.  Skin: No skin breaks, tears or lesions  Psychiatry: Appropriate, no evidence of psychoses   Data  Reviewed: CBC: Recent Labs  Lab 09/03/17 2250  WBC 10.0  NEUTROABS 7.2  HGB 13.6  HCT 42.8  MCV 89.9  PLT 237   Basic Metabolic Panel: Recent Labs  Lab 09/03/17 2250  NA 137  K 3.9  CL 102  CO2 25  GLUCOSE 144*  BUN 5*  CREATININE 0.70  CALCIUM 8.9   GFR: Estimated Creatinine Clearance: 69.8 mL/min (by C-G formula based on SCr of 0.7 mg/dL). Liver Function Tests: Recent Labs  Lab 09/03/17 2250  AST 22  ALT 22  ALKPHOS 66  BILITOT 0.7  PROT 7.0  ALBUMIN 3.9   Recent Labs  Lab 09/03/17 2250  LIPASE 42   No results for input(s): AMMONIA in the last 168 hours. Coagulation Profile: No results for input(s): INR, PROTIME in the last 168 hours. Cardiac Enzymes: No results for input(s): CKTOTAL, CKMB, CKMBINDEX, TROPONINI in the last 168 hours. BNP (last 3 results) No results for input(s): PROBNP in the last 8760 hours. HbA1C: No results for input(s): HGBA1C in the last 72 hours. CBG: Recent Labs  Lab 09/05/17 2012 09/05/17 2352 09/06/17 0410 09/06/17 0835 09/06/17 1329  GLUCAP 174* 75 100* 140* 98   Lipid Profile: No results for input(s): CHOL, HDL, LDLCALC, TRIG, CHOLHDL, LDLDIRECT in the last 72 hours. Thyroid Function Tests: No results for input(s): TSH, T4TOTAL, FREET4, T3FREE, THYROIDAB in the last 72 hours. Anemia Panel: No results for input(s): VITAMINB12, FOLATE, FERRITIN, TIBC, IRON, RETICCTPCT in the last 72 hours. Urine analysis:    Component Value Date/Time   COLORURINE YELLOW 09/03/2017 2254   APPEARANCEUR CLEAR 09/03/2017 2254   APPEARANCEUR Clear 02/01/2014 0016   LABSPEC 1.005 09/03/2017 2254   LABSPEC 1.014 02/01/2014 0016   PHURINE 8.0 09/03/2017 2254   GLUCOSEU NEGATIVE 09/03/2017 2254   GLUCOSEU Negative 02/01/2014 0016   HGBUR SMALL (A) 09/03/2017 2254   BILIRUBINUR NEGATIVE 09/03/2017 2254   BILIRUBINUR Negative 02/01/2014 0016   KETONESUR NEGATIVE 09/03/2017 2254   PROTEINUR NEGATIVE 09/03/2017 2254   NITRITE NEGATIVE  09/03/2017 2254   LEUKOCYTESUR MODERATE (A) 09/03/2017 2254   LEUKOCYTESUR Negative 02/01/2014 0016   Sepsis Labs: @LABRCNTIP (procalcitonin:4,lacticidven:4)  ) Recent Results (from the past 240 hour(s))  Urine culture     Status: None   Collection Time: 09/03/17 10:54 PM  Result Value Ref Range Status   Specimen Description URINE, RANDOM  Final   Special Requests NONE  Final   Culture   Final    NO GROWTH Performed at [facility], [place] [address], [place], [place] [address]    Report Status 09/05/2017 FINAL  Final  Surgical pcr screen     Status: Abnormal   Collection Time: 09/05/17 10:10 AM  Result Value Ref Range Status   MRSA, PCR NEGATIVE NEGATIVE Final   Staphylococcus aureus POSITIVE (A) NEGATIVE Final    Comment: (NOTE) The Xpert SA Assay (FDA approved for NASAL specimens in patients 78 years of age and older), is one component of a comprehensive surveillance program. It is not intended to diagnose infection nor to guide or  monitor treatment. Performed at [facility], [place] [address] [place], [place] [address]       Studies: No results found.  Scheduled Meds: . atorvastatin  20 mg Oral Daily  . Chlorhexidine Gluconate Cloth  6 each Topical Daily  . escitalopram  5 mg Oral Daily  . insulin aspart  0-9 Units Subcutaneous Q4H  . metoCLOPramide (REGLAN) injection  10 mg Intravenous Once  . mupirocin ointment  1 application Nasal BID    Continuous Infusions: . lactated ringers 10 mL/hr at 09/05/17 1037     LOS: 1 day     Annita Brod, MD [facility]  To reach me or the doctor on call, go to: [email or web address] Password [facility]  09/06/2017, 3:36 PM

## 2017-09-06 NOTE — Plan of Care (Signed)
  Problem: Nutrition: Goal: Adequate nutrition will be maintained Outcome: Progressing   Problem: Coping: Goal: Level of anxiety will decrease Outcome: Progressing   

## 2017-09-07 LAB — BASIC METABOLIC PANEL
Anion gap: 7 (ref 5–15)
BUN: <5 mg/dL — ABNORMAL LOW (ref 8–23)
CALCIUM: 8.3 mg/dL — AB (ref 8.9–10.3)
CO2: 25 mmol/L (ref 22–32)
CREATININE: 0.57 mg/dL (ref 0.44–1.00)
Chloride: 110 mmol/L (ref 98–111)
GFR calc Af Amer: >60 mL/min (ref >60)
Glucose, Bld: 127 mg/dL — ABNORMAL HIGH (ref 70–99)
Potassium: 4 mmol/L (ref 3.5–5.1)
SODIUM: 142 mmol/L (ref 135–145)

## 2017-09-07 LAB — GLUCOSE, CAPILLARY
GLUCOSE-CAPILLARY: 127 mg/dL — AB (ref 70–99)
GLUCOSE-CAPILLARY: 131 mg/dL — AB (ref 70–99)
GLUCOSE-CAPILLARY: 164 mg/dL — AB (ref 70–99)
GLUCOSE-CAPILLARY: 68 mg/dL — AB (ref 70–99)
Glucose-Capillary: 127 mg/dL — ABNORMAL HIGH (ref 70–99)
Glucose-Capillary: 170 mg/dL — ABNORMAL HIGH (ref 70–99)
Glucose-Capillary: 126 mg/dL — ABNORMAL HIGH (ref 70–99)

## 2017-09-07 MED ORDER — HYDROCORTISONE 2.5 % RE CREA
TOPICAL_CREAM | Freq: Three times a day (TID) | RECTAL | Status: DC
  Administered 2017-09-07 – 2017-09-08 (×2): via RECTAL
  Filled 2017-09-07: qty 28.35

## 2017-09-07 MED ORDER — POLYETHYLENE GLYCOL 3350 17 G PO PACK
17.0000 g | PACK | Freq: Every day | ORAL | Status: DC
  Administered 2017-09-07 – 2017-09-08 (×3): 17 g via ORAL
  Filled 2017-09-07 (×3): qty 1

## 2017-09-07 MED ORDER — ALUM & MAG HYDROXIDE-SIMETH 200-200-20 MG/5ML PO SUSP
30.0000 mL | Freq: Four times a day (QID) | ORAL | Status: DC | PRN
  Administered 2017-09-07 – 2017-09-08 (×5): 30 mL via ORAL
  Filled 2017-09-07 (×5): qty 30

## 2017-09-07 NOTE — Progress Notes (Signed)
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 Patient has had some issues with constipation, but usually keeps herself regular by drinking several cups of coffee. She feels like her problem started with a combination of her switching over to mostly baby food as of late, restarting her mineral vitamins and less coffee. She tried multiple medication to try to make her move her bowels, but this is lead to abdominal cramping but no actual bowel movements. She is been unable to place an enema because her rectum has been so swollen and inflamed. She presented to the emergency room and no response to milk of magnesia or soapsuds enema. Patient underwent abdominal x-ray which noted significant amount of stool throughout the colon and a large amount of stool in the rectal area.  Seen by GI on 6/30 who recommended GoLYTELY prep.  Patient refused this as previous prep and enemas did not work and only cause cramping.  Surgery consulted who took patient to the OR And with anesthesia, patient with manual disimpaction of the rectum along with use of an anoscope, large amount of stool was evacuated, only from the rectal area though.  On evening of 7/1, patient then started on GoLYTELY prep.  She has had a strong response at this with multiple episodes of liquid stool.  Patient is still having episodes of bowel incontinence.  No real abdominal pain and overall she is feeling much better from when she first came in.  With liquid stool of sudden onset, likely in response to initial inflammation, manual disimpaction by surgery with use of anoscope and GoLYTELY prep.    Assessment & Plan:   Principal  Problem:   Fecal impaction in rectum [facility]) Active Problems:   Diabetes mellitus, type 2 (HCC)   Recurrent major depressive disorder, in partial remission (HCC)   Obesity (BMI 30-39.9)   1-Fecal impaction; rectum; post; Status post manual disimpaction with anoscopy done under anesthesia 7/1 Received Golyte after anoscopy also. Develops diarrhea, incontinence after.  Plan to advance diet today. No watery incontinence today.  Start miralax.   2-DM;  SSI.   Recurrent major depressive disorder, in partial remission ([place]): On Lexapro    Obesity (BMI 30-39.9): Patient meets criteria with BMI greater than 30  History of gastric Bypass sx.  Will need to establish care. Will ask tomorrow sx     DVT prophylaxis:  Code Status: full code Family Communication: care discussed with patient.  Disposition Plan: home in 24 hours  Consultants:   GI  Surgery    Procedures:   Status post manual disimpaction with anoscopy done under anesthesia 7/1    Antimicrobials:  none  Subjective: Feels better. Tolerating clear diet., no watery incontinence episode. Has not had BM today, worry about going home without trying food and having BM  Objective: Vitals:   09/07/17 0525 09/07/17 0826 09/07/17 1203 09/07/17 1556  BP: 112/67 118/66 109/66 111/63  Pulse: 63 67 (!) 59 75  Resp: 17     Temp: 97.8 F (36.6 C) 98.2 F (36.8 C) 98 F (36.7 C) 98 F (36.7 C)  TempSrc: Oral Oral Oral Oral  SpO2: 100% 98% 96% 98%  Weight:      Height:  Intake/Output Summary (Last 24 hours) at 09/07/2017 1641 Last data filed at 09/06/2017 1921 Gross per 24 hour  Intake 600 ml  Output -  Net 600 ml   Filed Weights   09/03/17 2050 09/05/17 1033  Weight: 82.6 kg (182 lb) 82.6 kg (182 lb)    Examination:  General exam: Appears calm and comfortable  Respiratory system: Clear to auscultation. Respiratory effort normal. Cardiovascular system: S1 & S2 heard, RRR. No JVD, murmurs, rubs, gallops  or clicks. No pedal edema. Gastrointestinal system: Abdomen is nondistended, soft and nontender. No organomegaly or masses felt. Normal bowel sounds heard. Rectal; mild echymosis perirectal area, skin.  Central nervous system: Alert and oriented. No focal neurological deficits. Extremities: Symmetric 5 x 5 power. Skin: No rashes, lesions or ulcers Psychiatry: Judgement and insight appear normal. Mood & affect appropriate.     Data Reviewed: I have personally reviewed following labs and imaging studies  CBC: Recent Labs  Lab 09/03/17 2250  WBC 10.0  NEUTROABS 7.2  HGB 13.6  HCT 42.8  MCV 89.9  PLT 275   Basic Metabolic Panel: Recent Labs  Lab 09/03/17 2250 09/07/17 0824  NA 137 142  K 3.9 4.0  CL 102 110  CO2 25 25  GLUCOSE 144* 127*  BUN 5* <5*  CREATININE 0.70 0.57  CALCIUM 8.9 8.3*   GFR: Estimated Creatinine Clearance: 69.8 mL/min (by C-G formula based on SCr of 0.57 mg/dL). Liver Function Tests: Recent Labs  Lab 09/03/17 2250  AST 22  ALT 22  ALKPHOS 66  BILITOT 0.7  PROT 7.0  ALBUMIN 3.9   Recent Labs  Lab 09/03/17 2250  LIPASE 42   No results for input(s): AMMONIA in the last 168 hours. Coagulation Profile: No results for input(s): INR, PROTIME in the last 168 hours. Cardiac Enzymes: No results for input(s): CKTOTAL, CKMB, CKMBINDEX, TROPONINI in the last 168 hours. BNP (last 3 results) No results for input(s): PROBNP in the last 8760 hours. HbA1C: No results for input(s): HGBA1C in the last 72 hours. CBG: Recent Labs  Lab 09/07/17 0002 09/07/17 0527 09/07/17 0826 09/07/17 1200 09/07/17 1635  GLUCAP 68* 127* 131* 164* 127*   Lipid Profile: No results for input(s): CHOL, HDL, LDLCALC, TRIG, CHOLHDL, LDLDIRECT in the last 72 hours. Thyroid Function Tests: No results for input(s): TSH, T4TOTAL, FREET4, T3FREE, THYROIDAB in the last 72 hours. Anemia Panel: No results for input(s): VITAMINB12, FOLATE, FERRITIN, TIBC, IRON, RETICCTPCT in  the last 72 hours. Sepsis Labs: No results for input(s): PROCALCITON, LATICACIDVEN in the last 168 hours.  Recent Results (from the past 240 hour(s))  Urine culture     Status: None   Collection Time: 09/03/17 10:54 PM  Result Value Ref Range Status   Specimen Description URINE, RANDOM  Final   Special Requests NONE  Final   Culture   Final    NO GROWTH Performed at [facility], [address], [place], [place] [address]    Report Status 09/05/2017 FINAL  Final  Surgical pcr screen     Status: Abnormal   Collection Time: 09/05/17 10:10 AM  Result Value Ref Range Status   MRSA, PCR NEGATIVE NEGATIVE Final   Staphylococcus aureus POSITIVE (A) NEGATIVE Final    Comment: (NOTE) The Xpert SA Assay (FDA approved for NASAL specimens in patients 34 years of age and older), is one component of a comprehensive surveillance program. It is not intended to diagnose infection nor to guide or monitor treatment. Performed at [facility]  [facility], [place] [address], [place], [place] [address]          Radiology Studies: No results found.      Scheduled Meds: . atorvastatin  20 mg Oral Daily  . Chlorhexidine Gluconate Cloth  6 each Topical Daily  . escitalopram  5 mg Oral Daily  . hydrocortisone   Rectal QID  . insulin aspart  0-9 Units Subcutaneous Q4H  . metoCLOPramide (REGLAN) injection  10 mg Intravenous Once  . mupirocin ointment  1 application Nasal BID   Continuous Infusions: . lactated ringers 10 mL/hr at 09/05/17 1037     LOS: 2 days    Time spent: 35 minutes.     Elmarie Shiley, MD [facility] Pager [phone]  If 7PM-7AM, please contact night-coverage [email or web address] Password [facility] 09/07/2017, 4:41 PM

## 2017-09-08 LAB — GLUCOSE, CAPILLARY
GLUCOSE-CAPILLARY: 157 mg/dL — AB (ref 70–99)
Glucose-Capillary: 109 mg/dL — ABNORMAL HIGH (ref 70–99)
Glucose-Capillary: 124 mg/dL — ABNORMAL HIGH (ref 70–99)
Glucose-Capillary: 98 mg/dL (ref 70–99)

## 2017-09-08 MED ORDER — HYDROCORTISONE 2.5 % RE CREA: TOPICAL_CREAM | Freq: Three times a day (TID) | RECTAL | 0 refills | Status: AC

## 2017-09-08 MED ORDER — TRIAMCINOLONE ACETONIDE 0.025 % EX CREA
TOPICAL_CREAM | Freq: Two times a day (BID) | CUTANEOUS | Status: DC
  Filled 2017-09-08: qty 15

## 2017-09-08 MED ORDER — POLYETHYLENE GLYCOL 3350 17 G PO PACK: 17.0000 g | PACK | Freq: Every day | ORAL | 0 refills | Status: DC

## 2017-09-08 NOTE — Progress Notes (Addendum)
Patient ID: Brooke Liu, female   DOB: [birth year], 67 y.o.   MRN: [id number]    Progress Note   Subjective   Asked by the hospitalist to reevaluate patient.  Patient was seen by GI on 09/04/2017 with a large fecal impaction.  She declined to take a GoLYTELY prep, was ultimately seen by surgery and taken to the OR on 09/05/2017 for an anal block and then manual disimpaction of a large amount of stool.  Later on that day she was amenable to GoLYTELY prep which she completed and per the notes past multiple stools through 09/06/2017 and was even incontinent of stool. She has not had a BM today , or yesteray . She has been feeling fine except sore from disimpaction, no c/o abdominal  pain, distention etc. Eating soft diet, up walking etc.  Thinks she got constipated because she had gone on a high protein diet /Shakes    KUB on 09/04/2017 large amount of stool in the rectum and also large amount of stool in the cecum and right colon No imaging done since   Objective   Vital signs in last 24 hours: Temp:  [97.7 F (36.5 C)-98.3 F (36.8 C)] 97.7 F (36.5 C) (07/04 0521) Pulse Rate:  [67-75] 67 (07/04 0521) Resp:  [17-18] 17 (07/04 0521) BP: (111-144)/(61-76) 112/61 (07/04 0521) SpO2:  [96 %-100 %] 96 % (07/04 0521) Last BM Date: (P) 09/07/17 General:    white female in NAD Heart:  Regular rate and rhythm; no murmurs Lungs: Respirations even and unlabored, lungs CTA bilaterally Abdomen:  Soft, nontender and nondistended. Normal bowel sounds. Rectal - per Dr Ardis Hughs - no stool in vault- no impaction , there is a lot of perianal bruising Extremities:  Without edema. Neurologic:  Alert and oriented,  grossly normal neurologically. Psych:  Cooperative. Normal mood and affect.  BMET Recent Labs    09/07/17 0824  NA 142  K 4.0  CL 110  CO2 25  GLUCOSE 127*  BUN <5*  CREATININE 0.57  CALCIUM 8.3*     Assessment / Plan:     #1 67 yo WF admitted with fecal impaction - manually  disimpacted in OR 7/1 , then bowel prep /purge 7/1-7/2  Successful  Impaction and constipation have resolved - not having BM past 24 hour because bowel purged-  Ok for discharge home  Today  Cancelled abd films  Advised to take 2 doses of miralax daily  Until having good BM's then decrease to once daily chronically   Liberal fluid intake  She is to F/U with Dr Allen Norris /Gastroenterology in [place]               [phone]    ________________________________________________________________________  Velora Heckler GI MD note:  I personally examined the patient, reviewed the data and agree with the assessment and plan described above.  After OR disimpaction and the bowel prep, both of which resulted in a large stool output, I suspect she simply has no solid stool in her colon anymore and may not for another few days.  There is a lot of bruising around her anus but no stool in the vault and clinically no signs of obstruction.  She is OK to go home today, understands it may take another few days before she is having normal stools again. In the meantime 2 doses of miralax daily.  Please call or page with any further questions or concerns.  Owens Loffler, MD [facility] Pager [phone]

## 2017-09-08 NOTE — Progress Notes (Signed)
Patient request for IV to be taken out of arm. States that it's hurting really bad and burning her arm.

## 2017-09-08 NOTE — Discharge Summary (Signed)
Physician Discharge Summary  Brooke Liu QQP:[id number] DOB: 1950/08/24 DOA: 09/03/2017  PCP: Steele Sizer, MD  Admit date: 09/03/2017 Discharge date: 09/08/2017  Admitted From: Home  Disposition: Home   Recommendations for Outpatient Follow-up:  1. Follow up with PCP in 1-2 weeks 2. Please obtain BMP/CBC in one week 3. Follow with     Discharge Condition; stable.  CODE STATUS: full code.  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 Patient has had some issues with constipation, but usually keeps herself regular by drinking several cups of coffee. She feels like her problem started with a combination of her switching over to mostly baby food as of late, restarting her mineral vitamins and less coffee. She tried multiple medication to try to make her move her bowels, but this is lead to abdominal cramping but no actual bowel movements. She is been unable to place an enema because her rectum has been so swollen and inflamed. She presented to the emergency room and no response to milk of magnesia or soapsuds enema. Patient underwent abdominal x-ray which noted significant amount of stool throughout the colon and a large amount of stool in the rectal area.  Seen by GI on 6/30 who recommended GoLYTELY prep. Patient refused this as previous prep and enemas did not work and only cause cramping. Surgery consulted who took patient to the OR And with anesthesia, patient with manual disimpaction of the rectum along with use of an anoscope, large amount of stool was evacuated, only from the rectal area though. On evening of 7/1, patient then started on GoLYTELY prep. She has had a strong response at this with multiple episodes of liquid  stool.  Patient is still having episodes of bowel incontinence. No real abdominal pain and overall she is feeling much better from when she first came in. With liquid stool of sudden onset, likely in response to initial inflammation, manual disimpaction by surgery with use of anoscope and GoLYTELY prep.    Assessment & Plan:   Principal Problem:   Fecal impaction in rectum [facility]) Active Problems:   Diabetes mellitus, type 2 (HCC)   Recurrent major depressive disorder, in partial remission (HCC)   Obesity (BMI 30-39.9)   1-Fecal impaction; rectum; post; Status post manual disimpaction with anoscopy done under anesthesia 7/1 Received Golyte after anoscopy also. Develops diarrhea, incontinence after.  Plan to advance diet today. No watery incontinence today.  Started miralax.  No BM in last 24 hours, Asked GI to follow up on patient today, to see if further test was needed. They evaluated patient and recommended discharge home today.   2-DM;  SSI. resume metformin.   Recurrent major depressive disorder, in partial remission ([place]): On Lexapro  Obesity (BMI 30-39.9): Patient meets criteria with BMI greater than 30  History of gastric Bypass sx.  Office close, will give information for Dr Donne Hazel.  To follow at bariatric clionic   Discharge Diagnoses:  Principal Problem:   Fecal impaction in rectum [facility]) Active Problems:   Diabetes mellitus, type 2 (HCC)   Recurrent major depressive disorder, in partial remission (HCC)   Obesity (BMI 30-39.9)    Discharge Instructions  Discharge Instructions    Diet - low sodium heart healthy   Complete by:  As directed    Increase  activity slowly   Complete by:  As directed      Allergies as of 09/08/2017      Reactions   Adhesive [tape] Other (See Comments)   Patient's skin is VERY THIN and TEARS VERY EASILY!! PLEASE USE COBAN WRAP!!   Oxycodone-acetaminophen Hives, Itching   Very itchy underneath the skin   Percocet  [oxycodone-acetaminophen] Hives, Itching   Very itchy underneath the skin   Iodinated Diagnostic Agents Itching, Rash   MRI Dye      Medication List    STOP taking these medications   COCONUT OIL PO   GINKOBA PO     TAKE these medications   acetaminophen 500 MG tablet Commonly known as:  TYLENOL Take 500 mg by mouth every 6 (six) hours as needed for mild pain, moderate pain or headache.   atorvastatin 20 MG tablet Commonly known as:  LIPITOR Take 1 tablet (20 mg total) by mouth daily.   BARIATRIC FUSION Chew Chew 1 tablet by mouth 4 (four) times daily.   desloratadine 5 MG tablet Commonly known as:  CLARINEX Take 1 tablet (5 mg total) by mouth daily. What changed:    when to take this  reasons to take this   ergocalciferol 50000 units capsule Commonly known as:  VITAMIN D2 Take 1 capsule (50,000 Units total) by mouth once a week. What changed:  when to take this   escitalopram 5 MG tablet Commonly known as:  LEXAPRO Take 1 tablet (5 mg total) by mouth daily.   fluticasone 50 MCG/ACT nasal spray Commonly known as:  FLONASE Place 2 sprays into both nostrils daily. What changed:    when to take this  reasons to take this   hydrocortisone 2.5 % rectal cream Commonly known as:  ANUSOL-HC Place rectally 3 (three) times daily for 2 days.   metFORMIN 1000 MG tablet Commonly known as:  GLUCOPHAGE TAKE 1/2 TABLETS (500 MG TOTAL) BY MOUTH 2 (TWO) TIMES DAILY WITH A MEAL.   mometasone 0.1 % ointment Commonly known as:  ELOCON Apply topically daily. What changed:    how much to take  when to take this  reasons to take this   polyethylene glycol packet Commonly known as:  MIRALAX / GLYCOLAX Take 17 g by mouth daily. Start taking on:  09/09/2017   ranitidine 150 MG tablet Commonly known as:  ZANTAC Take 1 tablet (150 mg total) by mouth 2 (two) times daily as needed for heartburn.   triamcinolone ointment 0.5 % Commonly known as:  KENALOG Apply 1  application topically 2 (two) times daily. What changed:    when to take this  reasons to take this      Dawson, [place], MD. Schedule an appointment as soon as possible for a visit in 1 month(s).   Specialty:  Family Medicine Why:  (Weight loss center with nutritionist who have experience with pts with bariatric surgery) Contact information: [place] [address] [phone]        Rolm Bookbinder, MD Follow up in 1 week(s).   Specialty:  General Surgery Why:  Needs follow up with bariiatric clinic.  Contact information: [address] [place] [address] [phone]          Allergies  Allergen Reactions  . Adhesive [Tape] Other (See Comments)    Patient's skin is VERY THIN and TEARS VERY EASILY!! PLEASE USE COBAN WRAP!!  . Oxycodone-Acetaminophen Hives and Itching  Very itchy underneath the skin  . Percocet [Oxycodone-Acetaminophen] Hives and Itching    Very itchy underneath the skin  . Iodinated Diagnostic Agents Itching and Rash    MRI Dye    Consultations:  GI   Surgery    Procedures/Studies: Abd 1 View (kub)  Result Date: 09/04/2017 CLINICAL DATA:  Fecal impaction EXAM: ABDOMEN - 1 VIEW COMPARISON:  None. FINDINGS: Large amount of stool within the rectum. There is stool throughout the remainder of the colon. Prominent loops of small bowel demonstrated throughout the abdomen. Lung bases are clear. Surgical clips within the right hemiabdomen. Postsurgical change central lower abdomen. Lumbar spine degenerative changes. IMPRESSION: Large amount of stool within the rectum. Additionally there is a large amount of stool within the cecum and ascending colon compatible with associated constipation. Prominent gaseous distended loops of small bowel. Electronically Signed   By: Lovey Newcomer M.D.   On: 09/04/2017 07:30     Subjective: No BM, denies abdominal pain. Pas gas yesterday    Discharge Exam: Vitals:   09/08/17 0521 09/08/17 1347  BP: 112/61 (!) 157/84  Pulse: 67 64  Resp: 17 17  Temp: 97.7 F (36.5 C) 99 F (37.2 C)  SpO2: 96% 100%   Vitals:   09/07/17 1556 09/07/17 2105 09/08/17 0521 09/08/17 1347  BP: 111/63 (!) 144/76 112/61 (!) 157/84  Pulse: 75 74 67 64  Resp:  18 17 17   Temp: 98 F (36.7 C) 98.3 F (36.8 C) 97.7 F (36.5 C) 99 F (37.2 C)  TempSrc: Oral Oral Oral Oral  SpO2: 98% 100% 96% 100%  Weight:      Height:        General: Pt is alert, awake, not in acute distress Cardiovascular: RRR, S1/S2 +, no rubs, no gallops Respiratory: CTA bilaterally, no wheezing, no rhonchi Abdominal: Soft, NT, ND, bowel sounds + Extremities: no edema, no cyanosis    The results of significant diagnostics from this hospitalization (including imaging, microbiology, ancillary and laboratory) are listed below for reference.     Microbiology: Recent Results (from the past 240 hour(s))  Urine culture     Status: None   Collection Time: 09/03/17 10:54 PM  Result Value Ref Range Status   Specimen Description URINE, RANDOM  Final   Special Requests NONE  Final   Culture   Final    NO GROWTH Performed at [facility], [address], [place], [place] [address]    Report Status 09/05/2017 FINAL  Final  Surgical pcr screen     Status: Abnormal   Collection Time: 09/05/17 10:10 AM  Result Value Ref Range Status   MRSA, PCR NEGATIVE NEGATIVE Final   Staphylococcus aureus POSITIVE (A) NEGATIVE Final    Comment: (NOTE) The Xpert SA Assay (FDA approved for NASAL specimens in patients 38 years of age and older), is one component of a comprehensive surveillance program. It is not intended to diagnose infection nor to guide or monitor treatment. Performed at [facility], [place] [address] [place], [place] [address]      Labs: BNP (last 3 results) No results for input(s): BNP in the last 8760 hours. Basic Metabolic Panel: Recent Labs   Lab 09/03/17 2250 09/07/17 0824  NA 137 142  K 3.9 4.0  CL 102 110  CO2 25 25  GLUCOSE 144* 127*  BUN 5* <5*  CREATININE 0.70 0.57  CALCIUM 8.9 8.3*   Liver Function Tests: Recent Labs  Lab 09/03/17 2250  AST  22  ALT 22  ALKPHOS 66  BILITOT 0.7  PROT 7.0  ALBUMIN 3.9   Recent Labs  Lab 09/03/17 2250  LIPASE 42   No results for input(s): AMMONIA in the last 168 hours. CBC: Recent Labs  Lab 09/03/17 2250  WBC 10.0  NEUTROABS 7.2  HGB 13.6  HCT 42.8  MCV 89.9  PLT 258   Cardiac Enzymes: No results for input(s): CKTOTAL, CKMB, CKMBINDEX, TROPONINI in the last 168 hours. BNP: Invalid input(s): POCBNP CBG: Recent Labs  Lab 09/07/17 2142 09/08/17 0015 09/08/17 0410 09/08/17 0802 09/08/17 1157  GLUCAP 126* 109* 98 157* 124*   D-Dimer No results for input(s): DDIMER in the last 72 hours. Hgb A1c No results for input(s): HGBA1C in the last 72 hours. Lipid Profile No results for input(s): CHOL, HDL, LDLCALC, TRIG, CHOLHDL, LDLDIRECT in the last 72 hours. Thyroid function studies No results for input(s): TSH, T4TOTAL, T3FREE, THYROIDAB in the last 72 hours.  Invalid input(s): FREET3 Anemia work up No results for input(s): VITAMINB12, FOLATE, FERRITIN, TIBC, IRON, RETICCTPCT in the last 72 hours. Urinalysis    Component Value Date/Time   COLORURINE YELLOW 09/03/2017 2254   APPEARANCEUR CLEAR 09/03/2017 2254   APPEARANCEUR Clear 02/01/2014 0016   LABSPEC 1.005 09/03/2017 2254   LABSPEC 1.014 02/01/2014 0016   PHURINE 8.0 09/03/2017 2254   GLUCOSEU NEGATIVE 09/03/2017 2254   GLUCOSEU Negative 02/01/2014 0016   HGBUR SMALL (A) 09/03/2017 2254   BILIRUBINUR NEGATIVE 09/03/2017 2254   BILIRUBINUR Negative 02/01/2014 0016   KETONESUR NEGATIVE 09/03/2017 2254   PROTEINUR NEGATIVE 09/03/2017 2254   NITRITE NEGATIVE 09/03/2017 2254   LEUKOCYTESUR MODERATE (A) 09/03/2017 2254   LEUKOCYTESUR Negative 02/01/2014 0016   Sepsis Labs Invalid input(s):  PROCALCITONIN,  WBC,  LACTICIDVEN Microbiology Recent Results (from the past 240 hour(s))  Urine culture     Status: None   Collection Time: 09/03/17 10:54 PM  Result Value Ref Range Status   Specimen Description URINE, RANDOM  Final   Special Requests NONE  Final   Culture   Final    NO GROWTH Performed at [facility], [place] [address] [place], [place] [address]    Report Status 09/05/2017 FINAL  Final  Surgical pcr screen     Status: Abnormal   Collection Time: 09/05/17 10:10 AM  Result Value Ref Range Status   MRSA, PCR NEGATIVE NEGATIVE Final   Staphylococcus aureus POSITIVE (A) NEGATIVE Final    Comment: (NOTE) The Xpert SA Assay (FDA approved for NASAL specimens in patients 26 years of age and older), is one component of a comprehensive surveillance program. It is not intended to diagnose infection nor to guide or monitor treatment. Performed at [facility], [place] [address] [place], [place] [address]      Time coordinating discharge: 35 minutes  SIGNED:   Elmarie Shiley, MD  [facility] 09/08/2017, 3:06 PM Pager [phone]  If 7PM-7AM, please contact night-coverage [email or web address] Password [email or web address]

## 2017-09-14 ENCOUNTER — Ambulatory Visit (INDEPENDENT_AMBULATORY_CARE_PROVIDER_SITE_OTHER): Payer: Medicare Other | Admitting: Gastroenterology

## 2017-09-14 ENCOUNTER — Encounter: Payer: Self-pay | Admitting: Gastroenterology

## 2017-09-14 VITALS — BP 155/79 | HR 62 | Ht 62.0 in | Wt 183.6 lb

## 2017-09-14 DIAGNOSIS — K5900 Constipation, unspecified: Secondary | ICD-10-CM | POA: Diagnosis not present

## 2017-09-14 DIAGNOSIS — K219 Gastro-esophageal reflux disease without esophagitis: Secondary | ICD-10-CM | POA: Diagnosis not present

## 2017-09-14 NOTE — Progress Notes (Signed)
Jonathon Bellows MD, MRCP(U.K) [address]  [place], [place] [address]  Main: [phone]  Fax: [phone]   Primary Care Physician: Steele Sizer, MD  Primary Gastroenterologist:  Dr. Jonathon Bellows   No chief complaint on file.   HPI: Brooke Liu is a 67 y.o. female    She is here today to see me as a follow up when he was discharged from [facility] and seen by Dr Ardis Hughs on 09/08/17. She had been seen by Dr Allen Norris back in 12/2016 for GERD, epigastric and chest pain. Appears that she had to undergo an anal block and manual disimpaction of a large qty of stool in the OR on 09/05/17 after she refused to drink Golytely . Discharged on miralax. She has a history of gastric bypass 15 years back, diabetes .Normal colonoscopy in 11/2015 along with EGD showing features of gastric bypass.   Recent Labs : CBC,LFT-normal . TSH normal 11/2016  Since discharge she says she is doing better. On miralax twice daily . Says it is working . She missed a dose yesterday , had a soft bowel movements. Says she does not want to change the RX. Marland Kitchen Presently she says she has a lot of heartburn and reflux to the point she cant get any pills down . Says she tried Zantac.     Current Outpatient Medications  Medication Sig Dispense Refill  . acetaminophen (TYLENOL) 500 MG tablet Take 500 mg by mouth every 6 (six) hours as needed for mild pain, moderate pain or headache.    Marland Kitchen atorvastatin (LIPITOR) 20 MG tablet Take 1 tablet (20 mg total) by mouth daily. 90 tablet 1  . desloratadine (CLARINEX) 5 MG tablet Take 1 tablet (5 mg total) by mouth daily. (Patient taking differently: Take 5 mg by mouth daily as needed (for allergies). ) 90 tablet 1  . ergocalciferol (VITAMIN D2) 50000 units capsule Take 1 capsule (50,000 Units total) by mouth once a week. (Patient taking differently: Take 50,000 Units by mouth every Saturday. ) 12 capsule 0  . escitalopram (LEXAPRO) 5 MG tablet Take 1 tablet (5 mg  total) by mouth daily. 90 tablet 0  . fluticasone (FLONASE) 50 MCG/ACT nasal spray Place 2 sprays into both nostrils daily. (Patient taking differently: Place 2 sprays into both nostrils daily as needed for allergies or rhinitis. ) 16 g 2  . metFORMIN (GLUCOPHAGE) 1000 MG tablet TAKE 1/2 TABLETS (500 MG TOTAL) BY MOUTH 2 (TWO) TIMES DAILY WITH A MEAL. 90 tablet 0  . mometasone (ELOCON) 0.1 % ointment Apply topically daily. (Patient taking differently: Apply 1 application topically daily as needed (for stress eczema). ) 45 g 0  . Multiple Vitamins-Minerals (BARIATRIC FUSION) CHEW Chew 1 tablet by mouth 4 (four) times daily.    . polyethylene glycol (MIRALAX / GLYCOLAX) packet Take 17 g by mouth daily. 14 each 0  . ranitidine (ZANTAC) 150 MG tablet Take 1 tablet (150 mg total) by mouth 2 (two) times daily as needed for heartburn. 180 tablet 1  . triamcinolone ointment (KENALOG) 0.5 % Apply 1 application topically 2 (two) times daily. (Patient taking differently: Apply 1 application topically 2 (two) times daily as needed (for stress eczema). ) 30 g 0   No current facility-administered medications for this visit.     Allergies as of 09/14/2017 - Review Complete 09/05/2017  Allergen Reaction Noted  . Adhesive [tape] Other (See Comments) 09/04/2017  . Oxycodone-acetaminophen Hives and Itching 09/16/2015  .  Percocet [oxycodone-acetaminophen] Hives and Itching 02/14/2015  . Iodinated diagnostic agents Itching and Rash 09/25/2014    ROS:  General: Negative for anorexia, weight loss, fever, chills, fatigue, weakness. ENT: Negative for hoarseness, difficulty swallowing , nasal congestion. CV: Negative for chest pain, angina, palpitations, dyspnea on exertion, peripheral edema.  Respiratory: Negative for dyspnea at rest, dyspnea on exertion, cough, sputum, wheezing.  GI: See history of present illness. GU:  Negative for dysuria, hematuria, urinary incontinence, urinary frequency, nocturnal urination.   Endo: Negative for unusual weight change.    Physical Examination:   There were no vitals taken for this visit.  General: Well-nourished, well-developed in no acute distress.  Eyes: No icterus. Conjunctivae pink. Mouth: Oropharyngeal mucosa moist and pink , no lesions erythema or exudate. Lungs: Clear to auscultation bilaterally. Non-labored. Heart: Regular rate and rhythm, no murmurs rubs or gallops.  Abdomen: Bowel sounds are normal, nontender, nondistended, no hepatosplenomegaly or masses, no abdominal bruits or hernia , no rebound or guarding.   Extremities: No lower extremity edema. No clubbing or deformities. Neuro: Alert and oriented x 3.  Grossly intact. Skin: Warm and dry, no jaundice.   Psych: Alert and cooperative, normal mood and affect.   Imaging Studies: Abd 1 View (kub)  Result Date: 09/04/2017 CLINICAL DATA:  Fecal impaction EXAM: ABDOMEN - 1 VIEW COMPARISON:  None. FINDINGS: Large amount of stool within the rectum. There is stool throughout the remainder of the colon. Prominent loops of small bowel demonstrated throughout the abdomen. Lung bases are clear. Surgical clips within the right hemiabdomen. Postsurgical change central lower abdomen. Lumbar spine degenerative changes. IMPRESSION: Large amount of stool within the rectum. Additionally there is a large amount of stool within the cecum and ascending colon compatible with associated constipation. Prominent gaseous distended loops of small bowel. Electronically Signed   By: Lovey Newcomer M.D.   On: 09/04/2017 07:30    Assessment and Plan:   Brooke Liu is a 67 y.o. y/o female here referred back after a hospital discharge for fecal impaction requiring disimpaction in the OR. Also has some GERD. Diet poor in fiber and has a lot of processed foods.   Plan  1. High fiber diet -patient information provided. Counseled on eating healthy 2. Continue Miralax daily 3. Daily Zantac BID for reflux- once adapts  healthier life style can wean off   Dr Jonathon Bellows  MD,MRCP [facility]) Follow up PRN

## 2017-10-04 ENCOUNTER — Telehealth: Payer: Self-pay | Admitting: Family Medicine

## 2017-10-04 ENCOUNTER — Other Ambulatory Visit: Payer: Self-pay | Admitting: Family Medicine

## 2017-10-04 DIAGNOSIS — F3341 Major depressive disorder, recurrent, in partial remission: Secondary | ICD-10-CM

## 2017-10-04 MED ORDER — ESCITALOPRAM OXALATE 10 MG PO TABS: 5.0000 mg | ORAL_TABLET | Freq: Every day | ORAL | 0 refills | Status: DC

## 2017-10-04 NOTE — Telephone Encounter (Signed)
Copied from [place] [id number]. Topic: Quick Communication - Rx Refill/Question >> Oct 04, 2017  1:43 PM Burchel, Abbi R wrote: Medication: escitalopram (LEXAPRO) 5 MG tablet  Pt has been stopped taking rx 3 weeks ago bc rx was too expensive.  Pt is requesting that Dr Ancil Boozer change the rx back to 10mg  tabs bc it is much less expensive.  Please call pt when/if completed.   Preferred Pharmacy : [id number] [place] [place] [address] Phone: [phone] Fax: [phone]  Pt: [phone]

## 2017-10-11 ENCOUNTER — Other Ambulatory Visit: Payer: Self-pay | Admitting: Family Medicine

## 2017-10-11 DIAGNOSIS — E559 Vitamin D deficiency, unspecified: Secondary | ICD-10-CM

## 2017-10-29 ENCOUNTER — Other Ambulatory Visit: Payer: Self-pay | Admitting: Family Medicine

## 2017-10-29 DIAGNOSIS — E785 Hyperlipidemia, unspecified: Principal | ICD-10-CM

## 2017-10-29 DIAGNOSIS — E1169 Type 2 diabetes mellitus with other specified complication: Secondary | ICD-10-CM

## 2017-11-11 ENCOUNTER — Other Ambulatory Visit: Payer: Self-pay | Admitting: Family Medicine

## 2017-11-11 DIAGNOSIS — E785 Hyperlipidemia, unspecified: Principal | ICD-10-CM

## 2017-11-11 DIAGNOSIS — E1169 Type 2 diabetes mellitus with other specified complication: Secondary | ICD-10-CM

## 2017-11-11 DIAGNOSIS — L309 Dermatitis, unspecified: Secondary | ICD-10-CM

## 2017-11-11 MED ORDER — METFORMIN HCL 1000 MG PO TABS: ORAL_TABLET | ORAL | 0 refills | Status: DC

## 2017-11-11 NOTE — Telephone Encounter (Signed)
Refill request was sent to Dr. Krichna Sowles for approval and submission.  

## 2017-11-11 NOTE — Telephone Encounter (Signed)
Copied from [place] #[id number]. Topic: Quick Communication - Rx Refill/Question >> Nov 11, 2017  8:47 AM Oliver Pila B wrote: Pt called b/c she has a real bad eczema breakout on face and hands  Medication: mometasone (ELOCON) 0.1 % ointment [[id number]]  triamcinolone ointment (KENALOG) 0.5 % [[id number]]   Has the patient contacted their pharmacy? Yes.   (Agent: If no, request that the patient contact the pharmacy for the refill.) (Agent: If yes, when and what did the pharmacy advise?)  [facility]  Agent: Please be advised that RX refills may take up to 3 business days. We ask that you follow-up with your pharmacy.

## 2017-11-11 NOTE — Telephone Encounter (Signed)
Copied from [place] [id number]. Topic: Quick Communication - Rx Refill/Question >> Nov 11, 2017 11:31 AM Mylinda Latina, NT wrote: Medication: metFORMIN (GLUCOPHAGE) 1000 MG tablet  Has the patient contacted their pharmacy? No. (Agent: If no, request that the patient contact the pharmacy for the refill.) (Agent: If yes, when and what did the pharmacy advise?)  Preferred Pharmacy (with phone number or street name): [id number] [place], [place] [phone] (Phone) [phone] (Fax)    Agent: Please be advised that RX refills may take up to 3 business days. We ask that you follow-up with your pharmacy.

## 2017-11-18 ENCOUNTER — Telehealth: Payer: Self-pay

## 2017-11-18 NOTE — Telephone Encounter (Signed)
Copied from [place] [id number] [phone]. Topic: General - Other >> Nov 16, 2017  4:28 PM Sheran Luz wrote: Reason for CRM: Pt calling Soriah Leeman back to let her know that the refill request for [facility] could be discarded. Pt would also like a call back to discuss whether she should find another doctor for refills for the next 6 mo as pt is in [place], or if pt could continue getting them all called in by Dr. Ancil Boozer.

## 2017-11-18 NOTE — Telephone Encounter (Signed)
She will need to see someone in [place], she has been hospitalized after she saw me in May and did not come in for follow up. She needs labs.

## 2017-11-21 NOTE — Telephone Encounter (Signed)
Spoke with patient and advised her per Dr. Ancil Boozer to find a doctor while in [place] for the next 6 months to manage her care. Patient had an appt for 11/25/2017 but will not be here. Please cancel appointment,.

## 2017-11-21 NOTE — Telephone Encounter (Signed)
Per Tiffany request I have cancelled pt appointment for this month.

## 2017-11-25 ENCOUNTER — Ambulatory Visit: Payer: Self-pay | Admitting: Family Medicine

## 2017-12-27 ENCOUNTER — Other Ambulatory Visit: Payer: Self-pay | Admitting: Family Medicine

## 2017-12-27 DIAGNOSIS — F3341 Major depressive disorder, recurrent, in partial remission: Secondary | ICD-10-CM

## 2017-12-27 MED ORDER — ESCITALOPRAM OXALATE 10 MG PO TABS: 5.0000 mg | ORAL_TABLET | Freq: Every day | ORAL | 0 refills | Status: DC

## 2017-12-27 NOTE — Telephone Encounter (Signed)
Refill request was sent to Dr. Krichna Sowles for approval and submission.  

## 2017-12-27 NOTE — Telephone Encounter (Signed)
Copied from [place] [id number]. Topic: Quick Communication - See Telephone Encounter >> Dec 27, 2017 12:28 PM Conception Chancy, NT wrote: CRM for notification. See Telephone encounter for: 12/27/17.  Patient is calling and has a medication follow up appointment on 02/14/18 Dr. Ancil Boozer first available. She is needing a refill on escitalopram (LEXAPRO) 10 MG tablet.  [id number] [address], [place] [address] Phone: [phone] Fax: [phone]

## 2018-01-10 ENCOUNTER — Ambulatory Visit: Payer: BLUE CROSS/BLUE SHIELD | Admitting: Internal Medicine

## 2018-01-28 ENCOUNTER — Other Ambulatory Visit: Payer: Self-pay | Admitting: Family Medicine

## 2018-01-28 DIAGNOSIS — F3341 Major depressive disorder, recurrent, in partial remission: Secondary | ICD-10-CM

## 2018-02-06 ENCOUNTER — Other Ambulatory Visit: Payer: Self-pay | Admitting: Family Medicine

## 2018-02-06 DIAGNOSIS — E785 Hyperlipidemia, unspecified: Principal | ICD-10-CM

## 2018-02-06 DIAGNOSIS — E1169 Type 2 diabetes mellitus with other specified complication: Secondary | ICD-10-CM

## 2018-02-14 ENCOUNTER — Ambulatory Visit: Payer: Self-pay | Admitting: Family Medicine

## 2018-04-28 ENCOUNTER — Ambulatory Visit
Admission: EM | Admit: 2018-04-28 | Discharge: 2018-04-28 | Disposition: A | Discharge status: Home / Self Care | Payer: Medicare Other | Attending: Family Medicine | Admitting: Family Medicine

## 2018-04-28 ENCOUNTER — Other Ambulatory Visit: Payer: Self-pay

## 2018-04-28 ENCOUNTER — Encounter: Payer: Self-pay | Admitting: Emergency Medicine

## 2018-04-28 DIAGNOSIS — H938X3 Other specified disorders of ear, bilateral: Secondary | ICD-10-CM

## 2018-04-28 DIAGNOSIS — H9191 Unspecified hearing loss, right ear: Secondary | ICD-10-CM

## 2018-04-28 DIAGNOSIS — R42 Dizziness and giddiness: Secondary | ICD-10-CM | POA: Diagnosis not present

## 2018-04-28 MED ORDER — MECLIZINE HCL 25 MG PO TABS: 25.0000 mg | ORAL_TABLET | Freq: Three times a day (TID) | ORAL | 0 refills | Status: DC | PRN

## 2018-04-28 NOTE — ED Triage Notes (Signed)
Pt c/o bilateral ear fullness, pain, tinnitus, dizziness, decreased hearing. Right is worse than left. Started 3 weeks ago.

## 2018-04-28 NOTE — Discharge Instructions (Signed)
Medication as prescribed.  I recommend that you see ENT [facility]).  Take care  Dr. Lacinda Axon

## 2018-04-28 NOTE — ED Provider Notes (Signed)
[facility]    CSN: [id number] Arrival date & time: 04/28/18  1457  History   Chief Complaint Chief Complaint  Patient presents with  . Dizziness  . Ear Fullness   HPI  68 year old female with an extensive past medical history presents with multiple complaints.  Patient reports a 3-week history of bilateral ear fullness, tenderness, dizziness, decreased hearing, congestion.  Patient states that she has right sided hearing loss.  Patient states that she feels unsteady on her feet.  Patient feels that she is impacted with wax.  Requesting to have her ears irrigated.  Patient states that she has seen ENT for similar complaints previously.  States that this is not vertigo as she has had this in the past.  Patient is mainly concerned about the fact that she feels unsteady on her feet.  Denies changes to her regular medications.  No other associated symptoms.  No other complaints.  PMH, Surgical Hx, Family Hx, Social History reviewed and updated as below.  Past Medical History:  Diagnosis Date  . Anemia   . Anxiety   . Depression   . Diabetes mellitus, type II ([place])   . GERD (gastroesophageal reflux disease)   . Headache    stress/sinus; "not frequent" (09/05/2017)  . Hemorrhoids   . High cholesterol   . History of blood transfusion    "S/P hysterectomy"  . Motion sickness   . Multiple thyroid nodules   . Sleep apnea    past hx.  resolved with wt loss (09/05/2017)  . Uterine cancer ([place])   . Vertigo    sinus related    Patient Active Problem List   Diagnosis Date Noted  . Fecal impaction in rectum ([place]) 09/04/2017  . Obesity (BMI 30-39.9) 09/04/2017  . Senile purpura ([place]) 07/25/2017  . Recurrent major depressive disorder, in partial remission ([place]) 07/25/2017  . 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 Head injury 06/30/2016  . White matter abnormality on MRI of brain 05/04/2016  . Iron deficiency anemia   . Fatigue 10/03/2015  . History of gastric bypass 10/03/2015    . Dyslipidemia 10/02/2014  . Carotid artery narrowing 09/25/2014  . Eczema of both hands 09/25/2014  . Benign hypertension 09/25/2014  . Allergic rhinitis, seasonal 09/25/2014  . Diabetes mellitus, type 2 ([place]) 09/05/2014  . Multinodular goiter 01/14/2014  . Dysthymia 10/15/2013  . 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 Difficulty in walking 09/25/2013  . Loss of feeling or sensation 09/25/2013  . Cephalalgia 09/25/2013  . Cervical pain 09/25/2013    Past Surgical History:  Procedure Laterality Date  . ABDOMINAL HERNIA REPAIR  X 2  . BACK SURGERY    . CESAREAN SECTION  1979; 1991  . CHOLECYSTECTOMY OPEN  1972  . COLECTOMY    . COLONOSCOPY WITH PROPOFOL N/A 11/21/2015   Procedure: COLONOSCOPY WITH PROPOFOL;  Surgeon: Lucilla Lame, MD;  Location: [facility];  Service: Endoscopy;  Laterality: N/A;  . ESOPHAGEAL MANOMETRY N/A 12/22/2016   Procedure: ESOPHAGEAL MANOMETRY (EM);  Surgeon: Mauri Pole, MD;  Location: [facility];  Service: Endoscopy;  Laterality: N/A;  . ESOPHAGOGASTRODUODENOSCOPY (EGD) WITH PROPOFOL N/A 11/21/2015   Procedure: ESOPHAGOGASTRODUODENOSCOPY (EGD) WITH PROPOFOL;  Surgeon: Lucilla Lame, MD;  Location: [facility];  Service: Endoscopy;  Laterality: N/A;  Diabetic - oral meds  . FECAL DISIMPACTION  09/05/2017  . GASTRIC BYPASS  2000  . HERNIA REPAIR    . INCISION AND DRAINAGE PERIRECTAL ABSCESS N/A 09/05/2017   Procedure: FECAL DISIMPACTION;  Surgeon:  Rolm Bookbinder, MD;  Location: [facility];  Service: General;  Laterality: N/A;  . LAPAROSCOPIC SMALL BOWEL RESECTION  ~ 2017   "obstruction"  . [place] SURGERY  2010   "L4"  . TONSILLECTOMY  1950s  . VAGINAL HYSTERECTOMY  ~ 2000    OB History   No obstetric history on file.      Home Medications    Prior to Admission medications   Medication Sig Start Date End Date Taking? Authorizing Provider  acetaminophen (TYLENOL) 500 MG tablet Take 500 mg by mouth every 6 (six) hours as  needed for mild pain, moderate pain or headache.   Yes [provider]  desloratadine (CLARINEX) 5 MG tablet Take 1 tablet (5 mg total) by mouth daily. Patient taking differently: Take 5 mg by mouth daily as needed (for allergies).  07/25/17  Yes Sowles, Drue Stager, MD  ergocalciferol (VITAMIN D2) 50000 units capsule Take 1 capsule (50,000 Units total) by mouth once a week. Patient taking differently: Take 50,000 Units by mouth every Saturday.  07/18/17  Yes Sowles, Drue Stager, MD  escitalopram (LEXAPRO) 10 MG tablet TAKE 1/2 TABLET BY MOUTH DAILY 01/30/18  Yes Sowles, Drue Stager, MD  fluticasone (FLONASE) 50 MCG/ACT nasal spray Place 2 sprays into both nostrils daily. Patient taking differently: Place 2 sprays into both nostrils daily as needed for allergies or rhinitis.  05/04/16  Yes Rochel Brome A, MD  metFORMIN (GLUCOPHAGE) 1000 MG tablet TAKE 1/2 TABLETS (500 MG TOTAL) BY MOUTH 2 (TWO) TIMES DAILY WITH A MEAL. 02/06/18  Yes Sowles, Drue Stager, MD  mometasone (ELOCON) 0.1 % ointment Apply topically daily. Patient taking differently: Apply 1 application topically daily as needed (for stress eczema).  10/22/16  Yes Roselee Nova, MD  Multiple Vitamins-Minerals (BARIATRIC FUSION) CHEW Chew 1 tablet by mouth 4 (four) times daily.   Yes [provider]  meclizine (ANTIVERT) 25 MG tablet Take 1 tablet (25 mg total) by mouth 3 (three) times daily as needed for dizziness or nausea. 04/28/18   Coral Spikes, DO  triamcinolone ointment (KENALOG) 0.5 % APPLY TO AFFECTED AREA TWICE A DAY 11/11/17   Steele Sizer, MD    Family History Family History  Problem Relation Age of Onset  . Depression Mother   . Diabetes Mother   . Heart disease Mother   . Heart disease Father   . Diabetes Sister   . Diabetes Brother   . Dementia Brother   . Heart disease Brother   . Diabetes Sister   . Fibromyalgia Sister   . Diabetes Brother   . Heart disease Brother   . Diabetes Brother   . Heart disease  Brother   . Leukemia Brother   . Cancer Brother   . Liver disease Brother   . Alcohol abuse Brother   . Throat cancer Brother   . Heart disease Brother   . Diabetes Brother     Social History Social History   Tobacco Use  . Smoking status: Former Smoker    Packs/day: 1.00    Years: 5.00    Pack years: 5.00    Types: Cigarettes    Start date: 03/08/1962    Last attempt to quit: 1979    Years since quitting: 41.1  . Smokeless tobacco: Never Used  Substance Use Topics  . Alcohol use: Not Currently    Alcohol/week: 0.0 standard drinks  . Drug use: Not Currently     Allergies   Adhesive [tape]; Oxycodone-acetaminophen; Percocet [oxycodone-acetaminophen]; and  Iodinated diagnostic agents   Review of Systems Review of Systems Per HPI  Physical Exam Triage Vital Signs ED Triage Vitals  Enc Vitals Group     BP 04/28/18 1519 127/81     Pulse Rate 04/28/18 1519 69     Resp 04/28/18 1519 18     Temp 04/28/18 1519 97.9 F (36.6 C)     Temp Source 04/28/18 1519 Oral     SpO2 04/28/18 1519 98 %     Weight 04/28/18 1515 175 lb (79.4 kg)     Height 04/28/18 1515 5\' 4"  (1.626 m)     Head Circumference --      Peak Flow --      Pain Score 04/28/18 1514 3     Pain Loc --      Pain Edu? --      Excl. in [place]? --    Updated Vital Signs BP 127/81   Pulse 69   Temp 97.9 F (36.6 C) (Oral)   Resp 18   Ht 5\' 4"  (1.626 m)   Wt 79.4 kg   SpO2 98%   BMI 30.04 kg/m   Visual Acuity Right Eye Distance:   Left Eye Distance:   Bilateral Distance:    Right Eye Near:   Left Eye Near:    Bilateral Near:     Physical Exam Vitals signs and nursing note reviewed.  Constitutional:      General: She is not in acute distress.    Appearance: Normal appearance.  HENT:     Head: Normocephalic and atraumatic.     Right Ear: Tympanic membrane normal.     Left Ear: Tympanic membrane normal.     Nose: Nose normal.  Eyes:     General:        Right eye: No discharge.        Left  eye: No discharge.     Conjunctiva/sclera: Conjunctivae normal.  Cardiovascular:     Rate and Rhythm: Normal rate and regular rhythm.  Pulmonary:     Effort: Pulmonary effort is normal.     Breath sounds: Normal breath sounds.  Neurological:     Mental Status: She is alert.  Psychiatric:        Mood and Affect: Mood normal.        Behavior: Behavior normal.    UC Treatments / Results  Labs (all labs ordered are listed, but only abnormal results are displayed) Labs Reviewed - No data to display  EKG None  Radiology No results found.  Procedures Procedures (including critical care time)  Medications Ordered in UC Medications - No data to display  Initial Impression / Assessment and Plan / UC Course  I have reviewed the triage vital signs and the nursing notes.  Pertinent labs & imaging results that were available during my care of the patient were reviewed by me and considered in my medical decision making (see chart for details).    68 year old female presents with dizziness, sensation of fullness of both ears, and hearing loss.  Examination is unrevealing.  Meclizine as directed.  Needs to see ENT.  Final Clinical Impressions(s) / UC Diagnoses   Final diagnoses:  Dizziness  Sensation of fullness in both ears  Hearing loss of right ear, unspecified hearing loss type     Discharge Instructions     Medication as prescribed.  I recommend that you see ENT [facility]).  Take care  Dr. Lacinda Axon    ED Prescriptions  Medication Sig Dispense Auth. Provider   meclizine (ANTIVERT) 25 MG tablet Take 1 tablet (25 mg total) by mouth 3 (three) times daily as needed for dizziness or nausea. 30 tablet Coral Spikes, DO     Controlled Substance Prescriptions [place] Controlled Substance Registry consulted? Not Applicable   Coral Spikes, DO 04/28/18 1926

## 2018-05-15 ENCOUNTER — Other Ambulatory Visit (HOSPITAL_COMMUNITY): Payer: Self-pay | Admitting: Physician Assistant

## 2018-05-15 ENCOUNTER — Other Ambulatory Visit: Payer: Self-pay | Admitting: Physician Assistant

## 2018-05-15 DIAGNOSIS — H918X1 Other specified hearing loss, right ear: Secondary | ICD-10-CM

## 2018-05-15 DIAGNOSIS — IMO0001 Reserved for inherently not codable concepts without codable children: Secondary | ICD-10-CM

## 2018-05-23 ENCOUNTER — Ambulatory Visit: Payer: Medicare Other

## 2018-05-24 ENCOUNTER — Ambulatory Visit: Payer: Medicare Other

## 2018-05-27 ENCOUNTER — Other Ambulatory Visit: Payer: Self-pay

## 2018-05-27 ENCOUNTER — Ambulatory Visit
Admission: EM | Admit: 2018-05-27 | Discharge: 2018-05-27 | Disposition: A | Discharge status: Home / Self Care | Payer: Medicare Other | Attending: Family Medicine | Admitting: Family Medicine

## 2018-05-27 DIAGNOSIS — R319 Hematuria, unspecified: Secondary | ICD-10-CM | POA: Diagnosis not present

## 2018-05-27 DIAGNOSIS — R300 Dysuria: Secondary | ICD-10-CM | POA: Diagnosis not present

## 2018-05-27 DIAGNOSIS — N390 Urinary tract infection, site not specified: Secondary | ICD-10-CM

## 2018-05-27 LAB — URINALYSIS, COMPLETE (UACMP) WITH MICROSCOPIC
Bilirubin Urine: NEGATIVE
Glucose, UA: NEGATIVE mg/dL
Ketones, ur: NEGATIVE mg/dL
Nitrite: NEGATIVE
Protein, ur: NEGATIVE mg/dL
Specific Gravity, Urine: 1.015 (ref 1.005–1.030)
pH: 7.5 (ref 5.0–8.0)

## 2018-05-27 MED ORDER — CEPHALEXIN 500 MG PO CAPS: 500.0000 mg | ORAL_CAPSULE | Freq: Two times a day (BID) | ORAL | 0 refills | Status: AC

## 2018-05-27 MED ORDER — FLUCONAZOLE 150 MG PO TABS: 150.0000 mg | ORAL_TABLET | Freq: Every day | ORAL | 0 refills | Status: DC

## 2018-05-27 NOTE — ED Provider Notes (Signed)
[facility] ____________________________________________  Time seen: Approximately 10:16 AM  I have reviewed the triage vital signs and the nursing notes.   HISTORY  Chief Complaint Dysuria   HPI Brooke Liu is a 68 y.o. female presenting for evaluation of urinary frequency, urinary urgency, burning with urination since yesterday.  States feels similar to previous urinary tract infections.  Has not taken any over-the-counter medications for the same complaints.  Denies chest pain, shortness of breath, abdominal pain, back pain, flank pain, fevers, vomiting or diarrhea.  Continues to eat and drink well.  Denies other aggravating alleviating factors.  Reports otherwise doing well.  Steele Sizer, MD: PCP    Past Medical History:  Diagnosis Date  . Anemia   . Anxiety   . Depression   . Diabetes mellitus, type II ([place])   . GERD (gastroesophageal reflux disease)   . Headache    stress/sinus; "not frequent" (09/05/2017)  . Hemorrhoids   . High cholesterol   . History of blood transfusion    "S/P hysterectomy"  . Motion sickness   . Multiple thyroid nodules   . Sleep apnea    past hx.  resolved with wt loss (09/05/2017)  . Uterine cancer ([place])   . Vertigo    sinus related    Patient Active Problem List   Diagnosis Date Noted  . Fecal impaction in rectum ([place]) 09/04/2017  . Obesity (BMI 30-39.9) 09/04/2017  . Senile purpura ([place]) 07/25/2017  . Recurrent major depressive disorder, in partial remission ([place]) 07/25/2017  . 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 Head injury 06/30/2016  . White matter abnormality on MRI of brain 05/04/2016  . Iron deficiency anemia   . Fatigue 10/03/2015  . History of gastric bypass 10/03/2015  . Dyslipidemia 10/02/2014  . Carotid artery narrowing 09/25/2014  . Eczema of both hands 09/25/2014  . Benign hypertension 09/25/2014  . Allergic rhinitis, seasonal 09/25/2014  . Diabetes mellitus, type 2 ([place]) 09/05/2014  .  Multinodular goiter 01/14/2014  . Dysthymia 10/15/2013  . 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 Difficulty in walking 09/25/2013  . Loss of feeling or sensation 09/25/2013  . Cephalalgia 09/25/2013  . Cervical pain 09/25/2013    Past Surgical History:  Procedure Laterality Date  . ABDOMINAL HERNIA REPAIR  X 2  . BACK SURGERY    . CESAREAN SECTION  1979; 1991  . CHOLECYSTECTOMY OPEN  1972  . COLECTOMY    . COLONOSCOPY WITH PROPOFOL N/A 11/21/2015   Procedure: COLONOSCOPY WITH PROPOFOL;  Surgeon: Lucilla Lame, MD;  Location: [place];  Service: Endoscopy;  Laterality: N/A;  . ESOPHAGEAL MANOMETRY N/A 12/22/2016   Procedure: ESOPHAGEAL MANOMETRY (EM);  Surgeon: Mauri Pole, MD;  [facility];  Service: Endoscopy;  Laterality: N/A;  . ESOPHAGOGASTRODUODENOSCOPY (EGD) WITH PROPOFOL N/A 11/21/2015   Procedure: ESOPHAGOGASTRODUODENOSCOPY (EGD) WITH PROPOFOL;  Surgeon: Lucilla Lame, MD;  Location: [place];  Service: Endoscopy;  Laterality: N/A;  Diabetic - oral meds  . FECAL DISIMPACTION  09/05/2017  . GASTRIC BYPASS  2000  . HERNIA REPAIR    . INCISION AND DRAINAGE PERIRECTAL ABSCESS N/A 09/05/2017   Procedure: FECAL DISIMPACTION;  Surgeon: Rolm Bookbinder, MD;  Location: [place];  Service: General;  Laterality: N/A;  . LAPAROSCOPIC SMALL BOWEL RESECTION  ~ 2017   "obstruction"  . [place] SURGERY  2010   "L4"  . TONSILLECTOMY  1950s  . VAGINAL HYSTERECTOMY  ~ 2000     No current facility-administered medications for this encounter.  Current Outpatient Medications:  .  acetaminophen (TYLENOL) 500 MG tablet, Take 500 mg by mouth every 6 (six) hours as needed for mild pain, moderate pain or headache., Disp: , Rfl:  .  cephALEXin (KEFLEX) 500 MG capsule, Take 1 capsule (500 mg total) by mouth 2 (two) times daily for 7 days., Disp: 14 capsule, Rfl: 0 .  desloratadine (CLARINEX) 5 MG tablet, Take 1 tablet (5 mg total) by mouth daily. (Patient taking  differently: Take 5 mg by mouth daily as needed (for allergies). ), Disp: 90 tablet, Rfl: 1 .  ergocalciferol (VITAMIN D2) 50000 units capsule, Take 1 capsule (50,000 Units total) by mouth once a week. (Patient taking differently: Take 50,000 Units by mouth every Saturday. ), Disp: 12 capsule, Rfl: 0 .  escitalopram (LEXAPRO) 10 MG tablet, TAKE 1/2 TABLET BY MOUTH DAILY, Disp: 45 tablet, Rfl: 0 .  fluconazole (DIFLUCAN) 150 MG tablet, Take 1 tablet (150 mg total) by mouth daily. Take one pill orally,as needed., Disp: 1 tablet, Rfl: 0 .  fluticasone (FLONASE) 50 MCG/ACT nasal spray, Place 2 sprays into both nostrils daily. (Patient taking differently: Place 2 sprays into both nostrils daily as needed for allergies or rhinitis. ), Disp: 16 g, Rfl: 2 .  meclizine (ANTIVERT) 25 MG tablet, Take 1 tablet (25 mg total) by mouth 3 (three) times daily as needed for dizziness or nausea., Disp: 30 tablet, Rfl: 0 .  metFORMIN (GLUCOPHAGE) 1000 MG tablet, TAKE 1/2 TABLETS (500 MG TOTAL) BY MOUTH 2 (TWO) TIMES DAILY WITH A MEAL., Disp: 90 tablet, Rfl: 0 .  mometasone (ELOCON) 0.1 % ointment, Apply topically daily. (Patient taking differently: Apply 1 application topically daily as needed (for stress eczema). ), Disp: 45 g, Rfl: 0 .  Multiple Vitamins-Minerals (BARIATRIC FUSION) CHEW, Chew 1 tablet by mouth 4 (four) times daily., Disp: , Rfl:  .  triamcinolone ointment (KENALOG) 0.5 %, APPLY TO AFFECTED AREA TWICE A DAY, Disp: 30 g, Rfl: 0  Allergies Adhesive [tape]; Oxycodone-acetaminophen; Percocet [oxycodone-acetaminophen]; and Iodinated diagnostic agents  Family History  Problem Relation Age of Onset  . Depression Mother   . Diabetes Mother   . Heart disease Mother   . Heart disease Father   . Diabetes Sister   . Diabetes Brother   . Dementia Brother   . Heart disease Brother   . Diabetes Sister   . Fibromyalgia Sister   . Diabetes Brother   . Heart disease Brother   . Diabetes Brother   . Heart  disease Brother   . Leukemia Brother   . Cancer Brother   . Liver disease Brother   . Alcohol abuse Brother   . Throat cancer Brother   . Heart disease Brother   . Diabetes Brother     Social History Social History   Tobacco Use  . Smoking status: Former Smoker    Packs/day: 1.00    Years: 5.00    Pack years: 5.00    Types: Cigarettes    Start date: 03/08/1962    Last attempt to quit: 1979    Years since quitting: 41.2  . Smokeless tobacco: Never Used  Substance Use Topics  . Alcohol use: Not Currently    Alcohol/week: 0.0 standard drinks  . Drug use: Not Currently    Review of Systems Constitutional: No fever Cardiovascular: Denies chest pain. Respiratory: Denies shortness of breath. Gastrointestinal: No abdominal pain.  Genitourinary: positive for dysuria. Musculoskeletal: Negative for back pain. Skin: Negative for rash.  ____________________________________________  PHYSICAL EXAM:  VITAL SIGNS: ED Triage Vitals  Enc Vitals Group     BP 05/27/18 0818 (!) 142/88     Pulse Rate 05/27/18 0818 67     Resp 05/27/18 0818 18     Temp 05/27/18 0818 97.8 F (36.6 C)     Temp Source 05/27/18 0818 Oral     SpO2 05/27/18 0818 99 %     Weight 05/27/18 0821 185 lb (83.9 kg)     Height 05/27/18 0821 5' 3.5" (1.613 m)     Head Circumference --      Peak Flow --      Pain Score 05/27/18 0821 10     Pain Loc --      Pain Edu? --      Excl. in [place]? --     Constitutional: Alert and oriented. Well appearing and in no acute distress. ENT      Head: Normocephalic and atraumatic. Cardiovascular: Normal rate, regular rhythm. Grossly normal heart sounds.  Good peripheral circulation. Respiratory: Normal respiratory effort without tachypnea nor retractions. Breath sounds are clear and equal bilaterally. No wheezes, rales, rhonchi. Gastrointestinal: Soft and nontender. No CVA tenderness. Musculoskeletal:  No midline cervical, thoracic or lumbar tenderness to palpation.  Neurologic:  Normal speech and language. Speech is normal. No gait instability.  Skin:  Skin is warm, dry and intact. No rash noted. Psychiatric: Mood and affect are normal. Speech and behavior are normal. Patient exhibits appropriate insight and judgment   ___________________________________________   LABS (all labs ordered are listed, but only abnormal results are displayed)  Labs Reviewed  URINALYSIS, COMPLETE (UACMP) WITH MICROSCOPIC - Abnormal; Notable for the following components:      Result Value   APPearance CLOUDY (*)    Hgb urine dipstick TRACE (*)    Leukocytes,Ua MODERATE (*)    Bacteria, UA MANY (*)    All other components within normal limits  URINE CULTURE   ____________________________________________  PROCEDURES Procedures   INITIAL IMPRESSION / ASSESSMENT AND PLAN / ED COURSE  Pertinent labs & imaging results that were available during my care of the patient were reviewed by me and considered in my medical decision making (see chart for details).  Well-appearing patient.  No acute distress.  Urinalysis reviewed, suspect UTI.  We will culture.  Will treat with oral Keflex.  Patient also requests Diflucan, Rx given.  Encourage rest, please, supportive care.Discussed indication, risks and benefits of medications with patient.  Discussed follow up with Primary care physician this week. Discussed follow up and return parameters including no resolution or any worsening concerns. Patient verbalized understanding and agreed to plan.   ____________________________________________   FINAL CLINICAL IMPRESSION(S) / ED DIAGNOSES  Final diagnoses:  Urinary tract infection with hematuria, site unspecified     ED Discharge Orders         Ordered    cephALEXin (KEFLEX) 500 MG capsule  2 times daily     05/27/18 0859    fluconazole (DIFLUCAN) 150 MG tablet  Daily     05/27/18 1002           Note: This dictation was prepared with Dragon dictation along with  smaller phrase technology. Any transcriptional errors that result from this process are unintentional.         Marylene Land, NP 05/27/18 1059

## 2018-05-27 NOTE — ED Triage Notes (Signed)
Pt with chills 2 days ago, frequency, dysuria

## 2018-05-27 NOTE — Discharge Instructions (Addendum)
Take medication as prescribed. Rest. Drink plenty of fluids.  ° °Follow up with your primary care physician this week as needed. Return to Urgent care for new or worsening concerns.  ° °

## 2018-05-30 LAB — URINE CULTURE: Culture: >=100000 — AB

## 2018-05-31 ENCOUNTER — Telehealth (HOSPITAL_COMMUNITY): Payer: Self-pay | Admitting: Emergency Medicine

## 2018-05-31 NOTE — Telephone Encounter (Signed)
Urine culture was positive for Escherichia coli and was given keflex  at urgent care visit. Pt contacted and made aware, educated on completing antibiotic and to follow up if symptoms are persistent. LVMM

## 2018-06-05 ENCOUNTER — Telehealth (HOSPITAL_COMMUNITY): Payer: Self-pay | Admitting: Emergency Medicine

## 2018-06-05 NOTE — Telephone Encounter (Signed)
Patient called statign she was still having symptoms, per Dr. Mannie Stabile, pt needs to be reseen, pt given information for e visit, as well as encouraged her to follow up with her PCP. High risk of exposure if coming here, pt is agreeable to plan, will try and find a follow up for symptoms that limits her risk of exposure to covid.

## 2018-07-07 ENCOUNTER — Ambulatory Visit (INDEPENDENT_AMBULATORY_CARE_PROVIDER_SITE_OTHER): Payer: Medicare Other | Admitting: Family Medicine

## 2018-07-07 ENCOUNTER — Encounter: Payer: Self-pay | Admitting: Family Medicine

## 2018-07-07 ENCOUNTER — Other Ambulatory Visit: Payer: Self-pay

## 2018-07-07 DIAGNOSIS — F3341 Major depressive disorder, recurrent, in partial remission: Secondary | ICD-10-CM

## 2018-07-07 DIAGNOSIS — E785 Hyperlipidemia, unspecified: Secondary | ICD-10-CM

## 2018-07-07 DIAGNOSIS — E1140 Type 2 diabetes mellitus with diabetic neuropathy, unspecified: Secondary | ICD-10-CM | POA: Diagnosis not present

## 2018-07-07 DIAGNOSIS — B370 Candidal stomatitis: Secondary | ICD-10-CM | POA: Diagnosis not present

## 2018-07-07 DIAGNOSIS — E1169 Type 2 diabetes mellitus with other specified complication: Secondary | ICD-10-CM

## 2018-07-07 DIAGNOSIS — J301 Allergic rhinitis due to pollen: Secondary | ICD-10-CM

## 2018-07-07 DIAGNOSIS — D692 Other nonthrombocytopenic purpura: Secondary | ICD-10-CM

## 2018-07-07 MED ORDER — NYSTATIN 100000 UNIT/ML MT SUSP: 5.0000 mL | Freq: Four times a day (QID) | OROMUCOSAL | 0 refills | Status: DC

## 2018-07-07 NOTE — Progress Notes (Signed)
Name: Brooke Liu   MRN: [id number]    DOB: 01/16/1951   Date:07/07/2018       Progress Note  Subjective  Chief Complaint  Chief Complaint  Patient presents with  . Allergies  . Oral Pain    I connected with  Arrie Senate Nicely  on 07/07/18 at  3:40 PM EDT by a video enabled telemedicine application and verified that I am speaking with the correct person using two identifiers.  I discussed the limitations of evaluation and management by telemedicine and the availability of in person appointments. The patient expressed understanding and agreed to proceed. Staff also discussed with the patient that there may be a patient responsible charge related to this service. Patient Location: at home  Provider Location: [facility]   HPI  Bowel incontinence: she had a severe episode of constipation with impaction back in 08/2017 that required her to go to have manipulation under anesthesia, , she moved to [place] shortly after for a job contract and lost to follow up. Since last year she has bowel urgency and episodes of incontinence. No blood or mucus of stools  Oral mucosa: white patches, not itchy but feels irritated going on for a few days, no fever or chills.   DMII: she states that had bariatric surgery 2002,weight was 265 lbs at the time, was able to come off medication for a long time, she had to resume metformin about 5 years ago. She denies side effects of medication. She is due for hgbA1C, glucose fasting 113-120's- post-prandially around 180's.  She denies polyphagia, polydipsia but has polyuria. She has dyslipidemia and is taking atorvastatin   AR: she stopped medication because she was worried about COVD-19, she has noticed more congestion and rhinorrhea, no fever or chills, sometimes has a cough, advised her to resume allergy medication  Senile purpura; she does not take aspirin every day, states arms always bruised but stable  Vitamin D and B12  deficiency: lasted checked in [place] and is taking supplements now  History of bariatric surgery :she is due for follow up to get her weight   White matter disease: seen by Dr. Melrose Nakayama in the past, she states she had recurrence of balance problems but is doing better now. She saw ENT, had initial testing and was supposed to go back for MRI, but has been postponed secondary to COVID-19  Patient Active Problem List   Diagnosis Date Noted  . Fecal impaction in rectum ([place]) 09/04/2017  . Obesity (BMI 30-39.9) 09/04/2017  . Senile purpura ([place]) 07/25/2017  . Recurrent major depressive disorder, in partial remission ([place]) 07/25/2017  . 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 Head injury 06/30/2016  . White matter abnormality on MRI of brain 05/04/2016  . Iron deficiency anemia   . Fatigue 10/03/2015  . History of gastric bypass 10/03/2015  . Dyslipidemia 10/02/2014  . Carotid artery narrowing 09/25/2014  . Eczema of both hands 09/25/2014  . Benign hypertension 09/25/2014  . Allergic rhinitis, seasonal 09/25/2014  . Diabetes mellitus, type 2 ([place]) 09/05/2014  . Multinodular goiter 01/14/2014  . Dysthymia 10/15/2013  . 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 Difficulty in walking 09/25/2013  . Loss of feeling or sensation 09/25/2013  . Cephalalgia 09/25/2013  . Cervical pain 09/25/2013    Past Surgical History:  Procedure Laterality Date  . ABDOMINAL HERNIA REPAIR  X 2  . BACK SURGERY    . CESAREAN SECTION  1979; 1991  . CHOLECYSTECTOMY OPEN  1972  . COLECTOMY    .  COLONOSCOPY WITH PROPOFOL N/A 11/21/2015   Procedure: COLONOSCOPY WITH PROPOFOL;  Surgeon: Lucilla Lame, MD;  Location: [place];  Service: Endoscopy;  Laterality: N/A;  . ESOPHAGEAL MANOMETRY N/A 12/22/2016   Procedure: ESOPHAGEAL MANOMETRY (EM);  Surgeon: Mauri Pole, MD;  [facility];  Service: Endoscopy;  Laterality: N/A;  . ESOPHAGOGASTRODUODENOSCOPY (EGD) WITH PROPOFOL N/A 11/21/2015   Procedure:  ESOPHAGOGASTRODUODENOSCOPY (EGD) WITH PROPOFOL;  Surgeon: Lucilla Lame, MD;  Location: [place];  Service: Endoscopy;  Laterality: N/A;  Diabetic - oral meds  . FECAL DISIMPACTION  09/05/2017  . GASTRIC BYPASS  2000  . HERNIA REPAIR    . INCISION AND DRAINAGE PERIRECTAL ABSCESS N/A 09/05/2017   Procedure: FECAL DISIMPACTION;  Surgeon: Rolm Bookbinder, MD;  Location: [place];  Service: General;  Laterality: N/A;  . LAPAROSCOPIC SMALL BOWEL RESECTION  ~ 2017   "obstruction"  . [place] SURGERY  2010   "L4"  . TONSILLECTOMY  1950s  . VAGINAL HYSTERECTOMY  ~ 2000    Family History  Problem Relation Age of Onset  . Depression Mother   . Diabetes Mother   . Heart disease Mother   . Heart disease Father   . Diabetes Sister   . Diabetes Brother   . Dementia Brother   . Heart disease Brother   . Diabetes Sister   . Fibromyalgia Sister   . Diabetes Brother   . Heart disease Brother   . Diabetes Brother   . Heart disease Brother   . Leukemia Brother   . Cancer Brother   . Liver disease Brother   . Alcohol abuse Brother   . Throat cancer Brother   . Heart disease Brother   . Diabetes Brother     Social History   Socioeconomic History  . Marital status: Widowed    Spouse name: Not on file  . Number of children: Not on file  . Years of education: Not on file  . Highest education level: Not on file  Occupational History  . Not on file  Social Needs  . Financial resource strain: Not on file  . Food insecurity:    Worry: Not on file    Inability: Not on file  . Transportation needs:    Medical: Not on file    Non-medical: Not on file  Tobacco Use  . Smoking status: Former Smoker    Packs/day: 1.00    Years: 5.00    Pack years: 5.00    Types: Cigarettes    Start date: 03/08/1962    Last attempt to quit: 1979    Years since quitting: 41.3  . Smokeless tobacco: Never Used  Substance and Sexual Activity  . Alcohol use: Not Currently    Alcohol/week: 0.0  standard drinks  . Drug use: Not Currently  . Sexual activity: Not Currently  Lifestyle  . Physical activity:    Days per week: Not on file    Minutes per session: Not on file  . Stress: Not on file  Relationships  . Social connections:    Talks on phone: Not on file    Gets together: Not on file    Attends religious service: Not on file    Active member of club or organization: Not on file    Attends meetings of clubs or organizations: Not on file    Relationship status: Not on file  . Intimate partner violence:    Fear of current or ex partner: Not on file  Emotionally abused: Not on file    Physically abused: Not on file    Forced sexual activity: Not on file  Other Topics Concern  . Not on file  Social History Narrative   She moved to [place] from Summer 2019 till Dec 2019 , she saw another provider there but is back now.      Current Outpatient Medications:  .  acetaminophen (TYLENOL) 500 MG tablet, Take 500 mg by mouth every 6 (six) hours as needed for mild pain, moderate pain or headache., Disp: , Rfl:  .  desloratadine (CLARINEX) 5 MG tablet, Take 1 tablet (5 mg total) by mouth daily. (Patient taking differently: Take 5 mg by mouth daily as needed (for allergies). ), Disp: 90 tablet, Rfl: 1 .  escitalopram (LEXAPRO) 10 MG tablet, TAKE 1/2 TABLET BY MOUTH DAILY, Disp: 45 tablet, Rfl: 0 .  fluticasone (FLONASE) 50 MCG/ACT nasal spray, Place 2 sprays into both nostrils daily. (Patient taking differently: Place 2 sprays into both nostrils daily as needed for allergies or rhinitis. ), Disp: 16 g, Rfl: 2 .  metFORMIN (GLUCOPHAGE) 1000 MG tablet, TAKE 1/2 TABLETS (500 MG TOTAL) BY MOUTH 2 (TWO) TIMES DAILY WITH A MEAL., Disp: 90 tablet, Rfl: 0 .  Multiple Vitamins-Minerals (BARIATRIC FUSION) CHEW, Chew 1 tablet by mouth 4 (four) times daily., Disp: , Rfl:  .  triamcinolone ointment (KENALOG) 0.5 %, APPLY TO AFFECTED AREA TWICE A DAY, Disp: 30 g, Rfl: 0 .  ergocalciferol  (VITAMIN D2) 50000 units capsule, Take 1 capsule (50,000 Units total) by mouth once a week. (Patient not taking: Reported on 07/07/2018), Disp: 12 capsule, Rfl: 0 .  nystatin (MYCOSTATIN) 100000 UNIT/ML suspension, Take 5 mLs (500,000 Units total) by mouth 4 (four) times daily., Disp: 60 mL, Rfl: 0  Allergies  Allergen Reactions  . Adhesive [Tape] Other (See Comments)    Patient's skin is VERY THIN and TEARS VERY EASILY!! PLEASE USE COBAN WRAP!!  . Oxycodone-Acetaminophen Hives and Itching    Very itchy underneath the skin  . Percocet [Oxycodone-Acetaminophen] Hives and Itching    Very itchy underneath the skin  . Iodinated Diagnostic Agents Itching and Rash    MRI Dye    I personally reviewed active problem list, medication list, allergies, family history, social history with the patient/caregiver today.   ROS  Ten systems reviewed and is negative except as mentioned in HPI   Objective  Virtual encounter, vitals not obtained.  There is no height or weight on file to calculate BMI.  Physical Exam  Awake, alert and oriented    PHQ2/9: Depression screen [id number] 2/9 07/07/2018 07/25/2017 03/16/2017 12/02/2016 10/27/2016  Decreased Interest 0 0 0 0 0  Down, Depressed, Hopeless 0 0 0 0 0  PHQ - 2 Score 0 0 0 0 0  Altered sleeping 0 3 - - -  Tired, decreased energy 0 3 - - -  Change in appetite 0 0 - - -  Feeling bad or failure about yourself  0 0 - - -  Trouble concentrating 0 0 - - -  Moving slowly or fidgety/restless 0 0 - - -  Suicidal thoughts 0 0 - - -  PHQ-9 Score 0 6 - - -  Difficult doing work/chores - Not difficult at all - - -   PHQ-2/9 Result is negative.    Fall Risk: Fall Risk  07/25/2017 03/16/2017 12/02/2016 10/27/2016 06/30/2016  Falls in the past year? Yes No Yes Yes Yes  Number falls in past  yr: 2 or more - 1 2 or more 2 or more  Injury with Fall? Yes - No Yes Yes  Comment - - - fracture pulled neck and back muscles  Risk for fall due to : - - - - Impaired  balance/gait  Risk for fall due to: Comment - - - - -  Follow up - - - - Follow up appointment     Assessment & Plan  1. Controlled type 2 diabetes with neuropathy ([place])  Needs to have labs, but she want to wait a couple of months to come in, she had labs in [place] in Nov and states it was controlled  2. Oral candidiasis  Noticed white coating on gums a few days ago, unable to see on video but we will try nystatin I thin - nystatin (MYCOSTATIN) 100000 UNIT/ML suspension; Take 5 mLs (500,000 Units total) by mouth 4 (four) times daily.  Dispense: 60 mL; Refill: 0  3. Recurrent major depressive disorder, in  remission [facility])  Doing well on Lexapro and still has medications at home  4. Senile purpura ([facility])  Stable  5. Seasonal allergic rhinitis due to pollen  Resume medication   6. Dyslipidemia associated with type 2 diabetes mellitus ([place])  Needs labs she will return in 2 months for follow up  I discussed the assessment and treatment plan with the patient. The patient was provided an opportunity to ask questions and all were answered. The patient agreed with the plan and demonstrated an understanding of the instructions.  The patient was advised to call back or seek an in-person evaluation if the symptoms worsen or if the condition fails to improve as anticipated.  I provided 25 minutes of non-face-to-face time during this encounter.

## 2018-07-18 ENCOUNTER — Telehealth: Payer: Self-pay

## 2018-07-18 ENCOUNTER — Other Ambulatory Visit: Payer: Self-pay | Admitting: Family Medicine

## 2018-07-18 MED ORDER — MAGIC MOUTHWASH W/LIDOCAINE: 5.0000 mL | Freq: Four times a day (QID) | ORAL | 0 refills | Status: DC

## 2018-07-18 NOTE — Telephone Encounter (Signed)
Copied from [place] [id number]. Topic: General - Other >> Jul 18, 2018  7:19 AM Carolyn Stare wrote:  Pt call to say hr mouth is no better and is asking if something else can be called in for her , would like a call back

## 2018-07-19 ENCOUNTER — Telehealth: Payer: Self-pay | Admitting: Family Medicine

## 2018-07-19 NOTE — Telephone Encounter (Signed)
Called and left vm in regards to mouthwash.

## 2018-07-19 NOTE — Telephone Encounter (Signed)
Called patient and lvm about mouthwash called into pharmacy. See Dentist or ENT if symptoms don't improve.

## 2018-07-19 NOTE — Telephone Encounter (Signed)
Copied from [place] [id number]. Topic: General - Other >> Jul 18, 2018  5:40 PM Yvette Rack wrote: Reason for CRM: Pt stated she contacted her pharmacy and was advised that no Rx was sent in. Pt also would like a call back to discuss whether or not she should even be using another mouth wash.

## 2018-08-07 ENCOUNTER — Ambulatory Visit (INDEPENDENT_AMBULATORY_CARE_PROVIDER_SITE_OTHER): Payer: Medicare Other | Admitting: Family Medicine

## 2018-08-07 ENCOUNTER — Encounter: Payer: Self-pay | Admitting: Family Medicine

## 2018-08-07 ENCOUNTER — Ambulatory Visit: Payer: Self-pay

## 2018-08-07 DIAGNOSIS — E785 Hyperlipidemia, unspecified: Secondary | ICD-10-CM | POA: Diagnosis not present

## 2018-08-07 DIAGNOSIS — J0110 Acute frontal sinusitis, unspecified: Secondary | ICD-10-CM

## 2018-08-07 DIAGNOSIS — E1169 Type 2 diabetes mellitus with other specified complication: Secondary | ICD-10-CM | POA: Diagnosis not present

## 2018-08-07 MED ORDER — AZITHROMYCIN 500 MG PO TABS: ORAL_TABLET | ORAL | 0 refills | Status: DC

## 2018-08-07 NOTE — Progress Notes (Signed)
Name: Brooke Liu   MRN: [id number]    DOB: 05-Sep-1950   Date:08/07/2018       Progress Note  Subjective  Chief Complaint  Chief Complaint  Patient presents with  . Sinusitis    x 2.5 weeks and it has worsened over the past couple days. Has not treated symptoms because she is afraid it will turn into bronchitis.    I connected with  Arrie Senate Nicely  on 08/07/18 at  1:40 PM EDT by a video enabled telemedicine application and verified that I am speaking with the correct person using two identifiers.  I discussed the limitations of evaluation and management by telemedicine and the availability of in person appointments. The patient expressed understanding and agreed to proceed. Staff also discussed with the patient that there may be a patient responsible charge related to this service. Patient Location: at home  Provider Location: [facility]  HPI  Sinusitis: patient states she has been sick for two weeks , she states initially she tried allergy medication but now having daily frontal headaches, facial pressure, post-nasal drainage that causes a morning cough and some chills. She has noticed mild drop in appetite. She has not been able to monitor her temperature. She denies SOB or wheezing. She states Zpack works well for her. She has noticed glucose going up to 200 over the past few days.    Patient Active Problem List   Diagnosis Date Noted  . Controlled type 2 diabetes with neuropathy ([place]) 07/07/2018  . Fecal impaction in rectum ([place]) 09/04/2017  . Obesity (BMI 30-39.9) 09/04/2017  . Senile purpura ([place]) 07/25/2017  . Recurrent major depressive disorder, in partial remission ([place]) 07/25/2017  . 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 Head injury 06/30/2016  . White matter abnormality on MRI of brain 05/04/2016  . Iron deficiency anemia   . Fatigue 10/03/2015  . History of gastric bypass 10/03/2015  . Dyslipidemia 10/02/2014  . Carotid artery narrowing  09/25/2014  . Eczema of both hands 09/25/2014  . Benign hypertension 09/25/2014  . Allergic rhinitis, seasonal 09/25/2014  . Diabetes mellitus, type 2 ([place]) 09/05/2014  . Multinodular goiter 01/14/2014  . Dysthymia 10/15/2013  . 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 Difficulty in walking 09/25/2013  . Loss of feeling or sensation 09/25/2013  . Cephalalgia 09/25/2013  . Cervical pain 09/25/2013    Past Surgical History:  Procedure Laterality Date  . ABDOMINAL HERNIA REPAIR  X 2  . BACK SURGERY    . CESAREAN SECTION  1979; 1991  . CHOLECYSTECTOMY OPEN  1972  . COLECTOMY    . COLONOSCOPY WITH PROPOFOL N/A 11/21/2015   Procedure: COLONOSCOPY WITH PROPOFOL;  Surgeon: Lucilla Lame, MD;  Location: [place];  Service: Endoscopy;  Laterality: N/A;  . ESOPHAGEAL MANOMETRY N/A 12/22/2016   Procedure: ESOPHAGEAL MANOMETRY (EM);  Surgeon: Mauri Pole, MD;  Location: [facility];  Service: Endoscopy;  Laterality: N/A;  . ESOPHAGOGASTRODUODENOSCOPY (EGD) WITH PROPOFOL N/A 11/21/2015   Procedure: ESOPHAGOGASTRODUODENOSCOPY (EGD) WITH PROPOFOL;  Surgeon: Lucilla Lame, MD;  Location: [place];  Service: Endoscopy;  Laterality: N/A;  Diabetic - oral meds  . FECAL DISIMPACTION  09/05/2017  . GASTRIC BYPASS  2000  . HERNIA REPAIR    . INCISION AND DRAINAGE PERIRECTAL ABSCESS N/A 09/05/2017   Procedure: FECAL DISIMPACTION;  Surgeon: Rolm Bookbinder, MD;  Location: [place];  Service: General;  Laterality: N/A;  . LAPAROSCOPIC SMALL BOWEL RESECTION  ~ 2017   "obstruction"  . LUMBAR  [place] SURGERY  2010   "L4"  . TONSILLECTOMY  1950s  . VAGINAL HYSTERECTOMY  ~ 2000    Family History  Problem Relation Age of Onset  . Depression Mother   . Diabetes Mother   . Heart disease Mother   . Heart disease Father   . Diabetes Sister   . Diabetes Brother   . Dementia Brother   . Heart disease Brother   . Diabetes Sister   . Fibromyalgia Sister   . Diabetes Brother   . Heart  disease Brother   . Diabetes Brother   . Heart disease Brother   . Leukemia Brother   . Cancer Brother   . Liver disease Brother   . Alcohol abuse Brother   . Throat cancer Brother   . Heart disease Brother   . Diabetes Brother     Social History   Socioeconomic History  . Marital status: Widowed    Spouse name: Not on file  . Number of children: Not on file  . Years of education: Not on file  . Highest education level: Not on file  Occupational History  . Not on file  Social Needs  . Financial resource strain: Not on file  . Food insecurity:    Worry: Not on file    Inability: Not on file  . Transportation needs:    Medical: Not on file    Non-medical: Not on file  Tobacco Use  . Smoking status: Former Smoker    Packs/day: 1.00    Years: 5.00    Pack years: 5.00    Types: Cigarettes    Start date: 03/08/1962    Last attempt to quit: 1979    Years since quitting: 41.4  . Smokeless tobacco: Never Used  Substance and Sexual Activity  . Alcohol use: Not Currently    Alcohol/week: 0.0 standard drinks  . Drug use: Not Currently  . Sexual activity: Not Currently  Lifestyle  . Physical activity:    Days per week: Not on file    Minutes per session: Not on file  . Stress: Not on file  Relationships  . Social connections:    Talks on phone: Not on file    Gets together: Not on file    Attends religious service: Not on file    Active member of club or organization: Not on file    Attends meetings of clubs or organizations: Not on file    Relationship status: Not on file  . Intimate partner violence:    Fear of current or ex partner: Not on file    Emotionally abused: Not on file    Physically abused: Not on file    Forced sexual activity: Not on file  Other Topics Concern  . Not on file  Social History Narrative   She moved to [place] from Summer 2019 till Dec 2019 , she saw another provider there but is back now.      Current Outpatient Medications:  .   acetaminophen (TYLENOL) 500 MG tablet, Take 500 mg by mouth every 6 (six) hours as needed for mild pain, moderate pain or headache., Disp: , Rfl:  .  Cyanocobalamin (B-12) 2500 MCG SUBL, TAKE 1 TABLET UNDER THE TONGUE EVERY DAY, Disp: , Rfl:  .  desloratadine (CLARINEX) 5 MG tablet, Take 1 tablet (5 mg total) by mouth daily. (Patient taking differently: Take 5 mg by mouth daily as needed (for allergies). ), Disp: 90 tablet, Rfl: 1 .  ergocalciferol (  VITAMIN D2) 50000 units capsule, Take 1 capsule (50,000 Units total) by mouth once a week., Disp: 12 capsule, Rfl: 0 .  escitalopram (LEXAPRO) 10 MG tablet, TAKE 1/2 TABLET BY MOUTH DAILY, Disp: 45 tablet, Rfl: 0 .  fluticasone (FLONASE) 50 MCG/ACT nasal spray, Place 2 sprays into both nostrils daily. (Patient taking differently: Place 2 sprays into both nostrils daily as needed for allergies or rhinitis. ), Disp: 16 g, Rfl: 2 .  magic mouthwash w/lidocaine SOLN, Take 5 mLs by mouth 4 (four) times daily., Disp: 120 mL, Rfl: 0 .  metFORMIN (GLUCOPHAGE) 1000 MG tablet, TAKE 1/2 TABLETS (500 MG TOTAL) BY MOUTH 2 (TWO) TIMES DAILY WITH A MEAL., Disp: 90 tablet, Rfl: 0 .  Multiple Vitamins-Minerals (BARIATRIC FUSION) CHEW, Chew 1 tablet by mouth 4 (four) times daily., Disp: , Rfl:  .  nystatin (MYCOSTATIN) 100000 UNIT/ML suspension, Take 5 mLs (500,000 Units total) by mouth 4 (four) times daily., Disp: 60 mL, Rfl: 0 .  triamcinolone ointment (KENALOG) 0.5 %, APPLY TO AFFECTED AREA TWICE A DAY, Disp: 30 g, Rfl: 0  Allergies  Allergen Reactions  . Adhesive [Tape] Other (See Comments)    Patient's skin is VERY THIN and TEARS VERY EASILY!! PLEASE USE COBAN WRAP!!  . Oxycodone-Acetaminophen Hives and Itching    Very itchy underneath the skin  . Percocet [Oxycodone-Acetaminophen] Hives and Itching    Very itchy underneath the skin  . Iodinated Diagnostic Agents Itching and Rash    MRI Dye    I personally reviewed active problem list, medication list,  allergies, family history, social history with the patient/caregiver today.   ROS  Ten systems reviewed and is negative except as mentioned in HPI   Objective  Virtual encounter, vitals not obtained.  There is no height or weight on file to calculate BMI.  Physical Exam  Awake, alert and oriented, tender to palpation of frontal sinus  PHQ2/9: Depression screen [facility] 2/9 08/07/2018 07/07/2018 07/25/2017 03/16/2017 12/02/2016  Decreased Interest 0 0 0 0 0  Down, Depressed, Hopeless 0 0 0 0 0  PHQ - 2 Score 0 0 0 0 0  Altered sleeping 0 0 3 - -  Tired, decreased energy 0 0 3 - -  Change in appetite 0 0 0 - -  Feeling bad or failure about yourself  0 0 0 - -  Trouble concentrating 0 0 0 - -  Moving slowly or fidgety/restless 0 0 0 - -  Suicidal thoughts 0 0 0 - -  PHQ-9 Score 0 0 6 - -  Difficult doing work/chores - - Not difficult at all - -   PHQ-2/9 Result is negative.    Fall Risk: Fall Risk  08/07/2018 07/25/2017 03/16/2017 12/02/2016 10/27/2016  Falls in the past year? 1 Yes No Yes Yes  Number falls in past yr: 1 2 or more - 1 2 or more  Injury with Fall? 0 Yes - No Yes  Comment - - - - fracture  Risk for fall due to : - - - - -  Risk for fall due to: Comment - - - - -  Follow up - - - - -     Assessment & Plan  1. Acute non-recurrent frontal sinusitis  - azithromycin (ZITHROMAX) 500 MG tablet; Take with food daily  Dispense: 3 tablet; Refill: 0 Explained we can try a Tripack instead of Zpack, also fluids and mucinex . Advised to check temperature  2. Dyslipidemia associated with type 2 diabetes mellitus (  [place])  Discussed importance of following a strict diabetic diet and let me know if gets worse  I discussed the assessment and treatment plan with the patient. The patient was provided an opportunity to ask questions and all were answered. The patient agreed with the plan and demonstrated an understanding of the instructions.  The patient was advised to call back or seek an  in-person evaluation if the symptoms worsen or if the condition fails to improve as anticipated.  I provided 15 minutes of non-face-to-face time during this encounter.

## 2018-08-07 NOTE — Telephone Encounter (Signed)
Incoming call from Patient who complains of productive cough of yell mucous, runny nose which is sore and is sore.  Bleeds occasionally bleeds Reports headache  .  Onset was 2 1/2 weeks ago  Denies fever reports chills occasionally.blows out blood sometimes.  Sputum is  Yellow.  Reports headache and runny nose.  Transferred Pt. To [place] to make appointment.  Reviewed porotocol and provided care advice .  Reason for Disposition . [1] Continuous (nonstop) coughing interferes with work or school AND [2] no improvement using cough treatment per Care Advice  Answer Assessment - Initial Assessment Questions 1. ONSET: "When did the cough begin?"      2 1/2  weeks 2. SEVERITY: "How bad is the cough today?"      Close to severe 3. RESPIRATORY DISTRESS: "Describe your breathing."      fine 4. FEVER: "Do you have a fever?" If so, ask: "What is your temperature, how was it measured, and when did it start?"     Denies chills off and on 5. SPUTUM: "Describe the color of your sputum" (clear, white, yellow, green)     yeollow 6. HEMOPTYSIS: "Are you coughing up any blood?" If so ask: "How much?" (flecks, streaks, tablespoons, etc.)    yellow 7. CARDIAC HISTORY: "Do you have any history of heart disease?" (e.g., heart attack, congestive heart failure)    d enies 8. LUNG HISTORY: "Do you have any history of lung disease?"  (e.g., pulmonary embolus, asthma, emphysema)     denies 9. PE RISK FACTORS: "Do you have a history of blood clots?" (or: recent major surgery, recent prolonged travel, bedridden)     denies 10. OTHER SYMPTOMS: "Do you have any other symptoms?" (e.g., runny nose, wheezing, chest pain)       Runny nose, headache 11. PREGNANCY: "Is there any chance you are pregnant?" "When was your last menstrual period?"       na 12. TRAVEL: "Have you traveled out of the country in the last month?" (e.g., travel history, exposures)      deniies  Protocols used: [place]

## 2018-09-06 ENCOUNTER — Telehealth: Payer: Self-pay | Admitting: Family Medicine

## 2018-09-06 NOTE — Telephone Encounter (Signed)
Copied from [place] [id number]. Topic: Referral - Request for Referral >> Sep 06, 2018  2:53 PM Ivar Drape wrote: Has patient seen PCP for this complaint?  YES *If NO, is insurance requiring patient see PCP for this issue before PCP can refer them? Referral for which specialty:  Sleep Study Facility Preferred provider/office:  [facility], [place], [place] Reason for referral: Diagnosed w/severe Sleep Apnea 53yrs yrs ago

## 2018-09-06 NOTE — Telephone Encounter (Signed)
Patient stated it is time for her to have her 99mth labs for her gastro bypass.  Need orders put in Epic.  She also stated that she is very fatigue and missing work because of it.

## 2018-09-07 NOTE — Telephone Encounter (Signed)
Patient will come in for appt for sleep apnea and for follow up labs for gastro bypass.

## 2018-09-13 ENCOUNTER — Ambulatory Visit (INDEPENDENT_AMBULATORY_CARE_PROVIDER_SITE_OTHER): Payer: Medicare Other | Admitting: Family Medicine

## 2018-09-13 ENCOUNTER — Other Ambulatory Visit: Payer: Self-pay

## 2018-09-13 ENCOUNTER — Encounter: Payer: Self-pay | Admitting: Family Medicine

## 2018-09-13 VITALS — BP 136/84 | HR 85 | Temp 97.3°F | Resp 16 | Ht 62.0 in | Wt 185.1 lb

## 2018-09-13 DIAGNOSIS — Z862 Personal history of diseases of the blood and blood-forming organs and certain disorders involving the immune mechanism: Secondary | ICD-10-CM | POA: Diagnosis not present

## 2018-09-13 DIAGNOSIS — Z9884 Bariatric surgery status: Secondary | ICD-10-CM

## 2018-09-13 DIAGNOSIS — E559 Vitamin D deficiency, unspecified: Secondary | ICD-10-CM | POA: Diagnosis not present

## 2018-09-13 DIAGNOSIS — E538 Deficiency of other specified B group vitamins: Secondary | ICD-10-CM | POA: Diagnosis not present

## 2018-09-13 DIAGNOSIS — E1169 Type 2 diabetes mellitus with other specified complication: Secondary | ICD-10-CM | POA: Diagnosis not present

## 2018-09-13 DIAGNOSIS — R5383 Other fatigue: Secondary | ICD-10-CM

## 2018-09-13 DIAGNOSIS — E785 Hyperlipidemia, unspecified: Secondary | ICD-10-CM | POA: Diagnosis not present

## 2018-09-13 DIAGNOSIS — Z23 Encounter for immunization: Secondary | ICD-10-CM

## 2018-09-13 DIAGNOSIS — E049 Nontoxic goiter, unspecified: Secondary | ICD-10-CM

## 2018-09-13 DIAGNOSIS — D692 Other nonthrombocytopenic purpura: Secondary | ICD-10-CM | POA: Diagnosis not present

## 2018-09-13 DIAGNOSIS — M8580 Other specified disorders of bone density and structure, unspecified site: Secondary | ICD-10-CM

## 2018-09-13 DIAGNOSIS — F3342 Major depressive disorder, recurrent, in full remission: Secondary | ICD-10-CM

## 2018-09-13 DIAGNOSIS — Z1231 Encounter for screening mammogram for malignant neoplasm of breast: Secondary | ICD-10-CM

## 2018-09-13 DIAGNOSIS — G4733 Obstructive sleep apnea (adult) (pediatric): Secondary | ICD-10-CM | POA: Diagnosis not present

## 2018-09-13 MED ORDER — EMPAGLIFLOZIN 10 MG PO TABS: 10.0000 mg | ORAL_TABLET | Freq: Every day | ORAL | 0 refills | Status: DC

## 2018-09-13 MED ORDER — ESCITALOPRAM OXALATE 10 MG PO TABS: 5.0000 mg | ORAL_TABLET | Freq: Every day | ORAL | 1 refills | Status: DC

## 2018-09-13 NOTE — Progress Notes (Signed)
Name: Brooke Liu   MRN: [id number]    DOB: November 13, 1950   Date:09/13/2018       Progress Note  Subjective  Chief Complaint  Chief Complaint  Patient presents with  . Labs Only    Would like to go to [facility] and medication refills  . Referral    Before gastic bypass she was diagnosis with sleep apnea  . Fatigue    Wakes up with a red face and constantly exhausted with 12 hours a night and wakes up and will still take a 2 hour nap  . Snoring    Gasping for breath at night her daughter states    HPI  OSA: she used CPAP machine prior to bariatric surgery in 2002, and after surgery she came off the CPAP machine, but over the past year she has gained weight, chocking at night, waking herself up during the night, can sleep all night but wake up very tired, also needs to nap in the afternoon and had a headache in the mornings.   DMII: she has dyslipidemia, she is currently on Metformin but afraid of possible side effects, we discussed options and we will try changing to [facility], she still likes candy bars but trying to do better with her diet, denies polyphagia, polydipsia or polyuria.   Goiter: she has not seen endo in a long time, we will refer her back and check TSH  History of bariatric surgery: she went down to 155 lbs but gradually gaining it back, we will check labs, history of iron deficiency anemia and had iron infusion in the past and is feeling very tired lately  B12 and Vitamin D deficiency: she has not been taking supplements consistently and we will recheck labs.   Depression: in remission, normal phq 9 , taking lexapro daily   Patient Active Problem List   Diagnosis Date Noted  . Controlled type 2 diabetes with neuropathy ([place]) 07/07/2018  . Fecal impaction in rectum ([place]) 09/04/2017  . Obesity (BMI 30-39.9) 09/04/2017  . Senile purpura ([place]) 07/25/2017  . Recurrent major depressive disorder, in partial remission ([place]) 07/25/2017  . Iron malabsorption  01/18/2017  . Head injury 06/30/2016  . White matter abnormality on MRI of brain 05/04/2016  . Iron deficiency anemia   . Fatigue 10/03/2015  . History of gastric bypass 10/03/2015  . Dyslipidemia 10/02/2014  . Carotid artery narrowing 09/25/2014  . Eczema of both hands 09/25/2014  . Benign hypertension 09/25/2014  . Allergic rhinitis, seasonal 09/25/2014  . Diabetes mellitus, type 2 ([place]) 09/05/2014  . Multinodular goiter 01/14/2014  . Dysthymia 10/15/2013  . 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 Difficulty in walking 09/25/2013  . Loss of feeling or sensation 09/25/2013  . Cephalalgia 09/25/2013  . Cervical pain 09/25/2013    Past Surgical History:  Procedure Laterality Date  . ABDOMINAL HERNIA REPAIR  X 2  . BACK SURGERY    . CESAREAN SECTION  1979; 1991  . CHOLECYSTECTOMY OPEN  1972  . COLECTOMY    . COLONOSCOPY WITH PROPOFOL N/A 11/21/2015   Procedure: COLONOSCOPY WITH PROPOFOL;  Surgeon: Lucilla Lame, MD;  Location: [facility];  Service: Endoscopy;  Laterality: N/A;  . ESOPHAGEAL MANOMETRY N/A 12/22/2016   Procedure: ESOPHAGEAL MANOMETRY (EM);  Surgeon: Mauri Pole, MD;  Location: [facility];  Service: Endoscopy;  Laterality: N/A;  . ESOPHAGOGASTRODUODENOSCOPY (EGD) WITH PROPOFOL N/A 11/21/2015   Procedure: ESOPHAGOGASTRODUODENOSCOPY (EGD) WITH PROPOFOL;  Surgeon: Lucilla Lame, MD;  Location: [facility];  Service: Endoscopy;  Laterality: N/A;  Diabetic - oral meds  . FECAL DISIMPACTION  09/05/2017  . GASTRIC BYPASS  2000  . HERNIA REPAIR    . INCISION AND DRAINAGE PERIRECTAL ABSCESS N/A 09/05/2017   Procedure: FECAL DISIMPACTION;  Surgeon: Rolm Bookbinder, MD;  Location: [facility];  Service: General;  Laterality: N/A;  . LAPAROSCOPIC SMALL BOWEL RESECTION  ~ 2017   "obstruction"  . [place] SURGERY  2010   "L4"  . TONSILLECTOMY  1950s  . VAGINAL HYSTERECTOMY  ~ 2000    Family History  Problem Relation Age of Onset  . Depression Mother   .  Diabetes Mother   . Heart disease Mother   . Heart disease Father   . Diabetes Sister   . Diabetes Brother   . Dementia Brother   . Heart disease Brother   . Diabetes Sister   . Fibromyalgia Sister   . Diabetes Brother   . Heart disease Brother   . Diabetes Brother   . Heart disease Brother   . Leukemia Brother   . Cancer Brother   . Liver disease Brother   . Alcohol abuse Brother   . Throat cancer Brother   . Heart disease Brother   . Diabetes Brother     Social History   Socioeconomic History  . Marital status: Widowed    Spouse name: Not on file  . Number of children: 2  . Years of education: Not on file  . Highest education level: Associate degree: academic program  Occupational History  . Not on file  Social Needs  . Financial resource strain: Not hard at all  . Food insecurity    Worry: Never true    Inability: Never true  . Transportation needs    Medical: No    Non-medical: No  Tobacco Use  . Smoking status: Former Smoker    Packs/day: 1.00    Years: 5.00    Pack years: 5.00    Types: Cigarettes    Start date: 03/08/1962    Quit date: 1979    Years since quitting: 41.5  . Smokeless tobacco: Never Used  Substance and Sexual Activity  . Alcohol use: Not Currently    Alcohol/week: 0.0 standard drinks  . Drug use: Not Currently  . Sexual activity: Not Currently  Lifestyle  . Physical activity    Days per week: 5 days    Minutes per session: 30 min  . Stress: Not at all  Relationships  . Social connections    Talks on phone: More than three times a week    Gets together: More than three times a week    Attends religious service: More than 4 times per year    Active member of club or organization: No    Attends meetings of clubs or organizations: Never    Relationship status: Widowed  . Intimate partner violence    Fear of current or ex partner: No    Emotionally abused: No    Physically abused: No    Forced sexual activity: No  Other Topics  Concern  . Not on file  Social History Narrative   She moved to [place] from Summer 2019 till Dec 2019 , she saw another provider there but is back now.      Current Outpatient Medications:  .  acetaminophen (TYLENOL) 500 MG tablet, Take 500 mg by mouth every 6 (six) hours as needed for mild pain, moderate pain or headache., Disp: , Rfl:  .  Cyanocobalamin (B-12) 2500 MCG SUBL, TAKE 1 TABLET UNDER THE TONGUE EVERY DAY, Disp: , Rfl:  .  ergocalciferol (VITAMIN D2) 50000 units capsule, Take 1 capsule (50,000 Units total) by mouth once a week., Disp: 12 capsule, Rfl: 0 .  escitalopram (LEXAPRO) 10 MG tablet, Take 0.5 tablets (5 mg total) by mouth daily., Disp: 90 tablet, Rfl: 1 .  fluticasone (FLONASE) 50 MCG/ACT nasal spray, Place 2 sprays into both nostrils daily. (Patient taking differently: Place 2 sprays into both nostrils daily as needed for allergies or rhinitis. ), Disp: 16 g, Rfl: 2 .  Multiple Vitamins-Minerals (BARIATRIC FUSION) CHEW, Chew 1 tablet by mouth 4 (four) times daily., Disp: , Rfl:  .  nystatin (MYCOSTATIN) 100000 UNIT/ML suspension, Take 5 mLs (500,000 Units total) by mouth 4 (four) times daily., Disp: 60 mL, Rfl: 0 .  triamcinolone ointment (KENALOG) 0.5 %, APPLY TO AFFECTED AREA TWICE A DAY, Disp: 30 g, Rfl: 0 .  empagliflozin (JARDIANCE) 10 MG TABS tablet, Take 10 mg by mouth daily., Disp: 30 tablet, Rfl: 0  Allergies  Allergen Reactions  . Adhesive [Tape] Other (See Comments)    Patient's skin is VERY THIN and TEARS VERY EASILY!! PLEASE USE COBAN WRAP!!  . Oxycodone-Acetaminophen Hives and Itching    Very itchy underneath the skin  . Percocet [Oxycodone-Acetaminophen] Hives and Itching    Very itchy underneath the skin  . Iodinated Diagnostic Agents Itching and Rash    MRI Dye    I personally reviewed active problem list, medication list, allergies, family history, social history with the patient/caregiver today.   ROS  Constitutional: Negative for fever or  weight change.  Respiratory: Negative for cough and shortness of breath.   Cardiovascular: Negative for chest pain or palpitations.  Gastrointestinal: Negative for abdominal pain, no bowel changes.  Musculoskeletal: Negative for gait problem or joint swelling.  Skin: Negative for rash.  Neurological: Negative for dizziness , positive for morning  headache.  No other specific complaints in a complete review of systems (except as listed in HPI above).  Objective  Vitals:   09/13/18 1134  BP: 136/84  Pulse: 85  Resp: 16  Temp: (!) 97.3 F (36.3 C)  TempSrc: Temporal  SpO2: 97%  Weight: 185 lb 1.6 oz (84 kg)  Height: 5\' 2"  (1.575 m)    Body mass index is 33.86 kg/m.  Physical Exam  Constitutional: Patient appears well-developed and well-nourished. Obese  No distress.  HEENT: head atraumatic, normocephalic, pupils equal and reactive to light, neck supple Cardiovascular: Normal rate, regular rhythm and normal heart sounds.  No murmur heard. No BLE edema. Pulmonary/Chest: Effort normal and breath sounds normal. No respiratory distress. Abdominal: Soft.  There is no tenderness. Psychiatric: Patient has a normal mood and affect. behavior is normal. Judgment and thought content normal.  PHQ2/9: Depression screen [facility] 2/9 09/13/2018 08/07/2018 07/07/2018 07/25/2017 03/16/2017  Decreased Interest 0 0 0 0 0  Down, Depressed, Hopeless 0 0 0 0 0  PHQ - 2 Score 0 0 0 0 0  Altered sleeping 0 0 0 3 -  Tired, decreased energy 0 0 0 3 -  Change in appetite 0 0 0 0 -  Feeling bad or failure about yourself  0 0 0 0 -  Trouble concentrating 0 0 0 0 -  Moving slowly or fidgety/restless 0 0 0 0 -  Suicidal thoughts 0 0 0 0 -  PHQ-9 Score 0 0 0 6 -  Difficult doing work/chores Not difficult at all - -  Not difficult at all -    phq 9 is negative   Fall Risk: Fall Risk  09/13/2018 08/07/2018 07/25/2017 03/16/2017 12/02/2016  Falls in the past year? 1 1 Yes No Yes  Number falls in past yr: 1 1 2  or more -  1  Injury with Fall? 0 0 Yes - No  Comment - - - - -  Risk for fall due to : - - - - -  Risk for fall due to: Comment - - - - -  Follow up - - - - -    Functional Status Survey: Is the patient deaf or have difficulty hearing?: No Does the patient have difficulty seeing, even when wearing glasses/contacts?: No Does the patient have difficulty concentrating, remembering, or making decisions?: No Does the patient have difficulty walking or climbing stairs?: No Does the patient have difficulty dressing or bathing?: No Does the patient have difficulty doing errands alone such as visiting a doctor's office or shopping?: No    Assessment & Plan  1. OSA (obstructive sleep apnea)  - Ambulatory referral to Sleep Studies  2. Recurrent major depressive disorder, remission (HCC)  - escitalopram (LEXAPRO) 10 MG tablet; Take 0.5 tablets (5 mg total) by mouth daily.  Dispense: 90 tablet; Refill: 1  3. Vitamin D deficiency  - VITAMIN D 25 Hydroxy (Vit-D Deficiency, Fractures)  4. Vitamin B12 deficiency  - B12  5. Senile purpura (HCC)  stable  6. Dyslipidemia associated with type 2 diabetes mellitus (HCC)  - COMPLETE METABOLIC PANEL WITH GFR - Lipid panel - Hemoglobin A1c - Microalbumin / creatinine urine ratio - empagliflozin (JARDIANCE) 10 MG TABS tablet; Take 10 mg by mouth daily.  Dispense: 30 tablet; Refill: 0  7. Dyslipidemia  - Lipid panel  8. Fatigue, unspecified type  - CBC with Differential/Platelet - Iron, TIBC and Ferritin Panel  9. History of iron deficiency anemia  - CBC with Differential/Platelet  10. Goiter  - TSH - Ambulatory referral to Endocrinology  11. History of gastric bypass   12. Osteopenia, unspecified location  - DG Bone Density; Future  13. Encounter for screening mammogram for breast cancer  - MM Digital Screening; Future  14. Need for pneumococcal vaccination  - Pneumococcal conjugate vaccine 13-valent IM

## 2018-09-14 LAB — COMPLETE METABOLIC PANEL WITH GFR
AG Ratio: 1.4 (calc) (ref 1.0–2.5)
ALT: 15 U/L (ref 6–29)
AST: 19 U/L (ref 10–35)
Albumin: 4 g/dL (ref 3.6–5.1)
Alkaline phosphatase (APISO): 78 U/L (ref 37–153)
BUN: 15 mg/dL (ref 7–25)
CO2: 28 mmol/L (ref 20–32)
Calcium: 9.1 mg/dL (ref 8.6–10.4)
Chloride: 105 mmol/L (ref 98–110)
Creat: 0.66 mg/dL (ref 0.50–0.99)
GFR, Est African American: 107 mL/min/{1.73_m2} (ref >=60)
GFR, Est Non African American: 92 mL/min/{1.73_m2} (ref >=60)
Globulin: 2.9 g/dL (calc) (ref 1.9–3.7)
Glucose, Bld: 112 mg/dL — ABNORMAL HIGH (ref 65–99)
Potassium: 4.8 mmol/L (ref 3.5–5.3)
Sodium: 138 mmol/L (ref 135–146)
Total Bilirubin: 0.4 mg/dL (ref 0.2–1.2)
Total Protein: 6.9 g/dL (ref 6.1–8.1)

## 2018-09-14 LAB — CBC WITH DIFFERENTIAL/PLATELET
Absolute Monocytes: 612 cells/uL (ref 200–950)
Basophils Absolute: 58 cells/uL (ref 0–200)
Basophils Relative: 0.8 %
Eosinophils Absolute: 209 cells/uL (ref 15–500)
Eosinophils Relative: 2.9 %
HCT: 38 % (ref 35.0–45.0)
Hemoglobin: 12.5 g/dL (ref 11.7–15.5)
Lymphs Abs: 1685 cells/uL (ref 850–3900)
MCH: 26.4 pg — ABNORMAL LOW (ref 27.0–33.0)
MCHC: 32.9 g/dL (ref 32.0–36.0)
MCV: 80.3 fL (ref 80.0–100.0)
MPV: 9.4 fL (ref 7.5–12.5)
Monocytes Relative: 8.5 %
Neutro Abs: 4637 cells/uL (ref 1500–7800)
Neutrophils Relative %: 64.4 %
Platelets: 286 10*3/uL (ref 140–400)
RBC: 4.73 10*6/uL (ref 3.80–5.10)
RDW: 14.9 % (ref 11.0–15.0)
Total Lymphocyte: 23.4 %
WBC: 7.2 10*3/uL (ref 3.8–10.8)

## 2018-09-14 LAB — MICROALBUMIN / CREATININE URINE RATIO
Creatinine, Urine: 96 mg/dL (ref 20–275)
Microalb Creat Ratio: 17 mcg/mg creat (ref <30)
Microalb, Ur: 1.6 mg/dL

## 2018-09-14 LAB — LIPID PANEL
Cholesterol: 198 mg/dL (ref <200)
HDL: 41 mg/dL — ABNORMAL LOW (ref >=50)
LDL Cholesterol (Calc): 128 mg/dL (calc) — ABNORMAL HIGH
Non-HDL Cholesterol (Calc): 157 mg/dL (calc) — ABNORMAL HIGH (ref <130)
Total CHOL/HDL Ratio: 4.8 (calc) (ref <5.0)
Triglycerides: 172 mg/dL — ABNORMAL HIGH (ref <150)

## 2018-09-14 LAB — HEMOGLOBIN A1C
Hgb A1c MFr Bld: 6.4 % of total Hgb — ABNORMAL HIGH (ref <5.7)
Mean Plasma Glucose: 137 (calc)
eAG (mmol/L): 7.6 (calc)

## 2018-09-14 LAB — VITAMIN D 25 HYDROXY (VIT D DEFICIENCY, FRACTURES): Vit D, 25-Hydroxy: 14 ng/mL — ABNORMAL LOW (ref 30–100)

## 2018-09-14 LAB — IRON,TIBC AND FERRITIN PANEL
%SAT: 9 % (calc) — ABNORMAL LOW (ref 16–45)
Ferritin: 10 ng/mL — ABNORMAL LOW (ref 16–288)
Iron: 29 ug/dL — ABNORMAL LOW (ref 45–160)
TIBC: 324 mcg/dL (calc) (ref 250–450)

## 2018-09-14 LAB — VITAMIN B12: Vitamin B-12: 1323 pg/mL — ABNORMAL HIGH (ref 200–1100)

## 2018-09-14 LAB — TSH: TSH: 0.4 mIU/L (ref 0.40–4.50)

## 2018-09-25 ENCOUNTER — Telehealth: Payer: Self-pay

## 2018-09-25 DIAGNOSIS — E559 Vitamin D deficiency, unspecified: Secondary | ICD-10-CM

## 2018-09-25 MED ORDER — ERGOCALCIFEROL 1.25 MG (50000 UT) PO CAPS: 50000.0000 [IU] | ORAL_CAPSULE | ORAL | 0 refills | Status: DC

## 2018-09-25 MED ORDER — FERROUS SULFATE 325 (65 FE) MG PO TBEC: 325.0000 mg | DELAYED_RELEASE_TABLET | Freq: Every day | ORAL | 0 refills | Status: DC

## 2018-09-25 NOTE — Telephone Encounter (Signed)
Copied from [place] [id number]. Topic: General - Other >> Sep 22, 2018  3:44 PM Keene Breath wrote: Reason for CRM: Patient called to ask the doctor about her Vitamin D and Iron prescription.  Patient stated that there has been nothing sent to the pharmacy.  Please advise and call patient to let her know at [phone]

## 2018-09-25 NOTE — Addendum Note (Signed)
Addended by: Loistine Chance on: 09/25/2018 08:27 PM   Modules accepted: Orders

## 2018-09-28 ENCOUNTER — Telehealth: Payer: Self-pay | Admitting: Family Medicine

## 2018-09-28 NOTE — Telephone Encounter (Signed)
Copied from [place] [id number]. Topic: Quick Communication - Rx Refill/Question >> Sep 28, 2018  4:33 PM Erick Blinks wrote: Medication: empagliflozin (JARDIANCE) 10 MG TABS tablet - Pt called to state that this has not reduced her blood sugar. She has to take two a day. She is requesting refill of 25 MG tablets because pharmacy stated that her insurance will help cover this supply. Please advise, CRM created   Has the patient contacted their pharmacy? Yes.   (Agent: If no, request that the patient contact the pharmacy for the refill.) (Agent: If yes, when and what did the pharmacy advise?)  Preferred Pharmacy (with phone number or street name): [id number] - [place], [place] [address] Phone: [phone] Fax: [phone]    Agent: Please be advised that RX refills may take up to 3 business days. We ask that you follow-up with your pharmacy.

## 2018-09-29 ENCOUNTER — Other Ambulatory Visit: Payer: Self-pay | Admitting: Family Medicine

## 2018-09-29 DIAGNOSIS — E1169 Type 2 diabetes mellitus with other specified complication: Secondary | ICD-10-CM

## 2018-09-29 MED ORDER — FLUCONAZOLE 150 MG PO TABS: 150.0000 mg | ORAL_TABLET | ORAL | 0 refills | Status: DC

## 2018-09-29 MED ORDER — EMPAGLIFLOZIN 10 MG PO TABS: 10.0000 mg | ORAL_TABLET | Freq: Every day | ORAL | 0 refills | Status: DC

## 2018-09-29 MED ORDER — METFORMIN HCL 500 MG PO TABS: 500.0000 mg | ORAL_TABLET | Freq: Two times a day (BID) | ORAL | 0 refills | Status: DC

## 2018-09-29 NOTE — Telephone Encounter (Signed)
States Jardiance 10 mg is not controlling her sugar it has been around 250-275 around 3 times a week for the past 2 weeks. Also she is having a horrible yeast infection and would like something for it. She states if you want to put her back on Metformin 500 mg bid again and she discuss the Jardiance in her next visit she is ok with that. She just doesn't like her sugar being that high and she is thirsty all the time and is eating less sugar then before. She is just worried since she is about to be out of Jardiance by Sunday.

## 2018-09-29 NOTE — Telephone Encounter (Signed)
Copied from [place] [id number]. Topic: General - Call Back - No Documentation >> Sep 28, 2018  4:30 PM Erick Blinks wrote: Reason for CRM: Pt called to request nurse call back. Regarding new Rx, pt is out of medication. Pt states the medication has not reduced her A1C. Jardiance 10 mg. Pt states that the pharmacy will pay for 25 mg tablets. She is requesting a refill of this as soon as possible.

## 2018-10-17 ENCOUNTER — Telehealth: Payer: Self-pay | Admitting: Family Medicine

## 2018-10-17 NOTE — Telephone Encounter (Signed)
Pt stated that she has been taking the metFORMIN (GLUCOPHAGE) 500 MG tablet For two weeks and due to it being a different manufacturer than her previous rx for metFORMIN 1000MG , it has caused her to be sick on her stomach again. She would like for Dr. Ancil Boozer to switch her back to the 1000MG  which she breaks in half. Please advise.  [id number] - [address], [place] [phone] (Phone) [phone] (Fax)

## 2018-10-18 NOTE — Telephone Encounter (Signed)
Her pharmacy has it in stock.

## 2018-10-18 NOTE — Telephone Encounter (Signed)
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

## 2018-10-20 DIAGNOSIS — G4733 Obstructive sleep apnea (adult) (pediatric): Secondary | ICD-10-CM | POA: Diagnosis not present

## 2018-10-21 ENCOUNTER — Other Ambulatory Visit: Payer: Self-pay | Admitting: Family Medicine

## 2018-10-23 ENCOUNTER — Other Ambulatory Visit: Payer: Self-pay | Admitting: Physician Assistant

## 2018-10-23 DIAGNOSIS — H9191 Unspecified hearing loss, right ear: Secondary | ICD-10-CM

## 2018-11-03 ENCOUNTER — Telehealth: Payer: Self-pay | Admitting: Family Medicine

## 2018-11-03 DIAGNOSIS — G4733 Obstructive sleep apnea (adult) (pediatric): Secondary | ICD-10-CM | POA: Diagnosis not present

## 2018-11-03 NOTE — Telephone Encounter (Signed)
LVM for patient to call our office in regards to the endocrinology referral that was placed on 09/13/2018.   [facility] in [place] [phone] (o) sent our office a fax on 11/03/2018 @ 10:47 AM stating that their office has tried several times to contact patient in regards to setting up an appointment, but have not received a call back from patient.

## 2018-11-03 NOTE — Telephone Encounter (Signed)
Pt states that she will call [facility] to schedule appointment

## 2018-11-17 ENCOUNTER — Other Ambulatory Visit: Payer: Self-pay | Admitting: Family Medicine

## 2018-11-21 DIAGNOSIS — R42 Dizziness and giddiness: Secondary | ICD-10-CM | POA: Diagnosis not present

## 2018-11-22 ENCOUNTER — Other Ambulatory Visit: Payer: Self-pay

## 2018-11-22 ENCOUNTER — Other Ambulatory Visit: Payer: Self-pay | Admitting: Physician Assistant

## 2018-11-22 ENCOUNTER — Ambulatory Visit
Admission: RE | Admit: 2018-11-22 | Discharge: 2018-11-22 | Disposition: A | Discharge status: Home / Self Care | Payer: Medicare Other | Source: Ambulatory Visit | Attending: Physician Assistant | Admitting: Physician Assistant

## 2018-11-22 ENCOUNTER — Other Ambulatory Visit: Payer: Self-pay | Admitting: Family Medicine

## 2018-11-22 DIAGNOSIS — E785 Hyperlipidemia, unspecified: Secondary | ICD-10-CM

## 2018-11-22 DIAGNOSIS — E1169 Type 2 diabetes mellitus with other specified complication: Secondary | ICD-10-CM

## 2018-11-22 DIAGNOSIS — H9191 Unspecified hearing loss, right ear: Secondary | ICD-10-CM | POA: Insufficient documentation

## 2018-11-23 ENCOUNTER — Other Ambulatory Visit: Payer: Medicare Other

## 2018-11-23 ENCOUNTER — Inpatient Hospital Stay: Admission: RE | Admit: 2018-11-23 | Payer: Medicare Other | Source: Ambulatory Visit

## 2018-11-24 DIAGNOSIS — R42 Dizziness and giddiness: Secondary | ICD-10-CM | POA: Diagnosis not present

## 2018-11-24 DIAGNOSIS — H903 Sensorineural hearing loss, bilateral: Secondary | ICD-10-CM | POA: Diagnosis not present

## 2018-11-24 DIAGNOSIS — I672 Cerebral atherosclerosis: Secondary | ICD-10-CM | POA: Diagnosis not present

## 2018-11-24 DIAGNOSIS — J329 Chronic sinusitis, unspecified: Secondary | ICD-10-CM | POA: Diagnosis not present

## 2018-11-24 DIAGNOSIS — R51 Headache: Secondary | ICD-10-CM | POA: Diagnosis not present

## 2018-11-27 ENCOUNTER — Encounter: Payer: Self-pay | Admitting: Family Medicine

## 2018-11-27 DIAGNOSIS — I672 Cerebral atherosclerosis: Secondary | ICD-10-CM | POA: Insufficient documentation

## 2018-11-27 DIAGNOSIS — H905 Unspecified sensorineural hearing loss: Secondary | ICD-10-CM | POA: Insufficient documentation

## 2018-11-28 ENCOUNTER — Encounter: Payer: Self-pay | Admitting: Family Medicine

## 2018-11-30 DIAGNOSIS — G4733 Obstructive sleep apnea (adult) (pediatric): Secondary | ICD-10-CM | POA: Diagnosis not present

## 2018-12-04 ENCOUNTER — Encounter: Payer: Self-pay | Admitting: Family Medicine

## 2018-12-04 DIAGNOSIS — G4733 Obstructive sleep apnea (adult) (pediatric): Secondary | ICD-10-CM | POA: Insufficient documentation

## 2018-12-05 ENCOUNTER — Other Ambulatory Visit: Payer: Self-pay

## 2018-12-05 ENCOUNTER — Ambulatory Visit (INDEPENDENT_AMBULATORY_CARE_PROVIDER_SITE_OTHER): Payer: Medicare Other | Admitting: Family Medicine

## 2018-12-05 ENCOUNTER — Encounter: Payer: Self-pay | Admitting: Family Medicine

## 2018-12-05 DIAGNOSIS — Z87891 Personal history of nicotine dependence: Secondary | ICD-10-CM | POA: Diagnosis not present

## 2018-12-05 DIAGNOSIS — Z8249 Family history of ischemic heart disease and other diseases of the circulatory system: Secondary | ICD-10-CM

## 2018-12-05 DIAGNOSIS — E1169 Type 2 diabetes mellitus with other specified complication: Secondary | ICD-10-CM | POA: Diagnosis not present

## 2018-12-05 DIAGNOSIS — E7800 Pure hypercholesterolemia, unspecified: Secondary | ICD-10-CM

## 2018-12-05 DIAGNOSIS — E1140 Type 2 diabetes mellitus with diabetic neuropathy, unspecified: Secondary | ICD-10-CM | POA: Diagnosis not present

## 2018-12-05 DIAGNOSIS — E785 Hyperlipidemia, unspecified: Secondary | ICD-10-CM

## 2018-12-05 MED ORDER — METFORMIN HCL 1000 MG PO TABS: 1000.0000 mg | ORAL_TABLET | Freq: Two times a day (BID) | ORAL | 0 refills | Status: DC

## 2018-12-05 MED ORDER — EZETIMIBE 10 MG PO TABS: 10.0000 mg | ORAL_TABLET | Freq: Every day | ORAL | 0 refills | Status: DC

## 2018-12-05 NOTE — Progress Notes (Signed)
Name: Brooke Liu   MRN: [id number]    DOB: November 14, 1950   Date:12/05/2018       Progress Note  Subjective  Chief Complaint  Chief Complaint  Patient presents with  . Diabetes    She would like to be put back on the medication that she was on previously.  . Depression  . Hyperlipidemia  . Sinusitis    Drainage. She has been taking [place].    I connected with  Arrie Senate Nicely  on 12/05/18 at 11:00 AM EDT by a video enabled telemedicine application and verified that I am speaking with the correct person using two identifiers.  I discussed the limitations of evaluation and management by telemedicine and the availability of in person appointments. The patient expressed understanding and agreed to proceed. Staff also discussed with the patient that there may be a patient responsible charge related to this service. Patient Location: at home Provider Location: [facility]   HPI  DMII: she has dyslipidemia, she is currently on Metformin 500 mg one in am and two at night, however she states the tablets are round and causes upset stomach, she wants to go back to metformin 1000 mg to take one and a half to two daily. She states her diet has not changed but glucose has been up lately, spiking to 200's at times. She denies polyuria, polyphagia or polydipsia.   Hyperlipidemia: she states statin therapy caused diarrhea in the past, discussed options and she is willing to try Zetia.   Sinus pressure: she denies exposure to COVID-19 , fever or chills, states just post-nasal drainage now and is gradually getting better with otc medication.   Inbalance and hearing loss: seen by ENT,had MRI brain and is doing better now.   Family history of abdominal aorta aneurysm: she contacted her insurance and states it will be covered to have screen done, she was a smoker, her sister had abdominal aorta aneurysm   Patient Active Problem List   Diagnosis Date Noted  . OSA  (obstructive sleep apnea) 12/04/2018  . Sensorineural hearing loss, unilateral, unrestrictd contralateral side 11/27/2018  . Cerebral atherosclerosis 11/27/2018  . Controlled type 2 diabetes with neuropathy ([place]) 07/07/2018  . Fecal impaction in rectum ([place]) 09/04/2017  . Obesity (BMI 30-39.9) 09/04/2017  . Senile purpura ([place]) 07/25/2017  . Recurrent major depressive disorder, in partial remission ([place]) 07/25/2017  . 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 Head injury 06/30/2016  . White matter abnormality on MRI of brain 05/04/2016  . Iron deficiency anemia   . Fatigue 10/03/2015  . History of gastric bypass 10/03/2015  . Dyslipidemia 10/02/2014  . Carotid artery narrowing 09/25/2014  . Eczema of both hands 09/25/2014  . Benign hypertension 09/25/2014  . Allergic rhinitis, seasonal 09/25/2014  . Diabetes mellitus, type 2 ([place]) 09/05/2014  . Multinodular goiter 01/14/2014  . Dysthymia 10/15/2013  . 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 Difficulty in walking 09/25/2013  . Loss of feeling or sensation 09/25/2013  . Cephalalgia 09/25/2013  . Cervical pain 09/25/2013    Past Surgical History:  Procedure Laterality Date  . ABDOMINAL HERNIA REPAIR  X 2  . BACK SURGERY    . CESAREAN SECTION  1979; 1991  . CHOLECYSTECTOMY OPEN  1972  . COLECTOMY    . COLONOSCOPY WITH PROPOFOL N/A 11/21/2015   Procedure: COLONOSCOPY WITH PROPOFOL;  Surgeon: Lucilla Lame, MD;  Location: [place];  Service: Endoscopy;  Laterality: N/A;  . ESOPHAGEAL MANOMETRY N/A 12/22/2016   Procedure:  ESOPHAGEAL MANOMETRY (EM);  Surgeon: Mauri Pole, MD;  Location: [facility];  Service: Endoscopy;  Laterality: N/A;  . ESOPHAGOGASTRODUODENOSCOPY (EGD) WITH PROPOFOL N/A 11/21/2015   Procedure: ESOPHAGOGASTRODUODENOSCOPY (EGD) WITH PROPOFOL;  Surgeon: Lucilla Lame, MD;  Location: [place];  Service: Endoscopy;  Laterality: N/A;  Diabetic - oral meds  . FECAL DISIMPACTION  09/05/2017  . GASTRIC BYPASS   2000  . HERNIA REPAIR    . INCISION AND DRAINAGE PERIRECTAL ABSCESS N/A 09/05/2017   Procedure: FECAL DISIMPACTION;  Surgeon: Rolm Bookbinder, MD;  Location: [place];  Service: General;  Laterality: N/A;  . LAPAROSCOPIC SMALL BOWEL RESECTION  ~ 2017   "obstruction"  . [place] SURGERY  2010   "L4"  . TONSILLECTOMY  1950s  . VAGINAL HYSTERECTOMY  ~ 2000    Family History  Problem Relation Age of Onset  . Depression Mother   . Diabetes Mother   . Heart disease Mother   . Heart disease Father   . Diabetes Sister   . Diabetes Brother   . Dementia Brother   . Heart disease Brother   . Diabetes Sister   . Fibromyalgia Sister   . Diabetes Brother   . Heart disease Brother   . Diabetes Brother   . Heart disease Brother   . Leukemia Brother   . Cancer Brother   . Liver disease Brother   . Alcohol abuse Brother   . Throat cancer Brother   . Heart disease Brother   . Diabetes Brother     Social History   Socioeconomic History  . Marital status: Widowed    Spouse name: Not on file  . Number of children: 2  . Years of education: Not on file  . Highest education level: Associate degree: academic program  Occupational History  . Not on file  Social Needs  . Financial resource strain: Not hard at all  . Food insecurity    Worry: Never true    Inability: Never true  . Transportation needs    Medical: No    Non-medical: No  Tobacco Use  . Smoking status: Former Smoker    Packs/day: 1.00    Years: 5.00    Pack years: 5.00    Types: Cigarettes    Start date: 03/08/1962    Quit date: 1979    Years since quitting: 41.7  . Smokeless tobacco: Never Used  Substance and Sexual Activity  . Alcohol use: Not Currently    Alcohol/week: 0.0 standard drinks  . Drug use: Not Currently  . Sexual activity: Not Currently  Lifestyle  . Physical activity    Days per week: 5 days    Minutes per session: 30 min  . Stress: Not at all  Relationships  . Social connections    Talks  on phone: More than three times a week    Gets together: More than three times a week    Attends religious service: More than 4 times per year    Active member of club or organization: No    Attends meetings of clubs or organizations: Never    Relationship status: Widowed  . Intimate partner violence    Fear of current or ex partner: No    Emotionally abused: No    Physically abused: No    Forced sexual activity: No  Other Topics Concern  . Not on file  Social History Narrative   She moved to [place] from Summer 2019 till Dec 2019 , she saw  another provider there but is back now.      Current Outpatient Medications:  .  acetaminophen (TYLENOL) 500 MG tablet, Take 500 mg by mouth every 6 (six) hours as needed for mild pain, moderate pain or headache., Disp: , Rfl:  .  Cyanocobalamin (B-12) 2500 MCG SUBL, TAKE 1 TABLET UNDER THE TONGUE EVERY DAY, Disp: , Rfl:  .  ergocalciferol (VITAMIN D2) 1.25 MG (50000 UT) capsule, Take 1 capsule (50,000 Units total) by mouth once a week., Disp: 12 capsule, Rfl: 0 .  escitalopram (LEXAPRO) 10 MG tablet, Take 0.5 tablets (5 mg total) by mouth daily., Disp: 90 tablet, Rfl: 1 .  ferrous sulfate 325 (65 FE) MG EC tablet, Take 1 tablet (325 mg total) by mouth daily with breakfast., Disp: 90 tablet, Rfl: 0 .  fluticasone (FLONASE) 50 MCG/ACT nasal spray, Place 2 sprays into both nostrils daily. (Patient taking differently: Place 2 sprays into both nostrils daily as needed for allergies or rhinitis. ), Disp: 16 g, Rfl: 2 .  JARDIANCE 10 MG TABS tablet, TAKE 10 MG BY MOUTH DAILY., Disp: 30 tablet, Rfl: 0 .  metFORMIN (GLUCOPHAGE) 500 MG tablet, TAKE 1-2 TABLETS (500-1,000 MG TOTAL) BY MOUTH 2 (TWO) TIMES DAILY WITH A MEAL. 1 IN AM AND 2 IN PM, Disp: 60 tablet, Rfl: 0 .  mometasone (ELOCON) 0.1 % ointment, APPLY TO AFFECTED AREA(S) OF HANDS DAILY. ALTERNATE W/ TAC CREAM.DONT USE SAME TIME/SAME DAY, Disp: , Rfl:  .  Multiple Vitamins-Minerals (BARIATRIC FUSION)  CHEW, Chew 1 tablet by mouth 4 (four) times daily., Disp: , Rfl:  .  triamcinolone ointment (KENALOG) 0.5 %, APPLY TO AFFECTED AREA TWICE A DAY, Disp: 30 g, Rfl: 0  Allergies  Allergen Reactions  . Adhesive [Tape] Other (See Comments)    Patient's skin is VERY THIN and TEARS VERY EASILY!! PLEASE USE COBAN WRAP!!  . Oxycodone-Acetaminophen Hives and Itching    Very itchy underneath the skin  . Percocet [Oxycodone-Acetaminophen] Hives and Itching    Very itchy underneath the skin  . Iodinated Diagnostic Agents Itching and Rash    MRI Dye    I personally reviewed active problem list, medication list, allergies, family history, social history, health maintenance with the patient/caregiver today.   ROS  Ten systems reviewed and is negative except as mentioned in HPI   Objective  Virtual encounter, vitals not obtained.  There is no height or weight on file to calculate BMI.  Physical Exam  Awake, alert and oriented   PHQ2/9: Depression screen [facility] 2/9 12/05/2018 09/13/2018 08/07/2018 07/07/2018 07/25/2017  Decreased Interest 0 0 0 0 0  Down, Depressed, Hopeless 0 0 0 0 0  PHQ - 2 Score 0 0 0 0 0  Altered sleeping 0 0 0 0 3  Tired, decreased energy 0 0 0 0 3  Change in appetite 0 0 0 0 0  Feeling bad or failure about yourself  0 0 0 0 0  Trouble concentrating 0 0 0 0 0  Moving slowly or fidgety/restless 0 0 0 0 0  Suicidal thoughts 0 0 0 0 0  PHQ-9 Score 0 0 0 0 6  Difficult doing work/chores - Not difficult at all - - Not difficult at all   PHQ-2/9 Result is negative.    Fall Risk: Fall Risk  12/05/2018 09/13/2018 08/07/2018 07/25/2017 03/16/2017  Falls in the past year? 0 1 1 Yes No  Number falls in past yr: 0 1 1 2  or more -  Injury  with Fall? 0 0 0 Yes -  Comment - - - - -  Risk for fall due to : - - - - -  Risk for fall due to: Comment - - - - -  Follow up - - - - -     Assessment & Plan  1. Dyslipidemia associated with type 2 diabetes mellitus (HCC)  - metFORMIN  (GLUCOPHAGE) 1000 MG tablet; Take 1 tablet (1,000 mg total) by mouth 2 (two) times daily with a meal.  Dispense: 180 tablet; Refill: 0   2. Controlled type 2 diabetes with neuropathy (HCC)  - metFORMIN (GLUCOPHAGE) 1000 MG tablet; Take 1 tablet (1,000 mg total) by mouth 2 (two) times daily with a meal.  Dispense: 180 tablet; Refill: 0  3. Pure hypercholesterolemia  - ezetimibe (ZETIA) 10 MG tablet; Take 1 tablet (10 mg total) by mouth daily.  Dispense: 90 tablet; Refill: 0  4. Family history of aneurysm  - [place]A MEDICARE SCREENING; Future  5. History of tobacco use  - [place] MEDICARE SCREENING; Future   I discussed the assessment and treatment plan with the patient. The patient was provided an opportunity to ask questions and all were answered. The patient agreed with the plan and demonstrated an understanding of the instructions.  The patient was advised to call back or seek an in-person evaluation if the symptoms worsen or if the condition fails to improve as anticipated.  I provided 25  minutes of non-face-to-face time during this encounter.

## 2018-12-06 NOTE — Telephone Encounter (Signed)
Copied from [place] [id number]. Topic: Conservator, museum/gallery Patient (Clinic Use ONLY) >> Dec 05, 2018  3:03 PM Samson Frederic wrote: Reason for CRM: lvm for pt to schedule appt with Dr Ancil Boozer: Return in about 6 weeks (around 01/16/2019) for Follow up, but in October for flu vaccine . >> Dec 06, 2018  7:56 AM Karene Fry P wrote: Is Dr Ancil Boozer willing to work this pt in on Nov 13 at 4? I know that we can probably do her flu shot no later than 430. >> Dec 05, 2018  4:43 PM Sheran Luz wrote: Patient would like to know if she can be worked in after 4:00 on a Friday in October for a flu shot- nothing available for me to schedule. Patient would also like to know if she can be worked in for follow up on Nov 13th- no availability. She has a very strenuous work schedule.

## 2018-12-07 NOTE — Telephone Encounter (Signed)
lvm for pt: we are able to do her flu shot around 430 or so, however we are able to do a 4pm any other day but Friday.

## 2018-12-14 ENCOUNTER — Ambulatory Visit: Payer: Medicare Other

## 2018-12-15 ENCOUNTER — Other Ambulatory Visit: Payer: Self-pay | Admitting: Family Medicine

## 2018-12-15 DIAGNOSIS — E559 Vitamin D deficiency, unspecified: Secondary | ICD-10-CM

## 2018-12-15 DIAGNOSIS — F3342 Major depressive disorder, recurrent, in full remission: Secondary | ICD-10-CM

## 2018-12-15 MED ORDER — ESCITALOPRAM OXALATE 10 MG PO TABS: 5.0000 mg | ORAL_TABLET | Freq: Every day | ORAL | 0 refills | Status: DC

## 2018-12-15 NOTE — Telephone Encounter (Signed)
Requested medication (s) are due for refill today: yes  Requested medication (s) are on the active medication list: yes  Last refill:  09/25/2018  Future visit scheduled: no  Notes to clinic: refill cannot be delegated    Requested Prescriptions  Pending Prescriptions Disp Refills   Vitamin D, Ergocalciferol, (DRISDOL) 1.25 MG (50000 UT) CAPS capsule [Pharmacy Med Name: VITAMIN D2 1.25MG (50,000 UNIT)] 12 capsule 0    Sig: TAKE 1 CAPSULE BY MOUTH ONE TIME PER WEEK     Endocrinology:  Vitamins - Vitamin D Supplementation Failed - 12/15/2018  1:30 AM      Failed - 50,000 IU strengths are not delegated      Failed - Phosphate in normal range and within 360 days    No results found for: PHOS       Failed - Vitamin D in normal range and within 360 days    Vit D, 25-Hydroxy  Date Value Ref Range Status  09/13/2018 14 (L) 30 - 100 ng/mL Final    Comment:    Vitamin D Status         25-OH Vitamin D: . Deficiency:                    <20 ng/mL Insufficiency:             20 - 29 ng/mL Optimal:                 > or = 30 ng/mL . For 25-OH Vitamin D testing on patients on  D2-supplementation and patients for whom quantitation  of D2 and D3 fractions is required, the Quest[employer]ureD(TM) 25-OH VIT D, (D2,D3), LC/MS/MS is recommended: order  code [id number] (patients >30yrs). See Note 1 . Note 1 . For additional information, please refer to  http://education.Quest[facility].com/faq/FAQ199  (This link is being provided for informational/ educational purposes only.)          Passed - Ca in normal range and within 360 days    Calcium  Date Value Ref Range Status  09/13/2018 9.1 8.6 - 10.4 mg/dL Final   Calcium, Total  Date Value Ref Range Status  02/01/2014 8.5 8.5 - 10.1 mg/dL Final         Passed - Valid encounter within last 12 months    Recent Outpatient Visits          1 week ago Dyslipidemia associated with type 2 diabetes mellitus [facility])   [facility]  Steele Sizer, MD   3 months ago OSA (obstructive sleep apnea)   [facility], Drue Stager, MD   4 months ago Acute non-recurrent frontal sinusitis   [facility] [place], Drue Stager, MD   5 months ago Controlled type 2 diabetes with neuropathy [facility])   [facility] Steele Sizer, MD   1 year ago Dyslipidemia associated with type 2 diabetes mellitus [facility])   [facility] Steele Sizer, MD

## 2018-12-15 NOTE — Telephone Encounter (Signed)
Medication: escitalopram (LEXAPRO) 10 MG tablet [id number] - Patient is taking 10mg   1 per day  Has the patient contacted their pharmacy? Yes  (Agent: If no, request that the patient contact the pharmacy for the refill.) (Agent: If yes, when and what did the pharmacy advise?)  Preferred Pharmacy (with phone number or street name): [id number] - [place], [place] [address] [phone] (Phone) [phone] (Fax)    Agent: Please be advised that RX refills may take up to 3 business days. We ask that you follow-up with your pharmacy.

## 2018-12-15 NOTE — Telephone Encounter (Signed)
LVM t sch appt. Meds called in

## 2018-12-21 ENCOUNTER — Other Ambulatory Visit: Payer: Self-pay | Admitting: Family Medicine

## 2018-12-21 ENCOUNTER — Ambulatory Visit: Payer: Self-pay | Admitting: *Deleted

## 2018-12-21 NOTE — Telephone Encounter (Signed)
Reporting she vomited once last and moticed a small amount of bloody-mucous looking streaks. When asked how much, she could not say. She then blew her nose and twice noticed blood with the mucous. No vomiting no bloody nose since. Always has congestion and feels constant throat drainage. Denies sinus pressure/sore throat/cough. No CP/SOB/dizzines/nausea/abdominal pain. Uses flonase daily. No appetite lately and fatigued more than her usual. Denies body aches and fever. No diarrhea/no difficulty voiding. Has a hx of stomach problems with her gastric bypass 20 years ago. Diagnosed with reflux but does not take prescribed medication she was given. She will call Dr. Verl Blalock who she has seen in the past. Care Advice including humidifier/saline spray for nose, increase water intake to minimum of 6 glasses a day, monitor for bleeding. Recommended [phone] testing due to some of her symptoms. Urgent signs reviewed with patient including abdominal pain, hematemesis, dizziness/bloody stool seek immediate evaluation at the ED. Routing to provider for additional advice/appointment if needed. She was against an ED visit for the one-time blood in her emesis. No fever/no covid19 contacts/no travels.  Reason for Disposition . [1] Few streaks of blood in vomit AND [2] occurred one time  Answer Assessment - Initial Assessment Questions 1. APPEARANCE of BLOOD: "What does the blood look like?" (e.g., color, coffee-grounds)     For 2 weeks, getting sick with eating. Blood with blowing nose twice 2. AMOUNT: "How much blood was lost?"     Unsure, mixed with vomit with some clots. 3. VOMITING BLOOD: "How many times did it happen?" or "How many times in the past 24 hours?"    once 4. VOMITING WITHOUT BLOOD: "How many times in the past 24 hours?"     once 5. ONSET: "When did vomiting of blood begin?"     yesterday 6. CAUSE: "What do you think is causing the vomiting of blood?"     unsure 7. BLOOD THINNERS: "Do you take any blood  thinners?" (e.g., Coumadin/warfarin, Pradaxa/dabigatran, aspirin)     no 8. DEHYDRATION: "Are there any signs of dehydration?" "When was the last time you urinated?" "Do you feel dizzy?"     Dry mouth  9. ABDOMINAL PAIN: "Are you having any abdominal pain?" If yes: "What does it feel like? " (e.g., crampy, dull, intermittent, constant)      Has had diarrhea but that is normal. 10. DIARRHEA: "Is there any diarrhea?" If so, ask: "How many times today?"        None in the last 24 hours. 11. OTHER SYMPTOMS: "Do you have any other symptoms?" (e.g., fever, blood in stool)       No. Stomach and esophagus feels like it is burning.  12. PREGNANCY: "Is there any chance you are pregnant?" "When was your last menstrual period?"       na  Protocols used: VOMITING BLOOD-A-AH

## 2018-12-22 ENCOUNTER — Ambulatory Visit: Payer: Self-pay | Admitting: *Deleted

## 2018-12-22 NOTE — Telephone Encounter (Signed)
She had a covid test done in [place]. She doesn't think she has Covid, but a sinus infection. She would like a virtual visit. Are you able to speak with her today.

## 2018-12-22 NOTE — Telephone Encounter (Signed)
Pt disconnected the call before speaking with her.

## 2018-12-25 NOTE — Telephone Encounter (Signed)
lvm for pt the schedule appt possibly tomorrow unless we get a cancellation for today . We are completely booked today

## 2018-12-26 ENCOUNTER — Telehealth: Payer: Self-pay

## 2018-12-26 NOTE — Telephone Encounter (Signed)
Copied from [place] [id number]. Topic: General - Inquiry >> Dec 22, 2018 11:28 AM Mathis Bud wrote: Reason for CRM: Patient is requesting a call back from pcp or nurse regarding patient was triaged yesterday 10/16 Call back [phone]

## 2019-01-15 ENCOUNTER — Ambulatory Visit: Payer: Medicare Other | Admitting: Family Medicine

## 2019-01-30 ENCOUNTER — Other Ambulatory Visit: Payer: Self-pay | Admitting: Family Medicine

## 2019-01-30 DIAGNOSIS — Z87891 Personal history of nicotine dependence: Secondary | ICD-10-CM

## 2019-01-30 DIAGNOSIS — Z8249 Family history of ischemic heart disease and other diseases of the circulatory system: Secondary | ICD-10-CM

## 2019-02-16 ENCOUNTER — Ambulatory Visit (INDEPENDENT_AMBULATORY_CARE_PROVIDER_SITE_OTHER): Payer: Medicare Other | Admitting: Family Medicine

## 2019-02-16 ENCOUNTER — Encounter: Payer: Self-pay | Admitting: Family Medicine

## 2019-02-16 ENCOUNTER — Telehealth: Payer: Medicare Other | Admitting: Family Medicine

## 2019-02-16 ENCOUNTER — Other Ambulatory Visit: Payer: Self-pay

## 2019-02-16 VITALS — Wt 185.0 lb

## 2019-02-16 DIAGNOSIS — R5383 Other fatigue: Secondary | ICD-10-CM | POA: Diagnosis not present

## 2019-02-16 DIAGNOSIS — J209 Acute bronchitis, unspecified: Secondary | ICD-10-CM | POA: Diagnosis not present

## 2019-02-16 DIAGNOSIS — J069 Acute upper respiratory infection, unspecified: Secondary | ICD-10-CM | POA: Diagnosis not present

## 2019-02-16 MED ORDER — AZITHROMYCIN 250 MG PO TABS: 250.0000 mg | ORAL_TABLET | Freq: Every day | ORAL | 0 refills | Status: DC

## 2019-02-16 MED ORDER — ALBUTEROL SULFATE 108 (90 BASE) MCG/ACT IN AEPB: 2.0000 | INHALATION_SPRAY | RESPIRATORY_TRACT | 1 refills | Status: AC | PRN

## 2019-02-16 MED ORDER — PREDNISONE 20 MG PO TABS: ORAL_TABLET | ORAL | 0 refills | Status: DC

## 2019-02-16 NOTE — Progress Notes (Signed)
Name: Brooke Liu   MRN: [id number]    DOB: 1950-04-01   Date:02/16/2019       Progress Note  Subjective:    Chief Complaint  Chief Complaint  Patient presents with  . URI    onset 1 week, symptoms include:vomiting, diarrhea, chills, headache, fatigue.  Pt states she already had covid test and was negative.  Pt is having sinus problems now    I connected with  Arrie Senate Nicely  on 02/16/19 at  3:20 PM EST by a video enabled telemedicine application and verified that I am speaking with the correct person using two identifiers.  I discussed the limitations of evaluation and management by telemedicine and the availability of in person appointments. The patient expressed understanding and agreed to proceed. Staff also discussed with the patient that there may be a patient responsible charge related to this service. Patient Location: in pt's car Provider Location: [facility] clinic Additional Individuals present: none  Pt had onset of sx last week with some  and GI viral sx. And this week she had more URI sx URI  This is a new problem. The current episode started in the past 7 days. The problem has been unchanged. There has been no fever. Associated symptoms include congestion, coughing, diarrhea, nausea, rhinorrhea, sneezing, a sore throat, vomiting and wheezing. Pertinent negatives include no abdominal pain, chest pain, dysuria, ear pain, headaches, joint pain, joint swelling, neck pain, plugged ear sensation, rash, sinus pain or swollen glands. Associated symptoms comments: Chills, sweats, sneezing, coughing - non-productive but congested and wet sounding, wheezing, diarrhea, . She has tried antihistamine for the symptoms. The treatment provided no relief.   Patient states she is feeling very fatigued and overall just does not feel well.  She has expected that her symptoms would gradually improve started last week with GI symptoms, did have a Covid test which was negative, her  daughter was sick with similar symptoms she also was Covid tested and was negative, the nasal drainage is clear and profuse has not begin to decrease at all over the past week and she assumed that she would be gradually getting better.  She has continued to work this whole time despite multiple viral symptoms and constitutional symptoms including the sweats and chills she was just taking a nap in her car she is tired more than usual, and she states that she has to be nearly dying before she can stop working I did offer a work note this was sent to her through my chart. She states she is having some wheeze but denies any chest pain shortness of breath, near syncope, confusion. She has a taking her Claritin only and has not tried any other medications over-the-counter for her symptoms.   Patient Active Problem List   Diagnosis Date Noted  . OSA (obstructive sleep apnea) 12/04/2018  . Sensorineural hearing loss, unilateral, unrestrictd contralateral side 11/27/2018  . Cerebral atherosclerosis 11/27/2018  . Controlled type 2 diabetes with neuropathy ([place]) 07/07/2018  . Fecal impaction in rectum ([place]) 09/04/2017  . Obesity (BMI 30-39.9) 09/04/2017  . Senile purpura ([place]) 07/25/2017  . Recurrent major depressive disorder, in partial remission ([place]) 07/25/2017  . 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 Head injury 06/30/2016  . White matter abnormality on MRI of brain 05/04/2016  . Iron deficiency anemia   . Fatigue 10/03/2015  . History of gastric bypass 10/03/2015  . Dyslipidemia 10/02/2014  . Carotid artery narrowing 09/25/2014  . Eczema of both hands 09/25/2014  .  Benign hypertension 09/25/2014  . Allergic rhinitis, seasonal 09/25/2014  . Diabetes mellitus, type 2 ([place]) 09/05/2014  . Multinodular goiter 01/14/2014  . Dysthymia 10/15/2013  . 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 Difficulty in walking 09/25/2013  . Loss of feeling or sensation 09/25/2013  . Cephalalgia 09/25/2013  . Cervical pain  09/25/2013    Social History   Tobacco Use  . Smoking status: Former Smoker    Packs/day: 1.00    Years: 5.00    Pack years: 5.00    Types: Cigarettes    Start date: 03/08/1962    Quit date: 1979    Years since quitting: 41.9  . Smokeless tobacco: Never Used  Substance Use Topics  . Alcohol use: Not Currently    Alcohol/week: 0.0 standard drinks     Current Outpatient Medications:  .  acetaminophen (TYLENOL) 500 MG tablet, Take 500 mg by mouth every 6 (six) hours as needed for mild pain, moderate pain or headache., Disp: , Rfl:  .  Cyanocobalamin (B-12) 2500 MCG SUBL, TAKE 1 TABLET UNDER THE TONGUE EVERY DAY, Disp: , Rfl:  .  escitalopram (LEXAPRO) 10 MG tablet, Take 0.5 tablets (5 mg total) by mouth daily., Disp: 90 tablet, Rfl: 0 .  ferrous sulfate 325 (65 FE) MG EC tablet, TAKE 1 TABLET BY MOUTH EVERY DAY WITH BREAKFAST, Disp: 90 tablet, Rfl: 0 .  fluticasone (FLONASE) 50 MCG/ACT nasal spray, Place 2 sprays into both nostrils daily. (Patient taking differently: Place 2 sprays into both nostrils daily as needed for allergies or rhinitis. ), Disp: 16 g, Rfl: 2 .  JARDIANCE 10 MG TABS tablet, TAKE 10 MG BY MOUTH DAILY., Disp: 30 tablet, Rfl: 0 .  metFORMIN (GLUCOPHAGE) 1000 MG tablet, Take 1 tablet (1,000 mg total) by mouth 2 (two) times daily with a meal., Disp: 180 tablet, Rfl: 0 .  mometasone (ELOCON) 0.1 % ointment, APPLY TO AFFECTED AREA(S) OF HANDS DAILY. ALTERNATE W/ TAC CREAM.DONT USE SAME TIME/SAME DAY, Disp: , Rfl:  .  Multiple Vitamins-Minerals (BARIATRIC FUSION) CHEW, Chew 1 tablet by mouth 4 (four) times daily., Disp: , Rfl:  .  triamcinolone ointment (KENALOG) 0.5 %, APPLY TO AFFECTED AREA TWICE A DAY, Disp: 30 g, Rfl: 0 .  Vitamin D, Ergocalciferol, (DRISDOL) 1.25 MG (50000 UT) CAPS capsule, TAKE 1 CAPSULE BY MOUTH ONE TIME PER WEEK, Disp: 12 capsule, Rfl: 0 .  ezetimibe (ZETIA) 10 MG tablet, Take 1 tablet (10 mg total) by mouth daily. (Patient not taking: Reported on  02/16/2019), Disp: 90 tablet, Rfl: 0  Allergies  Allergen Reactions  . Adhesive [Tape] Other (See Comments)    Patient's skin is VERY THIN and TEARS VERY EASILY!! PLEASE USE COBAN WRAP!!  . Oxycodone-Acetaminophen Hives and Itching    Very itchy underneath the skin  . Percocet [Oxycodone-Acetaminophen] Hives and Itching    Very itchy underneath the skin  . Iodinated Diagnostic Agents Itching and Rash    MRI Dye    I personally reviewed active problem list, medication list, allergies, family history, social history, health maintenance, notes from last encounter, lab results, imaging with the patient/caregiver today.  Review of Systems  Constitutional: Negative.   HENT: Positive for congestion, rhinorrhea, sneezing and sore throat. Negative for ear pain and sinus pain.   Eyes: Negative.   Respiratory: Positive for cough and wheezing.   Cardiovascular: Negative.  Negative for chest pain.  Gastrointestinal: Positive for diarrhea, nausea and vomiting. Negative for abdominal pain.  Endocrine: Negative.   Genitourinary: Negative.  Negative for dysuria.  Musculoskeletal: Negative.  Negative for joint pain and neck pain.  Skin: Negative.  Negative for rash.  Allergic/Immunologic: Negative.   Neurological: Negative.  Negative for headaches.  Hematological: Negative.   Psychiatric/Behavioral: Negative.   All other systems reviewed and are negative.    Objective:   Virtual encounter, vitals limited, only able to obtain the following Today's Vitals   02/16/19 1547  Weight: 185 lb (83.9 kg)   Body mass index is 33.84 kg/m. Nursing Note and Vital Signs reviewed.  Physical Exam Vitals and nursing note reviewed.  Constitutional:      General: She is not in acute distress.    Appearance: She is well-developed. She is obese. She is not ill-appearing, toxic-appearing or diaphoretic.  HENT:     Head: Normocephalic and atraumatic.     Nose: Nose normal.  Eyes:     General:         Right eye: No discharge.        Left eye: No discharge.     Conjunctiva/sclera: Conjunctivae normal.  Neck:     Trachea: No tracheal deviation.  Pulmonary:     Effort: Pulmonary effort is normal. No respiratory distress.     Breath sounds: No stridor.  Skin:    Coloration: Skin is not jaundiced or pale.     Findings: No rash.  Neurological:     Mental Status: She is alert.     Motor: No abnormal muscle tone.     Coordination: Coordination normal.  Psychiatric:        Behavior: Behavior normal.     PE limited by telephone encounter  No results found for this or any previous visit (from the past 72 hour(s)).  Assessment and Plan:     ICD-10-CM   1. Upper respiratory tract infection, unspecified type  J06.9 COVID  2. Fatigue, unspecified type  R53.83 COVID  3. Acute bronchitis, unspecified organism  J20.9 Albuterol Sulfate 108 (90 Base) MCG/ACT AEPB    azithromycin (ZITHROMAX Z-PAK) 250 MG tablet    predniSONE (DELTASONE) 20 MG tablet  Prior to visit I did put in a Covid test so patient could go ahead and get tested before the testing site was closed, upon further chart review in care everywhere she was tested 2 days ago at [facility] with SARS-CoV-2 PCR, did not have a flu test done.   We discussed how negative test does not 100% exclude Covid as a possible etiology of her viral symptoms and she should still self quarantine especially while having worsening respiratory symptoms, sweats and chills and diarrhea.  Work note was sent to her through my chart.  She endorsed a history of recurrent bronchitis so I did treat her today with steroids, Z-Pak and inhaler also advised her to increase her antihistamines get decongestants and Mucinex to help dry her up, rest, push fluids, use Tylenol and ibuprofen as able and self isolate at home as much as she can until she is improving encouraged her to follow-up early next week if not feeling better.  Encouraged ER follow-up with any chest pain, worsening  shortness of breath, near syncope, confusion etc, pt verb understanding of COVID precautions and ER indications.  -Red flags and when to present for emergency care or RTC including fever >101.33F, chest pain, shortness of breath, new/worsening/un-resolving symptoms,  reviewed with patient at time of visit. Follow up and care instructions discussed and provided in AVS. - I discussed the assessment and treatment plan with the  patient. The patient was provided an opportunity to ask questions and all were answered. The patient agreed with the plan and demonstrated an understanding of the instructions.  I provided 18 minutes of non-face-to-face time during this encounter.  Delsa Grana, PA-C 02/16/19 3:55 PM

## 2019-02-28 ENCOUNTER — Other Ambulatory Visit: Payer: Self-pay

## 2019-02-28 ENCOUNTER — Encounter: Payer: Self-pay | Admitting: Family Medicine

## 2019-02-28 ENCOUNTER — Ambulatory Visit (INDEPENDENT_AMBULATORY_CARE_PROVIDER_SITE_OTHER): Payer: Medicare Other | Admitting: Family Medicine

## 2019-02-28 ENCOUNTER — Other Ambulatory Visit: Payer: Self-pay | Admitting: Family Medicine

## 2019-02-28 DIAGNOSIS — E785 Hyperlipidemia, unspecified: Secondary | ICD-10-CM

## 2019-02-28 DIAGNOSIS — F3342 Major depressive disorder, recurrent, in full remission: Secondary | ICD-10-CM | POA: Diagnosis not present

## 2019-02-28 DIAGNOSIS — E559 Vitamin D deficiency, unspecified: Secondary | ICD-10-CM | POA: Diagnosis not present

## 2019-02-28 DIAGNOSIS — Z862 Personal history of diseases of the blood and blood-forming organs and certain disorders involving the immune mechanism: Secondary | ICD-10-CM

## 2019-02-28 DIAGNOSIS — E1140 Type 2 diabetes mellitus with diabetic neuropathy, unspecified: Secondary | ICD-10-CM

## 2019-02-28 DIAGNOSIS — E1169 Type 2 diabetes mellitus with other specified complication: Secondary | ICD-10-CM | POA: Diagnosis not present

## 2019-02-28 DIAGNOSIS — G4733 Obstructive sleep apnea (adult) (pediatric): Secondary | ICD-10-CM | POA: Diagnosis not present

## 2019-02-28 DIAGNOSIS — E7800 Pure hypercholesterolemia, unspecified: Secondary | ICD-10-CM

## 2019-02-28 DIAGNOSIS — E538 Deficiency of other specified B group vitamins: Secondary | ICD-10-CM

## 2019-02-28 DIAGNOSIS — D692 Other nonthrombocytopenic purpura: Secondary | ICD-10-CM

## 2019-02-28 MED ORDER — VITAMIN D (ERGOCALCIFEROL) 1.25 MG (50000 UNIT) PO CAPS: 50000.0000 [IU] | ORAL_CAPSULE | ORAL | 1 refills | Status: AC

## 2019-02-28 MED ORDER — ROSUVASTATIN CALCIUM 5 MG PO TABS: 5.0000 mg | ORAL_TABLET | Freq: Every day | ORAL | 0 refills | Status: AC

## 2019-02-28 MED ORDER — ESCITALOPRAM OXALATE 10 MG PO TABS: 10.0000 mg | ORAL_TABLET | Freq: Every day | ORAL | 0 refills | Status: DC

## 2019-02-28 MED ORDER — FERROUS SULFATE 325 (65 FE) MG PO TBEC: DELAYED_RELEASE_TABLET | ORAL | 0 refills | Status: DC

## 2019-02-28 MED ORDER — B-12 1000 MCG SL SUBL: 1.0000 | SUBLINGUAL_TABLET | Freq: Every day | SUBLINGUAL | 1 refills | Status: AC

## 2019-02-28 NOTE — Progress Notes (Signed)
Name: Brooke Liu   MRN: [id number]    DOB: [birth year]   Date:02/28/2019       Progress Note  Subjective  Chief Complaint  Chief Complaint  Patient presents with  . Fatigue    no sickness just tired.    I connected with  Arrie Senate Nicely  on 02/28/19 at  2:20 PM EST by telephone encounter. Discussed the limitations of evaluation and management by telemedicine and the availability of in person appointments. The patient expressed understanding and agreed to proceed. Staff also discussed with the patient that there may be a patient responsible charge related to this service. Patient Location: at home  Provider Location: [facility]   HPI  DMII: she has dyslipidemia, she is back on Metformin half of 1000 mg BID, glucose in the morning is around 130 most of the time, sometimes in the evening is around 200's, but goes down with a half dose of metformin . She denies polyuria, polyphagia or polydipsia. She tries to avoid carbohydrates   Hyperlipidemia: she states statin therapy caused diarrhea in the past, we started her on Zetia but she could not tolerate because it caused diarrhea. She is willing to try a statin  The 10-year ASCVD risk score Mikey Bussing DC Brooke Bonito., et al., 2013) is: 15.7%   Values used to calculate the score:     Age: 68 years     Sex: Female     Is Non-Hispanic African American: No     Diabetic: Yes     Tobacco smoker: No     Systolic Blood Pressure: [id number] mmHg     Is BP treated: No     HDL Cholesterol: 41 mg/dL     Total Cholesterol: 198 mg/dL   Vitamin D deficiency: she is out of supplementation, we will send rx to pharmacy   History of bariatric surgery: she is worried about her zinc and vitamins levels. She takes a multivitamin daily   Inbalance and hearing loss: seen by ENT,had MRI brain and is doing better now.   Family history of abdominal aorta aneurysm: she contacted her insurance and states it will be covered to have screen done,  she was a smoker, her sister had abdominal aorta aneurysm  Fatigue: she was tired this morning, but doing much better since she saw Delsa Grana Feb 16, 2019 was given prednisone and Zpack and symptoms of cough and fatigue has resolved  Patient Active Problem List   Diagnosis Date Noted  . OSA (obstructive sleep apnea) 12/04/2018  . Sensorineural hearing loss, unilateral, unrestrictd contralateral side 11/27/2018  . Cerebral atherosclerosis 11/27/2018  . Controlled type 2 diabetes with neuropathy ([place]) 07/07/2018  . Fecal impaction in rectum ([place]) 09/04/2017  . Obesity (BMI 30-39.9) 09/04/2017  . Senile purpura ([place]) 07/25/2017  . Recurrent major depressive disorder, in partial remission ([place]) 07/25/2017  . 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 Head injury 06/30/2016  . White matter abnormality on MRI of brain 05/04/2016  . Iron deficiency anemia   . Fatigue 10/03/2015  . History of gastric bypass 10/03/2015  . Dyslipidemia 10/02/2014  . Carotid artery narrowing 09/25/2014  . Eczema of both hands 09/25/2014  . Benign hypertension 09/25/2014  . Allergic rhinitis, seasonal 09/25/2014  . Diabetes mellitus, type 2 ([place]) 09/05/2014  . Multinodular goiter 01/14/2014  . Dysthymia 10/15/2013  . 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 Difficulty in walking 09/25/2013  . Loss of feeling or sensation 09/25/2013  . Cephalalgia 09/25/2013  . Cervical pain 09/25/2013  Social History   Tobacco Use  . Smoking status: Former Smoker    Packs/day: 1.00    Years: 5.00    Pack years: 5.00    Types: Cigarettes    Start date: 03/08/1962    Quit date: 1979    Years since quitting: 42.0  . Smokeless tobacco: Never Used  Substance Use Topics  . Alcohol use: Not Currently    Alcohol/week: 0.0 standard drinks     Current Outpatient Medications:  .  acetaminophen (TYLENOL) 500 MG tablet, Take 500 mg by mouth every 6 (six) hours as needed for mild pain, moderate pain or headache., Disp: , Rfl:  .   Albuterol Sulfate 108 (90 Base) MCG/ACT AEPB, Inhale 2 puffs into the lungs every 4 (four) hours as needed (wheeze, SOB, coughing fits)., Disp: 1 each, Rfl: 1 .  Cyanocobalamin (B-12) 2500 MCG SUBL, TAKE 1 TABLET UNDER THE TONGUE EVERY DAY, Disp: , Rfl:  .  escitalopram (LEXAPRO) 10 MG tablet, Take 0.5 tablets (5 mg total) by mouth daily., Disp: 90 tablet, Rfl: 0 .  ferrous sulfate 325 (65 FE) MG EC tablet, TAKE 1 TABLET BY MOUTH EVERY DAY WITH BREAKFAST, Disp: 90 tablet, Rfl: 0 .  fluticasone (FLONASE) 50 MCG/ACT nasal spray, Place 2 sprays into both nostrils daily. (Patient taking differently: Place 2 sprays into both nostrils daily as needed for allergies or rhinitis. ), Disp: 16 g, Rfl: 2 .  metFORMIN (GLUCOPHAGE) 1000 MG tablet, TAKE 1 TABLET (1,000 MG TOTAL) BY MOUTH 2 (TWO) TIMES DAILY WITH A MEAL., Disp: 180 tablet, Rfl: 0 .  mometasone (ELOCON) 0.1 % ointment, APPLY TO AFFECTED AREA(S) OF HANDS DAILY. ALTERNATE W/ TAC CREAM.DONT USE SAME TIME/SAME DAY, Disp: , Rfl:  .  Multiple Vitamins-Minerals (BARIATRIC FUSION) CHEW, Chew 1 tablet by mouth 4 (four) times daily., Disp: , Rfl:  .  triamcinolone cream (KENALOG) 0.5 %, APPLY TO AFFECTED AREA OF HANDS TWICE A DAY, Disp: , Rfl:  .  triamcinolone ointment (KENALOG) 0.5 %, APPLY TO AFFECTED AREA TWICE A DAY, Disp: 30 g, Rfl: 0 .  Vitamin D, Ergocalciferol, (DRISDOL) 1.25 MG (50000 UT) CAPS capsule, TAKE 1 CAPSULE BY MOUTH ONE TIME PER WEEK, Disp: 12 capsule, Rfl: 0  Allergies  Allergen Reactions  . Adhesive [Tape] Other (See Comments)    Patient's skin is VERY THIN and TEARS VERY EASILY!! PLEASE USE COBAN WRAP!!  . Oxycodone-Acetaminophen Hives and Itching    Very itchy underneath the skin  . Percocet [Oxycodone-Acetaminophen] Hives and Itching    Very itchy underneath the skin  . Iodinated Diagnostic Agents Itching and Rash    MRI Dye    I personally reviewed active problem list, medication list, allergies, family history, social  history, health maintenance with the patient/caregiver today.  ROS  Ten systems reviewed and is negative except as mentioned in HPI   Objective  Virtual encounter, vitals not obtained.  There is no height or weight on file to calculate BMI.  Nursing Note and Vital Signs reviewed.  Physical Exam  Awake, alert and oriented  Assessment & Plan  1. Dyslipidemia associated with type 2 diabetes mellitus (HCC)  - Lipid panel - Hemoglobin A1c - Microalbumin / creatinine urine ratio - rosuvastatin (CRESTOR) 5 MG tablet; Take 1 tablet (5 mg total) by mouth daily.  Dispense: 90 tablet; Refill: 0  2. Vitamin D deficiency  - Vitamin D, Ergocalciferol, (DRISDOL) 1.25 MG (50000 UT) CAPS capsule; Take 1 capsule (50,000 Units total) by mouth every 7 (  seven) days.  Dispense: 12 capsule; Refill: 1 - VITAMIN D 25 Hydroxy (Vit-D Deficiency, Fractures)  3. Recurrent major depressive disorder, in full remission ([place])  - escitalopram (LEXAPRO) 10 MG tablet; Take 1 tablet (10 mg total) by mouth daily.  Dispense: 90 tablet; Refill: 0  4. OSA (obstructive sleep apnea)   5. Pure hypercholesterolemia   6. Vitamin B12 deficiency  - Vitamin B12 - Cyanocobalamin (B-12) 1000 MCG SUBL; Place 1 tablet under the tongue daily.  Dispense: 90 tablet; Refill: 1  7. Senile purpura ([place])  8. History of iron deficiency anemia  - CBC with Differential/Platelet - Iron, TIBC and Ferritin Panel - ferrous sulfate 325 (65 FE) MG EC tablet; TAKE 1 TABLET BY MOUTH EVERY DAY WITH BREAKFAST  Dispense: 90 tablet; Refill: 0   -Red flags and when to present for emergency care or RTC including fever >101.1F, chest pain, shortness of breath, new/worsening/un-resolving symptoms,  reviewed with patient at time of visit. Follow up and care instructions discussed and provided in AVS. - I discussed the assessment and treatment plan with the patient. The patient was provided an opportunity to ask questions and all were  answered. The patient agreed with the plan and demonstrated an understanding of the instructions.  I provided 25  minutes of non-face-to-face time during this encounter.  Loistine Chance, MD

## 2019-03-05 ENCOUNTER — Ambulatory Visit: Payer: Medicare Other

## 2019-03-21 ENCOUNTER — Telehealth: Payer: Self-pay

## 2019-03-21 NOTE — Telephone Encounter (Signed)
Patient states she has a full blown yeast infection and previously Dr. Ancil Boozer called her in [place] and she wanted to see if she would call her in another pill.

## 2019-03-21 NOTE — Telephone Encounter (Signed)
Copied from [place] [id number]. Topic: General - Inquiry >> Mar 21, 2019 11:20 AM Alease Frame wrote: Reason for CRM: patient is wanting a call back from Dr Ancil Boozer nurse. Please advise

## 2019-03-21 NOTE — Telephone Encounter (Signed)
Onset-3 days with irritation, sore, itching on the outside with white creamy discharge. She has tried all the yeast medications OTC which has not helped.

## 2019-03-22 ENCOUNTER — Other Ambulatory Visit: Payer: Self-pay | Admitting: Family Medicine

## 2019-03-22 MED ORDER — FLUCONAZOLE 150 MG PO TABS: 150.0000 mg | ORAL_TABLET | ORAL | 0 refills | Status: AC

## 2019-03-22 NOTE — Telephone Encounter (Signed)
Patient notified by phone diflucan was sent in to her pharmacy.

## 2019-04-26 ENCOUNTER — Telehealth: Payer: Self-pay | Admitting: Family Medicine

## 2019-04-26 DIAGNOSIS — F3342 Major depressive disorder, recurrent, in full remission: Secondary | ICD-10-CM

## 2019-04-26 NOTE — Telephone Encounter (Signed)
Requested medication (s) are due for refill today:  Yes  Requested medication (s) are on the active medication list:   Yes  Future visit scheduled:   No   Last ordered: 02/28/2019  #90  0 refills  Returned because the Kenalog cream is from a historical provider.   Requested Prescriptions  Pending Prescriptions Disp Refills   escitalopram (LEXAPRO) 10 MG tablet [Pharmacy Med Name: ESCITALOPRAM 10 MG TABLET] 90 tablet 0    Sig: TAKE 1 TABLET BY MOUTH EVERY DAY      Psychiatry:  Antidepressants - SSRI Passed - 04/26/2019  9:49 AM      Passed - Completed PHQ-2 or PHQ-9 in the last 360 days.      Passed - Valid encounter within last 6 months    Recent Outpatient Visits           1 month ago Dyslipidemia associated with type 2 diabetes mellitus [facility])   [facility] Steele Sizer, MD   2 months ago Upper respiratory tract infection, unspecified type   [facility] Delsa Grana, PA-C   4 months ago Dyslipidemia associated with type 2 diabetes mellitus [facility])   [facility] Steele Sizer, MD   7 months ago OSA (obstructive sleep apnea)   [facility] [place], Drue Stager, MD   8 months ago Acute non-recurrent frontal sinusitis   [facility] [place], Drue Stager, MD                triamcinolone cream (KENALOG) 0.5 % [Pharmacy Med Name: TRIAMCINOLONE 0.5% CREAM] 15 g 0    Sig: APPLY TO AFFECTED AREA OF HANDS TWICE A DAY      Dermatology:  Corticosteroids Passed - 04/26/2019  9:49 AM      Passed - Valid encounter within last 12 months    Recent Outpatient Visits           1 month ago Dyslipidemia associated with type 2 diabetes mellitus [facility])   [facility] Steele Sizer, MD   2 months ago Upper respiratory tract infection, unspecified type   [facility] Delsa Grana, PA-C   4 months ago Dyslipidemia associated with type 2 diabetes mellitus [facility])   [facility] Steele Sizer, MD   7 months ago OSA (obstructive sleep apnea)   [facility] Steele Sizer, MD   8 months ago Acute non-recurrent frontal sinusitis   [facility] Steele Sizer, MD

## 2019-04-30 NOTE — Telephone Encounter (Signed)
lvm for scheduling °

## 2019-06-19 DIAGNOSIS — Z9884 Bariatric surgery status: Secondary | ICD-10-CM | POA: Diagnosis not present

## 2019-06-19 DIAGNOSIS — E042 Nontoxic multinodular goiter: Secondary | ICD-10-CM | POA: Diagnosis not present

## 2019-06-19 DIAGNOSIS — Z1231 Encounter for screening mammogram for malignant neoplasm of breast: Secondary | ICD-10-CM | POA: Diagnosis not present

## 2019-06-19 DIAGNOSIS — E785 Hyperlipidemia, unspecified: Secondary | ICD-10-CM | POA: Diagnosis not present

## 2019-06-19 DIAGNOSIS — E118 Type 2 diabetes mellitus with unspecified complications: Secondary | ICD-10-CM | POA: Diagnosis not present

## 2019-06-19 DIAGNOSIS — M810 Age-related osteoporosis without current pathological fracture: Secondary | ICD-10-CM | POA: Diagnosis not present

## 2019-06-19 DIAGNOSIS — D508 Other iron deficiency anemias: Secondary | ICD-10-CM | POA: Diagnosis not present

## 2019-06-19 DIAGNOSIS — D511 Vitamin B12 deficiency anemia due to selective vitamin B12 malabsorption with proteinuria: Secondary | ICD-10-CM | POA: Diagnosis not present

## 2019-06-19 DIAGNOSIS — E559 Vitamin D deficiency, unspecified: Secondary | ICD-10-CM | POA: Diagnosis not present

## 2019-07-03 DIAGNOSIS — E119 Type 2 diabetes mellitus without complications: Secondary | ICD-10-CM | POA: Diagnosis not present

## 2019-07-23 DIAGNOSIS — Z780 Asymptomatic menopausal state: Secondary | ICD-10-CM | POA: Diagnosis not present

## 2019-07-23 DIAGNOSIS — Z1231 Encounter for screening mammogram for malignant neoplasm of breast: Secondary | ICD-10-CM | POA: Diagnosis not present

## 2019-07-23 DIAGNOSIS — M8588 Other specified disorders of bone density and structure, other site: Secondary | ICD-10-CM | POA: Diagnosis not present

## 2019-07-23 DIAGNOSIS — M810 Age-related osteoporosis without current pathological fracture: Secondary | ICD-10-CM | POA: Diagnosis not present

## 2019-09-17 DIAGNOSIS — S40012A Contusion of left shoulder, initial encounter: Secondary | ICD-10-CM | POA: Diagnosis not present

## 2019-09-17 DIAGNOSIS — M810 Age-related osteoporosis without current pathological fracture: Secondary | ICD-10-CM | POA: Diagnosis not present

## 2019-09-17 DIAGNOSIS — M19012 Primary osteoarthritis, left shoulder: Secondary | ICD-10-CM | POA: Diagnosis not present

## 2019-09-17 DIAGNOSIS — R2689 Other abnormalities of gait and mobility: Secondary | ICD-10-CM | POA: Diagnosis not present

## 2019-09-17 DIAGNOSIS — Z9181 History of falling: Secondary | ICD-10-CM | POA: Diagnosis not present

## 2019-10-15 DIAGNOSIS — E119 Type 2 diabetes mellitus without complications: Secondary | ICD-10-CM | POA: Diagnosis not present

## 2019-10-15 DIAGNOSIS — E0590 Thyrotoxicosis, unspecified without thyrotoxic crisis or storm: Secondary | ICD-10-CM | POA: Diagnosis not present

## 2019-10-15 DIAGNOSIS — M810 Age-related osteoporosis without current pathological fracture: Secondary | ICD-10-CM | POA: Diagnosis not present

## 2019-10-15 DIAGNOSIS — E042 Nontoxic multinodular goiter: Secondary | ICD-10-CM | POA: Diagnosis not present

## 2019-10-21 ENCOUNTER — Encounter: Payer: Self-pay | Admitting: Emergency Medicine

## 2019-10-21 ENCOUNTER — Ambulatory Visit
Admission: EM | Admit: 2019-10-21 | Discharge: 2019-10-21 | Disposition: A | Discharge status: Home / Self Care | Payer: Medicare Other | Attending: Internal Medicine | Admitting: Internal Medicine

## 2019-10-21 ENCOUNTER — Other Ambulatory Visit: Payer: Self-pay

## 2019-10-21 DIAGNOSIS — E7800 Pure hypercholesterolemia, unspecified: Secondary | ICD-10-CM | POA: Diagnosis not present

## 2019-10-21 DIAGNOSIS — R0981 Nasal congestion: Secondary | ICD-10-CM | POA: Insufficient documentation

## 2019-10-21 DIAGNOSIS — Z7984 Long term (current) use of oral hypoglycemic drugs: Secondary | ICD-10-CM | POA: Insufficient documentation

## 2019-10-21 DIAGNOSIS — E669 Obesity, unspecified: Secondary | ICD-10-CM | POA: Insufficient documentation

## 2019-10-21 DIAGNOSIS — R05 Cough: Secondary | ICD-10-CM | POA: Diagnosis not present

## 2019-10-21 DIAGNOSIS — Z9884 Bariatric surgery status: Secondary | ICD-10-CM | POA: Diagnosis not present

## 2019-10-21 DIAGNOSIS — E1140 Type 2 diabetes mellitus with diabetic neuropathy, unspecified: Secondary | ICD-10-CM | POA: Diagnosis not present

## 2019-10-21 DIAGNOSIS — E785 Hyperlipidemia, unspecified: Secondary | ICD-10-CM | POA: Insufficient documentation

## 2019-10-21 DIAGNOSIS — H905 Unspecified sensorineural hearing loss: Secondary | ICD-10-CM | POA: Insufficient documentation

## 2019-10-21 DIAGNOSIS — Z87891 Personal history of nicotine dependence: Secondary | ICD-10-CM | POA: Diagnosis not present

## 2019-10-21 DIAGNOSIS — F3341 Major depressive disorder, recurrent, in partial remission: Secondary | ICD-10-CM | POA: Diagnosis not present

## 2019-10-21 DIAGNOSIS — Z6836 Body mass index (BMI) 36.0-36.9, adult: Secondary | ICD-10-CM | POA: Diagnosis not present

## 2019-10-21 DIAGNOSIS — D509 Iron deficiency anemia, unspecified: Secondary | ICD-10-CM | POA: Diagnosis not present

## 2019-10-21 DIAGNOSIS — F419 Anxiety disorder, unspecified: Secondary | ICD-10-CM | POA: Diagnosis not present

## 2019-10-21 DIAGNOSIS — B349 Viral infection, unspecified: Secondary | ICD-10-CM | POA: Insufficient documentation

## 2019-10-21 DIAGNOSIS — Z79899 Other long term (current) drug therapy: Secondary | ICD-10-CM | POA: Diagnosis not present

## 2019-10-21 DIAGNOSIS — Z20822 Contact with and (suspected) exposure to covid-19: Secondary | ICD-10-CM | POA: Diagnosis not present

## 2019-10-21 DIAGNOSIS — I10 Essential (primary) hypertension: Secondary | ICD-10-CM | POA: Insufficient documentation

## 2019-10-21 LAB — CBC WITH DIFFERENTIAL/PLATELET
Abs Immature Granulocytes: 0.02 10*3/uL (ref 0.00–0.07)
Basophils Absolute: 0.1 10*3/uL (ref 0.0–0.1)
Basophils Relative: 1 %
Eosinophils Absolute: 0.3 10*3/uL (ref 0.0–0.5)
Eosinophils Relative: 3 %
HCT: 44.9 % (ref 36.0–46.0)
Hemoglobin: 14.5 g/dL (ref 12.0–15.0)
Immature Granulocytes: 0 %
Lymphocytes Relative: 26 %
Lymphs Abs: 2.3 10*3/uL (ref 0.7–4.0)
MCH: 27.2 pg (ref 26.0–34.0)
MCHC: 32.3 g/dL (ref 30.0–36.0)
MCV: 84.2 fL (ref 80.0–100.0)
Monocytes Absolute: 0.6 10*3/uL (ref 0.1–1.0)
Monocytes Relative: 7 %
Neutro Abs: 5.5 10*3/uL (ref 1.7–7.7)
Neutrophils Relative %: 63 %
Platelets: 272 10*3/uL (ref 150–400)
RBC: 5.33 MIL/uL — ABNORMAL HIGH (ref 3.87–5.11)
RDW: 14.7 % (ref 11.5–15.5)
WBC: 8.8 10*3/uL (ref 4.0–10.5)
nRBC: 0 % (ref 0.0–0.2)

## 2019-10-21 LAB — COMPREHENSIVE METABOLIC PANEL
ALT: 28 U/L (ref 0–44)
AST: 25 U/L (ref 15–41)
Albumin: 4.5 g/dL (ref 3.5–5.0)
Alkaline Phosphatase: 75 U/L (ref 38–126)
Anion gap: 10 (ref 5–15)
BUN: 9 mg/dL (ref 8–23)
CO2: 24 mmol/L (ref 22–32)
Calcium: 9.1 mg/dL (ref 8.9–10.3)
Chloride: 104 mmol/L (ref 98–111)
Creatinine, Ser: 0.6 mg/dL (ref 0.44–1.00)
GFR calc Af Amer: >60 mL/min (ref >60)
GFR calc non Af Amer: >60 mL/min (ref >60)
Glucose, Bld: 108 mg/dL — ABNORMAL HIGH (ref 70–99)
Potassium: 4.6 mmol/L (ref 3.5–5.1)
Sodium: 138 mmol/L (ref 135–145)
Total Bilirubin: 0.2 mg/dL — ABNORMAL LOW (ref 0.3–1.2)
Total Protein: 8.6 g/dL — ABNORMAL HIGH (ref 6.5–8.1)

## 2019-10-21 MED ORDER — ONDANSETRON 4 MG PO TBDP
4.0000 mg | ORAL_TABLET | Freq: Three times a day (TID) | ORAL | 0 refills | Status: AC | PRN
Start: 2019-10-21 — End: ?

## 2019-10-21 NOTE — Discharge Instructions (Addendum)
Increase oral fluid intake If your symptoms worsen please return to the urgent care to be reevaluated If your blood work is abnormal we will give you a call and update your plan of care.

## 2019-10-21 NOTE — ED Triage Notes (Signed)
Patient c/o cough, chest congestion, sneezing, and diarrhea for almost a week.  Patient reports chills not sure if she has had a fever. Patient has had her COVID vaccine.

## 2019-10-22 ENCOUNTER — Other Ambulatory Visit: Payer: Self-pay | Admitting: Family Medicine

## 2019-10-22 DIAGNOSIS — Z862 Personal history of diseases of the blood and blood-forming organs and certain disorders involving the immune mechanism: Secondary | ICD-10-CM

## 2019-10-22 LAB — SARS CORONAVIRUS 2 (TAT 6-24 HRS): SARS Coronavirus 2: NEGATIVE

## 2019-10-25 NOTE — ED Provider Notes (Addendum)
[facility]    CSN: [id number] Arrival date & time: 10/21/19  1402      History   Chief Complaint Chief Complaint  Patient presents with  . Nasal Congestion  . Diarrhea  . Cough    HPI [name] is a 69 y.o. female fully vaccinated against covid-19 virus comes to the urgent care with 1 week history of cough, nasal congestion, sneezing and diarrhea.  Patient symptoms started insidiously and has been persistent.  She denies any fever chills. No sick contacts. Patient endorses nausea without vomiting.No body aches.    Past Medical History:  Diagnosis Date  . Anemia   . Anxiety   . Depression   . Diabetes mellitus, type II ([place])   . GERD (gastroesophageal reflux disease)   . Headache    stress/sinus; "not frequent" (09/05/2017)  . Hemorrhoids   . High cholesterol   . History of blood transfusion    "S/P hysterectomy"  . Motion sickness   . Multiple thyroid nodules   . Sleep apnea    past hx.  resolved with wt loss (09/05/2017)  . Uterine cancer ([place])   . Vertigo    sinus related    Patient Active Problem List   Diagnosis Date Noted  . OSA (obstructive sleep apnea) 12/04/2018  . Sensorineural hearing loss, unilateral, unrestrictd contralateral side 11/27/2018  . Cerebral atherosclerosis 11/27/2018  . Controlled type 2 diabetes with neuropathy ([place]) 07/07/2018  . Fecal impaction in rectum ([place]) 09/04/2017  . Obesity (BMI 30-39.9) 09/04/2017  . Senile purpura ([place]) 07/25/2017  . Recurrent major depressive disorder, in partial remission ([place]) 07/25/2017  . 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 Head injury 06/30/2016  . White matter abnormality on MRI of brain 05/04/2016  . Iron deficiency anemia   . Fatigue 10/03/2015  . History of gastric bypass 10/03/2015  . Dyslipidemia 10/02/2014  . Carotid artery narrowing 09/25/2014  . Eczema of both hands 09/25/2014  . Benign hypertension 09/25/2014  . Allergic rhinitis, seasonal 09/25/2014  . Diabetes  mellitus, type 2 ([place]) 09/05/2014  . Multinodular goiter 01/14/2014  . Dysthymia 10/15/2013  . 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 Difficulty in walking 09/25/2013  . Loss of feeling or sensation 09/25/2013  . Cephalalgia 09/25/2013  . Cervical pain 09/25/2013    Past Surgical History:  Procedure Laterality Date  . ABDOMINAL HERNIA REPAIR  X 2  . BACK SURGERY    . CESAREAN SECTION  1979; 1991  . CHOLECYSTECTOMY OPEN  1972  . COLECTOMY    . COLONOSCOPY WITH PROPOFOL N/A 11/21/2015   Procedure: COLONOSCOPY WITH PROPOFOL;  Surgeon: Lucilla Lame, MD;  Location: [facility];  Service: Endoscopy;  Laterality: N/A;  . ESOPHAGEAL MANOMETRY N/A 12/22/2016   Procedure: ESOPHAGEAL MANOMETRY (EM);  Surgeon: Mauri Pole, MD;  Location: [facility];  Service: Endoscopy;  Laterality: N/A;  . ESOPHAGOGASTRODUODENOSCOPY (EGD) WITH PROPOFOL N/A 11/21/2015   Procedure: ESOPHAGOGASTRODUODENOSCOPY (EGD) WITH PROPOFOL;  Surgeon: Lucilla Lame, MD;  Location: [place];  Service: Endoscopy;  Laterality: N/A;  Diabetic - oral meds  . FECAL DISIMPACTION  09/05/2017  . GASTRIC BYPASS  2000  . HERNIA REPAIR    . INCISION AND DRAINAGE PERIRECTAL ABSCESS N/A 09/05/2017   Procedure: FECAL DISIMPACTION;  Surgeon: Rolm Bookbinder, MD;  Location: [place];  Service: General;  Laterality: N/A;  . LAPAROSCOPIC SMALL BOWEL RESECTION  ~ 2017   "obstruction"  . [place] SURGERY  2010   "L4"  . TONSILLECTOMY  1950s  .  VAGINAL HYSTERECTOMY  ~ 2000    OB History   No obstetric history on file.      Home Medications    Prior to Admission medications   Medication Sig Start Date End Date Taking? Authorizing Provider  Cyanocobalamin (B-12) 1000 MCG SUBL Place 1 tablet under the tongue daily. 02/28/19  Yes Sowles, Drue Stager, MD  ezetimibe (ZETIA) 10 MG tablet Take by mouth. 08/03/19 08/02/20 Yes [provider]  fluticasone (FLONASE) 50 MCG/ACT nasal spray Place 2 sprays into both  nostrils daily. Patient taking differently: Place 2 sprays into both nostrils daily as needed for allergies or rhinitis.  05/04/16  Yes Rochel Brome A, MD  metFORMIN (GLUCOPHAGE) 1000 MG tablet TAKE 1 TABLET (1,000 MG TOTAL) BY MOUTH 2 (TWO) TIMES DAILY WITH A MEAL. 02/28/19  Yes Sowles, Drue Stager, MD  Multiple Vitamins-Minerals (BARIATRIC FUSION) CHEW Chew 1 tablet by mouth 4 (four) times daily.   Yes [provider]  Vitamin D, Ergocalciferol, (DRISDOL) 1.25 MG (50000 UT) CAPS capsule Take 1 capsule (50,000 Units total) by mouth every 7 (seven) days. 02/28/19  Yes Sowles, Drue Stager, MD  acetaminophen (TYLENOL) 500 MG tablet Take 500 mg by mouth every 6 (six) hours as needed for mild pain, moderate pain or headache.    [provider]  Albuterol Sulfate 108 (90 Base) MCG/ACT AEPB Inhale 2 puffs into the lungs every 4 (four) hours as needed (wheeze, SOB, coughing fits). 02/16/19   Delsa Grana, PA-C  ferrous sulfate 325 (65 FE) MG EC tablet TAKE 1 TABLET BY MOUTH EVERY DAY WITH BREAKFAST 10/22/19   Steele Sizer, MD  fluconazole (DIFLUCAN) 150 MG tablet Take 1 tablet (150 mg total) by mouth every other day. 03/22/19   Sowles, Drue Stager, MD  mometasone (ELOCON) 0.1 % ointment APPLY TO AFFECTED AREA(S) OF HANDS DAILY. ALTERNATE W/ TAC CREAM.DONT USE SAME TIME/SAME DAY 09/13/18   [provider]  ondansetron (ZOFRAN ODT) 4 MG disintegrating tablet Take 1 tablet (4 mg total) by mouth every 8 (eight) hours as needed for nausea or vomiting. 10/21/19   Jemina Scahill, Myrene Galas, MD  rosuvastatin (CRESTOR) 5 MG tablet Take 1 tablet (5 mg total) by mouth daily. 02/28/19   Steele Sizer, MD  triamcinolone cream (KENALOG) 0.5 % APPLY TO AFFECTED AREA OF HANDS TWICE A DAY 04/26/19   Ancil Boozer, Drue Stager, MD  escitalopram (LEXAPRO) 10 MG tablet TAKE 1 TABLET BY MOUTH EVERY DAY 04/26/19 10/21/19  Steele Sizer, MD    Family History Family History  Problem Relation Age of Onset  . Depression Mother   .  Diabetes Mother   . Heart disease Mother   . Heart disease Father   . Diabetes Sister   . Diabetes Brother   . Dementia Brother   . Heart disease Brother   . Diabetes Sister   . Fibromyalgia Sister   . Diabetes Brother   . Heart disease Brother   . Diabetes Brother   . Heart disease Brother   . Leukemia Brother   . Cancer Brother   . Liver disease Brother   . Alcohol abuse Brother   . Throat cancer Brother   . Heart disease Brother   . Diabetes Brother     Social History Social History   Tobacco Use  . Smoking status: Former Smoker    Packs/day: 1.00    Years: 5.00    Pack years: 5.00    Types: Cigarettes    Start date: 03/08/1962    Quit date: 1979  Years since quitting: 42.6  . Smokeless tobacco: Never Used  Vaping Use  . Vaping Use: Never used  Substance Use Topics  . Alcohol use: Not Currently    Alcohol/week: 0.0 standard drinks  . Drug use: Not Currently     Allergies   Adhesive [tape], Oxycodone-acetaminophen, Percocet [oxycodone-acetaminophen], Zetia [ezetimibe], and Iodinated diagnostic agents   Review of Systems As above   Physical Exam Triage Vital Signs ED Triage Vitals  Enc Vitals Group     BP 10/21/19 1527 (!) 143/85     Pulse Rate 10/21/19 1527 70     Resp 10/21/19 1527 16     Temp 10/21/19 1527 98 F (36.7 C)     Temp Source 10/21/19 1527 Oral     SpO2 10/21/19 1527 97 %     Weight 10/21/19 1523 190 lb (86.2 kg)     Height 10/21/19 1523 5\' 2"  (1.575 m)     Head Circumference --      Peak Flow --      Pain Score 10/21/19 1523 0     Pain Loc --      Pain Edu? --      Excl. in [place]? --    No data found.  Updated Vital Signs BP (!) 143/85 (BP Location: Left Arm)   Pulse 70   Temp 98 F (36.7 C) (Oral)   Resp 16   Ht 5\' 2"  (1.575 m)   Wt 86.2 kg   SpO2 97%   BMI 34.75 kg/m   Visual Acuity Right Eye Distance:   Left Eye Distance:   Bilateral Distance:    Right Eye Near:   Left Eye Near:    Bilateral Near:      Physical Exam Vitals and nursing note reviewed.  Constitutional:      General: She is not in acute distress.    Appearance: She is not ill-appearing.  Cardiovascular:     Rate and Rhythm: Normal rate and regular rhythm.     Pulses: Normal pulses.     Heart sounds: Normal heart sounds.  Pulmonary:     Effort: Pulmonary effort is normal.     Breath sounds: Normal breath sounds.  Abdominal:     General: Bowel sounds are normal.  Musculoskeletal:        General: Normal range of motion.  Skin:    General: Skin is warm.     Coloration: Skin is not jaundiced.     Findings: No erythema or lesion.  Neurological:     Mental Status: She is alert.      UC Treatments / Results  Labs (all labs ordered are listed, but only abnormal results are displayed) Labs Reviewed  COMPREHENSIVE METABOLIC PANEL - Abnormal; Notable for the following components:      Result Value   Glucose, Bld 108 (*)    Total Protein 8.6 (*)    Total Bilirubin 0.2 (*)    All other components within normal limits  CBC WITH DIFFERENTIAL/PLATELET - Abnormal; Notable for the following components:   RBC 5.33 (*)    All other components within normal limits  SARS CORONAVIRUS 2 (TAT 6-24 HRS)    EKG   Radiology No results found.  Procedures Procedures (including critical care time)  Medications Ordered in UC Medications - No data to display  Initial Impression / Assessment and Plan / UC Course  I have reviewed the triage vital signs and the nursing notes.  Pertinent labs & imaging results that  were available during my care of the patient were reviewed by me and considered in my medical decision making (see chart for details).    1.  Acute viral syndrome: Covid-19 PCR test sent CBC, BMP. Patient is advised to quarantine until COVID-19 test results available Push oral fluids Zofran as needed for nausea Return precautions given. Final Clinical Impressions(s) / UC Diagnoses   Final diagnoses:   Acute viral syndrome     Discharge Instructions     Increase oral fluid intake If your symptoms worsen please return to the urgent care to be reevaluated If your blood work is abnormal we will give you a call and update your plan of care.   ED Prescriptions    Medication Sig Dispense Auth. Provider   ondansetron (ZOFRAN ODT) 4 MG disintegrating tablet Take 1 tablet (4 mg total) by mouth every 8 (eight) hours as needed for nausea or vomiting. 20 tablet Travus Oren, Myrene Galas, MD     PDMP not reviewed this encounter.   Chase Picket, MD 10/25/19 1202    Chase Picket, MD 10/25/19 [phone]

## 2019-11-07 DIAGNOSIS — E119 Type 2 diabetes mellitus without complications: Secondary | ICD-10-CM | POA: Diagnosis not present

## 2019-11-07 DIAGNOSIS — M810 Age-related osteoporosis without current pathological fracture: Secondary | ICD-10-CM | POA: Diagnosis not present

## 2019-11-07 DIAGNOSIS — E042 Nontoxic multinodular goiter: Secondary | ICD-10-CM | POA: Diagnosis not present

## 2019-11-09 ENCOUNTER — Other Ambulatory Visit: Payer: Self-pay | Admitting: Family Medicine

## 2019-11-09 DIAGNOSIS — Z1231 Encounter for screening mammogram for malignant neoplasm of breast: Secondary | ICD-10-CM

## 2019-11-09 DIAGNOSIS — M8580 Other specified disorders of bone density and structure, unspecified site: Secondary | ICD-10-CM

## 2019-12-07 DIAGNOSIS — R059 Cough, unspecified: Secondary | ICD-10-CM | POA: Diagnosis not present

## 2019-12-07 DIAGNOSIS — J0111 Acute recurrent frontal sinusitis: Secondary | ICD-10-CM | POA: Diagnosis not present

## 2019-12-11 ENCOUNTER — Telehealth: Payer: Medicare Other | Admitting: Internal Medicine

## 2020-01-08 ENCOUNTER — Ambulatory Visit: Payer: Medicare Other | Admitting: Family Medicine

## 2020-02-08 DIAGNOSIS — E538 Deficiency of other specified B group vitamins: Secondary | ICD-10-CM | POA: Diagnosis not present

## 2020-02-08 DIAGNOSIS — E119 Type 2 diabetes mellitus without complications: Secondary | ICD-10-CM | POA: Diagnosis not present

## 2020-02-08 DIAGNOSIS — R82998 Other abnormal findings in urine: Secondary | ICD-10-CM | POA: Diagnosis not present

## 2020-02-08 DIAGNOSIS — R300 Dysuria: Secondary | ICD-10-CM | POA: Diagnosis not present

## 2020-02-08 DIAGNOSIS — Z23 Encounter for immunization: Secondary | ICD-10-CM | POA: Diagnosis not present

## 2020-02-08 DIAGNOSIS — E559 Vitamin D deficiency, unspecified: Secondary | ICD-10-CM | POA: Diagnosis not present

## 2020-02-22 IMAGING — DX DG ABDOMEN 1V
3 series · 3 of 3 positions shown · non-contrast
Comparison: None.

CLINICAL DATA: Fecal impaction

EXAM:
ABDOMEN - 1 VIEW

[abdomen kub (1 of 3)]
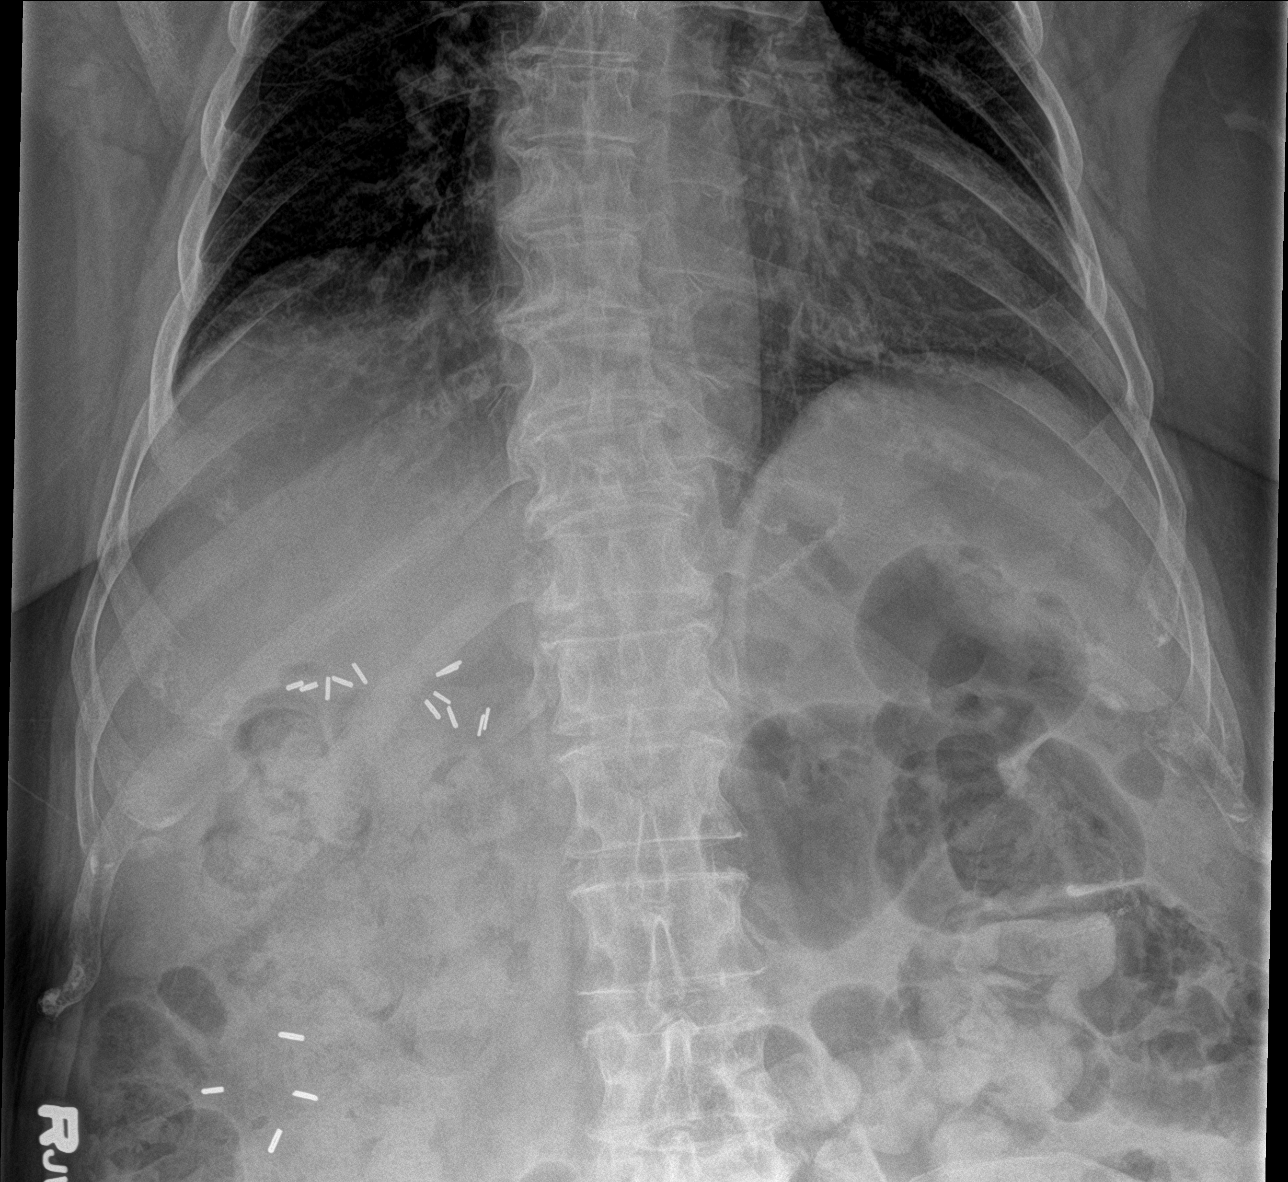

[abdomen kub (2 of 3)]
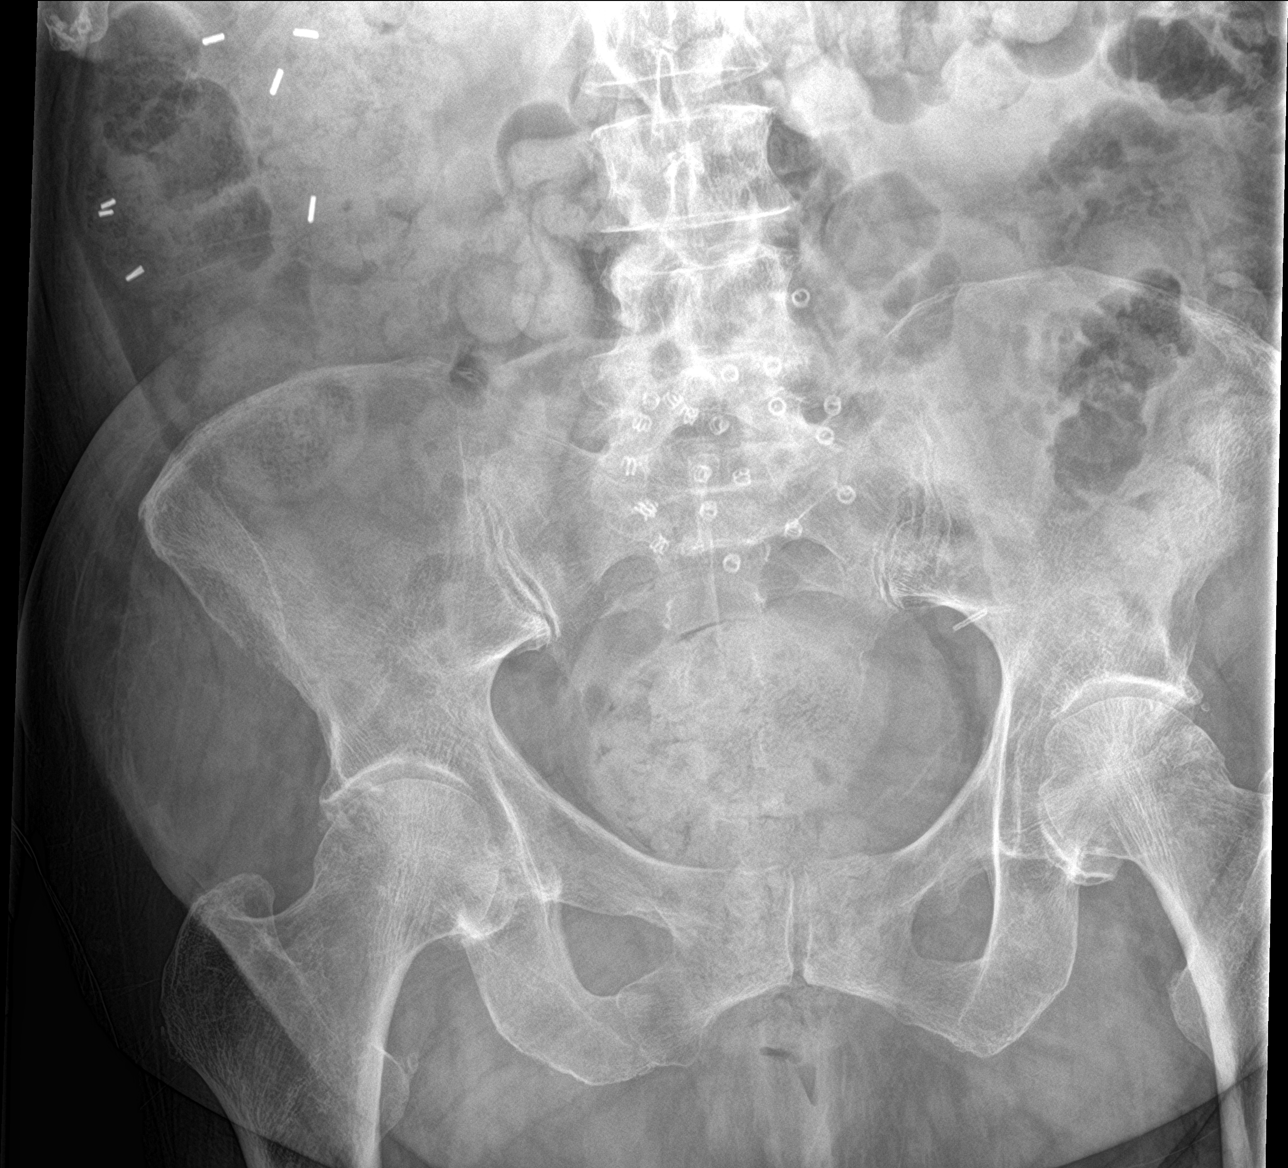

[abdomen kub (3 of 3)]
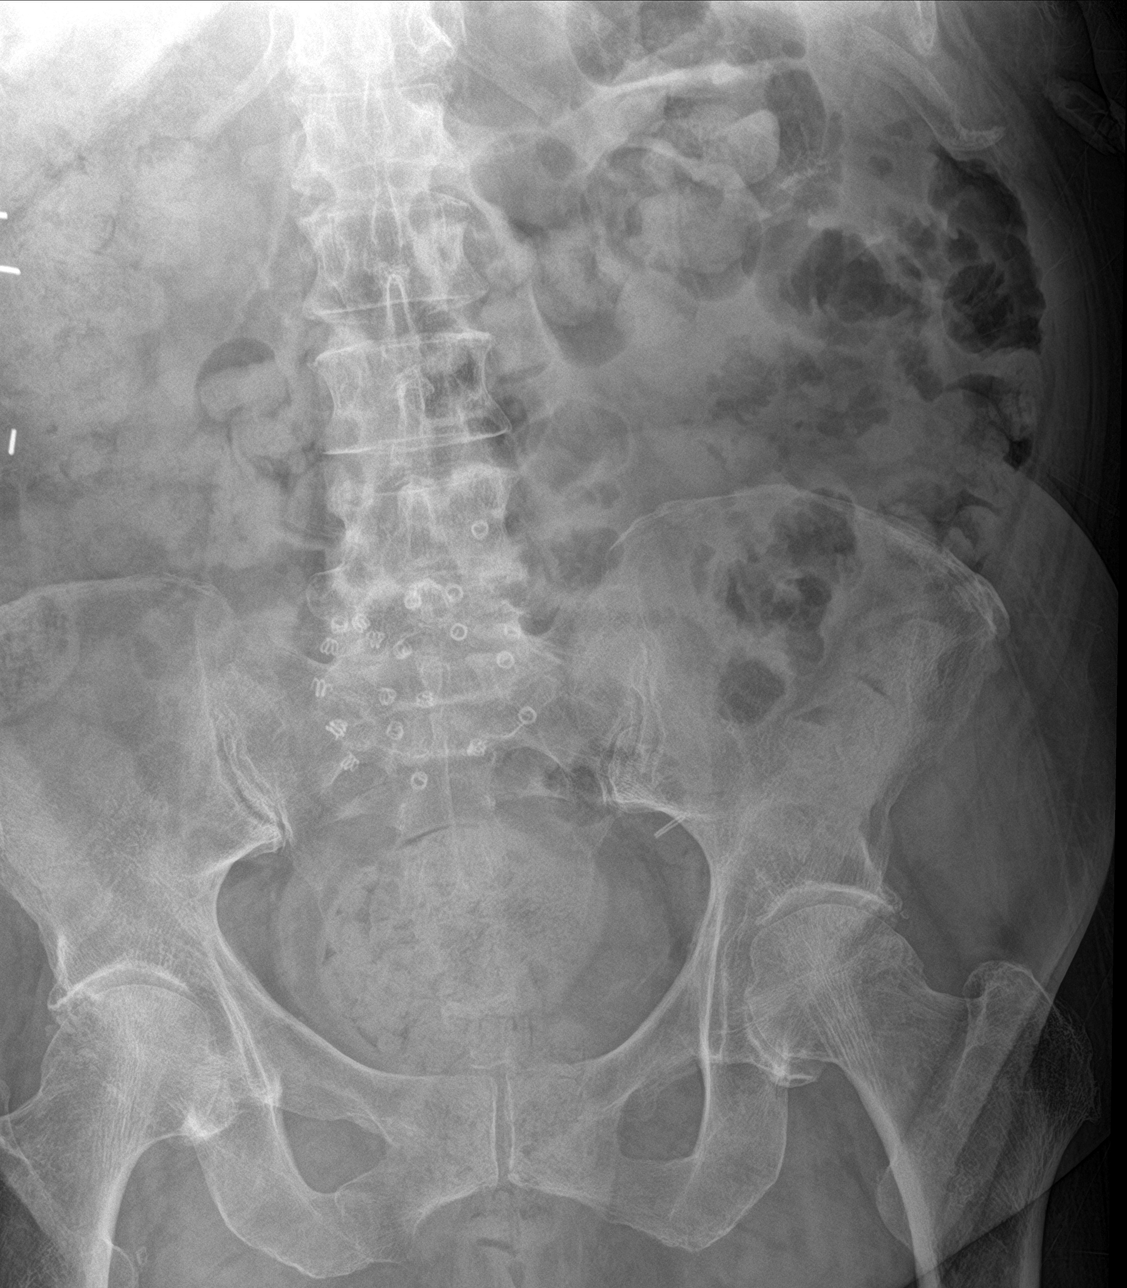

[3 of 3 positions shown; findings below may reference images not displayed]

FINDINGS: Large amount of stool within the rectum. There is stool throughout
the remainder of the colon. Prominent loops of small bowel
demonstrated throughout the abdomen. Lung bases are clear. Surgical
clips within the right hemiabdomen. Postsurgical change central
lower abdomen. Lumbar spine degenerative changes.
IMPRESSION: Large amount of stool within the rectum. Additionally there is a
large amount of stool within the cecum and ascending colon
compatible with associated constipation.

Prominent gaseous distended loops of small bowel.

## 2020-03-02 DIAGNOSIS — R112 Nausea with vomiting, unspecified: Secondary | ICD-10-CM | POA: Diagnosis not present

## 2020-03-02 DIAGNOSIS — E559 Vitamin D deficiency, unspecified: Secondary | ICD-10-CM | POA: Diagnosis not present

## 2020-03-02 DIAGNOSIS — Z87891 Personal history of nicotine dependence: Secondary | ICD-10-CM | POA: Diagnosis not present

## 2020-03-02 DIAGNOSIS — J9811 Atelectasis: Secondary | ICD-10-CM | POA: Diagnosis not present

## 2020-03-02 DIAGNOSIS — Z9884 Bariatric surgery status: Secondary | ICD-10-CM | POA: Diagnosis not present

## 2020-03-02 DIAGNOSIS — R197 Diarrhea, unspecified: Secondary | ICD-10-CM | POA: Diagnosis not present

## 2020-03-02 DIAGNOSIS — R918 Other nonspecific abnormal finding of lung field: Secondary | ICD-10-CM | POA: Diagnosis not present

## 2020-03-02 DIAGNOSIS — S20211A Contusion of right front wall of thorax, initial encounter: Secondary | ICD-10-CM | POA: Diagnosis not present

## 2020-03-02 DIAGNOSIS — Z79899 Other long term (current) drug therapy: Secondary | ICD-10-CM | POA: Diagnosis not present

## 2020-03-02 DIAGNOSIS — R109 Unspecified abdominal pain: Secondary | ICD-10-CM | POA: Diagnosis not present

## 2020-03-02 DIAGNOSIS — Z7984 Long term (current) use of oral hypoglycemic drugs: Secondary | ICD-10-CM | POA: Diagnosis not present

## 2020-03-02 DIAGNOSIS — R1011 Right upper quadrant pain: Secondary | ICD-10-CM | POA: Diagnosis not present

## 2020-03-02 DIAGNOSIS — E785 Hyperlipidemia, unspecified: Secondary | ICD-10-CM | POA: Diagnosis not present

## 2020-03-02 DIAGNOSIS — E538 Deficiency of other specified B group vitamins: Secondary | ICD-10-CM | POA: Diagnosis not present

## 2020-03-02 DIAGNOSIS — E119 Type 2 diabetes mellitus without complications: Secondary | ICD-10-CM | POA: Diagnosis not present

## 2020-04-30 DIAGNOSIS — E042 Nontoxic multinodular goiter: Secondary | ICD-10-CM | POA: Diagnosis not present

## 2020-05-30 ENCOUNTER — Other Ambulatory Visit: Payer: Self-pay | Admitting: Family Medicine

## 2020-05-30 DIAGNOSIS — F3342 Major depressive disorder, recurrent, in full remission: Secondary | ICD-10-CM

## 2022-02-16 ENCOUNTER — Telehealth: Payer: Self-pay | Admitting: Family Medicine

## 2022-02-16 NOTE — Telephone Encounter (Signed)
Pt need advice about pneumococcal vaccine and if she needs to get another one/ pt has moved away but wanted to get some advice

## 2022-02-16 NOTE — Telephone Encounter (Unsigned)
Copied from [facility] [id number]. Topic: General - Other >> Feb 16, 2022  1:51 PM Chapman Fitch wrote: Reason for CRM: pt asked if a copy of her immunization record can be emailed to her / please advise/ her email is [email or web address]
